# Patient Record
Sex: Female | Born: 1995 | Race: Black or African American | Hispanic: No | Marital: Single | Smoking: Current every day smoker
Health system: Southern US, Community
[De-identification: ages and names within clinical notes are randomized; demographics above are authoritative.]

## PROBLEM LIST (undated history)

## (undated) ENCOUNTER — Ambulatory Visit: Admission: EM | Payer: Medicaid Other

## (undated) ENCOUNTER — Ambulatory Visit (HOSPITAL_COMMUNITY): Admission: EM | Payer: MEDICAID

## (undated) DIAGNOSIS — A6000 Herpesviral infection of urogenital system, unspecified: Secondary | ICD-10-CM

## (undated) DIAGNOSIS — Z8619 Personal history of other infectious and parasitic diseases: Secondary | ICD-10-CM

## (undated) HISTORY — PX: NO PAST SURGERIES: SHX2092

---
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
## 2014-02-12 ENCOUNTER — Telehealth: Payer: Self-pay | Admitting: *Deleted

## 2014-02-12 ENCOUNTER — Ambulatory Visit: Payer: Medicaid Other

## 2014-02-12 NOTE — Telephone Encounter (Signed)
Left voice message for pt stating she will need to schedule an appt with PCP to restart Depo Provera.  Clovis PuMartin, Keidy Thurgood L, RN

## 2014-02-17 ENCOUNTER — Ambulatory Visit: Payer: Medicaid Other | Admitting: Family Medicine

## 2014-02-20 ENCOUNTER — Encounter (HOSPITAL_COMMUNITY): Payer: Self-pay | Admitting: Emergency Medicine

## 2014-02-20 ENCOUNTER — Emergency Department (INDEPENDENT_AMBULATORY_CARE_PROVIDER_SITE_OTHER)
Admission: EM | Admit: 2014-02-20 | Discharge: 2014-02-20 | Disposition: A | Discharge status: Home / Self Care | Payer: Self-pay | Source: Home / Self Care

## 2014-02-20 DIAGNOSIS — N390 Urinary tract infection, site not specified: Secondary | ICD-10-CM

## 2014-02-20 LAB — POCT PREGNANCY, URINE: Preg Test, Ur: NEGATIVE

## 2014-02-20 LAB — POCT URINALYSIS DIP (DEVICE)
BILIRUBIN URINE: NEGATIVE
GLUCOSE, UA: NEGATIVE mg/dL
Ketones, ur: NEGATIVE mg/dL
NITRITE: NEGATIVE
PH: 6 (ref 5.0–8.0)
Protein, ur: NEGATIVE mg/dL
Specific Gravity, Urine: 1.015 (ref 1.005–1.030)
Urobilinogen, UA: 0.2 mg/dL (ref 0.0–1.0)

## 2014-02-20 MED ORDER — CEPHALEXIN 500 MG PO CAPS: 500.0000 mg | ORAL_CAPSULE | Freq: Four times a day (QID) | ORAL | Status: DC

## 2014-02-20 NOTE — ED Notes (Signed)
Pt  Reports  Symptoms  Of  Scanty  Urinary  Output   With  Pain on  Urination   Today        -   Pt  Reports  Symptoms    Similar  To prior  uti   In past

## 2014-02-20 NOTE — Discharge Instructions (Signed)
Take all of medicine as directed, drink lots of fluids, see your doctor if further problems. °

## 2014-02-20 NOTE — ED Provider Notes (Signed)
CSN: [id number]     Arrival date & time 02/20/14  0840 History   None    Chief Complaint  Patient presents with  . Urinary Tract Infection   (Consider location/radiation/quality/duration/timing/severity/associated sxs/prior Treatment) Patient is a 18 y.o. female presenting with urinary tract infection. The history is provided by the patient.  Urinary Tract Infection This is a new problem. The current episode started 3 to 5 hours ago. The problem has not changed since onset.Pertinent negatives include no chest pain, no abdominal pain, no headaches and no shortness of breath.    Past Medical History  Diagnosis Date  . BV (bacterial vaginosis) 11/12/ 2010  . Yeast infection    History reviewed. No pertinent past surgical history. Family History  Problem Relation Age of Onset  . Asthma Mother    History  Substance Use Topics  . Smoking status: Never Smoker   . Smokeless tobacco: Not on file  . Alcohol Use: No   OB History   Grav Para Term Preterm Abortions TAB SAB Ect Mult Living                 Review of Systems  Constitutional: Negative.   Respiratory: Negative for shortness of breath.   Cardiovascular: Negative for chest pain.  Gastrointestinal: Negative.  Negative for abdominal pain.  Genitourinary: Positive for dysuria, urgency and frequency. Negative for flank pain, vaginal discharge and pelvic pain.  Neurological: Negative for headaches.    Allergies  Review of patient's allergies indicates no known allergies.  Home Medications   Prior to Admission medications   Medication Sig Start Date End Date Taking? Authorizing Provider  azithromycin (ZITHROMAX) 500 MG tablet Take 2 tablets (1,000 mg total) by mouth once. Take first 2 tablets together, then 1 every day until finished. 08/16/13   Rodolph Bong, MD  cephALEXin (KEFLEX) 500 MG capsule Take 1 capsule (500 mg total) by mouth 4 (four) times daily. Take all of medicine and drink lots of fluids 02/20/14   Linna Hoff,  MD  fluconazole (DIFLUCAN) 150 MG tablet Take 1 tablet (150 mg total) by mouth once. 11/28/12   Everrett Coombe, DO  medroxyPROGESTERone (DEPO-PROVERA) 150 MG/ML injection Inject 1 mL (150 mg total) into the muscle every 3 (three) months. 03/04/12   Henreitta Leber, PA-C  metroNIDAZOLE (FLAGYL) 500 MG tablet Take 1 tablet (500 mg total) by mouth 2 (two) times daily. 08/13/13   Rodolph Bong, MD  triamcinolone (KENALOG) 0.1 % cream Apply topically 2 (two) times daily. to affected areas     Historical Provider, MD   BP 101/77  Pulse 70  Temp(Src) 97.8 F (36.6 C) (Oral)  Resp 12  SpO2 100% Physical Exam  Nursing note and vitals reviewed. Constitutional: She is oriented to person, place, and time. She appears well-developed and well-nourished.  Neck: Normal range of motion. Neck supple.  Abdominal: Soft. Bowel sounds are normal. She exhibits no mass. There is no tenderness. There is no rebound and no guarding.  Neurological: She is alert and oriented to person, place, and time.  Skin: Skin is warm and dry.    ED Course  Procedures (including critical care time) Labs Review Labs Reviewed  POCT URINALYSIS DIP (DEVICE) - Abnormal; Notable for the following:    Hgb urine dipstick TRACE (*)    Leukocytes, UA MODERATE (*)    All other components within normal limits  POCT PREGNANCY, URINE    Imaging Review No results found.   MDM   1.  UTI (lower urinary tract infection)        Linna HoffJames D Rakisha Pincock, MD 02/20/14 [phone]

## 2014-02-26 ENCOUNTER — Ambulatory Visit: Payer: Self-pay | Admitting: Family Medicine

## 2014-06-16 ENCOUNTER — Emergency Department (INDEPENDENT_AMBULATORY_CARE_PROVIDER_SITE_OTHER)
Admission: EM | Admit: 2014-06-16 | Discharge: 2014-06-16 | Disposition: A | Discharge status: Home / Self Care | Payer: Self-pay | Source: Home / Self Care | Attending: Emergency Medicine | Admitting: Emergency Medicine

## 2014-06-16 ENCOUNTER — Encounter (HOSPITAL_COMMUNITY): Payer: Self-pay | Admitting: Emergency Medicine

## 2014-06-16 DIAGNOSIS — B348 Other viral infections of unspecified site: Secondary | ICD-10-CM

## 2014-06-16 MED ORDER — TRAMADOL HCL 50 MG PO TABS: ORAL_TABLET | ORAL | Status: DC

## 2014-06-16 MED ORDER — IPRATROPIUM BROMIDE 0.06 % NA SOLN: 2.0000 | Freq: Four times a day (QID) | NASAL | Status: DC

## 2014-06-16 NOTE — Discharge Instructions (Signed)

## 2014-06-16 NOTE — ED Provider Notes (Signed)
  Chief Complaint   URI   History of Present Illness   Stacey Rodriguez is a 18 year old female with a 2 day history of cough rectum small amounts of yellow sputum, nasal congestion with a small amount of yellowish drainage, sinus pain, ear congestion, subjective fever, chills, and sweats. She denies any wheezing, chest pain, headache, sore throat, or GI symptoms. She's had no known sick exposures. She tried Alka-Seltzer at home without any improvement.  Review of Systems   Other than as noted above, the patient denies any of the following symptoms: Systemic:  No fevers, chills, sweats, or myalgias. Eye:  No redness or discharge. ENT:  No ear pain, headache, nasal congestion, drainage, sinus pressure, or sore throat. Neck:  No neck pain, stiffness, or swollen glands. Lungs:  No cough, sputum production, hemoptysis, wheezing, chest tightness, shortness of breath or chest pain. GI:  No abdominal pain, nausea, vomiting or diarrhea.  PMFSH   Past medical history, family history, social history, meds, and allergies were reviewed.   Physical exam   Vital signs:  BP 97/63 mmHg  Pulse 104  Temp(Src) 99.2 F (37.3 C) (Oral)  Resp 18  SpO2 100% General:  Alert and oriented.  In no distress.  Skin warm and dry. Eye:  No conjunctival injection or drainage. Lids were normal. ENT:  TMs and canals were normal, without erythema or inflammation.  Nasal mucosa was clear and uncongested, without drainage.  Mucous membranes were moist.  Pharynx was clear with no exudate or drainage.  There were no oral ulcerations or lesions. Neck:  Supple, no adenopathy, tenderness or mass. Lungs:  No respiratory distress.  Lungs were clear to auscultation, without wheezes, rales or rhonchi.  Breath sounds were clear and equal bilaterally.  Heart:  Regular rhythm, without gallops, murmers or rubs. Skin:  Clear, warm, and dry, without rash or lesions.   Assessment     The encounter diagnosis was  Rhinovirus.  There is no evidence of pneumonia, strep throat, sinusitis, otitis media.    Plan    1.  Meds:  The following meds were prescribed:   New Prescriptions   IPRATROPIUM (ATROVENT) 0.06 % NASAL SPRAY    Place 2 sprays into both nostrils 4 (four) times daily.   TRAMADOL (ULTRAM) 50 MG TABLET    1 to 2 tabs every 8 hours as needed for cough.    2.  Patient Education/Counseling:  The patient was given appropriate handouts, self care instructions, and instructed in symptomatic relief.  Instructed to get extra fluids and extra rest.    3.  Follow up:  The patient was told to follow up here if no better in 3 to 4 days, or sooner if becoming worse in any way, and given some red flag symptoms such as increasing fever, difficulty breathing, chest pain, or persistent vomiting which would prompt immediate return.       Reuben Likesavid C Ananda Caya, MD 06/16/14 [phone]

## 2014-06-16 NOTE — ED Notes (Signed)
Onset yesterday of cough and yellow phlegm, today sneezing, runny nose.  Ears hurt when lying down, no sore throat.

## 2014-07-02 ENCOUNTER — Encounter (HOSPITAL_COMMUNITY): Payer: Self-pay | Admitting: Emergency Medicine

## 2014-07-02 DIAGNOSIS — Y998 Other external cause status: Secondary | ICD-10-CM | POA: Insufficient documentation

## 2014-07-02 DIAGNOSIS — Z792 Long term (current) use of antibiotics: Secondary | ICD-10-CM | POA: Insufficient documentation

## 2014-07-02 DIAGNOSIS — Y9301 Activity, walking, marching and hiking: Secondary | ICD-10-CM | POA: Insufficient documentation

## 2014-07-02 DIAGNOSIS — Z79899 Other long term (current) drug therapy: Secondary | ICD-10-CM | POA: Insufficient documentation

## 2014-07-02 DIAGNOSIS — S3992XA Unspecified injury of lower back, initial encounter: Secondary | ICD-10-CM | POA: Insufficient documentation

## 2014-07-02 DIAGNOSIS — S0012XA Contusion of left eyelid and periocular area, initial encounter: Secondary | ICD-10-CM | POA: Insufficient documentation

## 2014-07-02 DIAGNOSIS — Z8619 Personal history of other infectious and parasitic diseases: Secondary | ICD-10-CM | POA: Insufficient documentation

## 2014-07-02 DIAGNOSIS — Y9289 Other specified places as the place of occurrence of the external cause: Secondary | ICD-10-CM | POA: Insufficient documentation

## 2014-07-02 MED ORDER — OXYCODONE-ACETAMINOPHEN 5-325 MG PO TABS
1.0000 | ORAL_TABLET | Freq: Once | ORAL | Status: AC
  Administered 2014-07-02: 1 via ORAL
  Filled 2014-07-02: qty 1
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
## 2014-07-03 ENCOUNTER — Emergency Department (HOSPITAL_COMMUNITY)
Admission: EM | Admit: 2014-07-03 | Discharge: 2014-07-03 | Disposition: A | Discharge status: Home / Self Care | Payer: Self-pay | Attending: Emergency Medicine | Admitting: Emergency Medicine

## 2014-07-03 ENCOUNTER — Emergency Department (HOSPITAL_COMMUNITY): Payer: Self-pay

## 2014-07-03 DIAGNOSIS — H0520 Unspecified exophthalmos: Secondary | ICD-10-CM

## 2014-07-03 DIAGNOSIS — H05232 Hemorrhage of left orbit: Secondary | ICD-10-CM

## 2014-07-03 MED ORDER — HYDROCODONE-ACETAMINOPHEN 5-325 MG PO TABS: 1.0000 | ORAL_TABLET | ORAL | Status: DC | PRN
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
## 2014-07-03 NOTE — Discharge Instructions (Signed)

## 2014-07-03 NOTE — ED Notes (Signed)
CSI at bedside to take photos
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
## 2014-07-03 NOTE — ED Notes (Signed)
[employer] remains at bedside. 

## 2015-04-15 ENCOUNTER — Emergency Department (HOSPITAL_COMMUNITY)
Admission: EM | Admit: 2015-04-15 | Discharge: 2015-04-15 | Disposition: A | Discharge status: Home / Self Care | Payer: Self-pay | Attending: Emergency Medicine | Admitting: Emergency Medicine

## 2015-04-15 ENCOUNTER — Encounter (HOSPITAL_COMMUNITY): Payer: Self-pay | Admitting: General Practice

## 2015-04-15 DIAGNOSIS — Z79899 Other long term (current) drug therapy: Secondary | ICD-10-CM | POA: Insufficient documentation

## 2015-04-15 DIAGNOSIS — Z3202 Encounter for pregnancy test, result negative: Secondary | ICD-10-CM | POA: Insufficient documentation

## 2015-04-15 DIAGNOSIS — N898 Other specified noninflammatory disorders of vagina: Secondary | ICD-10-CM | POA: Insufficient documentation

## 2015-04-15 DIAGNOSIS — N390 Urinary tract infection, site not specified: Secondary | ICD-10-CM | POA: Insufficient documentation

## 2015-04-15 DIAGNOSIS — Z8619 Personal history of other infectious and parasitic diseases: Secondary | ICD-10-CM | POA: Insufficient documentation

## 2015-04-15 LAB — URINE MICROSCOPIC-ADD ON

## 2015-04-15 LAB — URINALYSIS, ROUTINE W REFLEX MICROSCOPIC
BILIRUBIN URINE: NEGATIVE
Glucose, UA: NEGATIVE mg/dL
Ketones, ur: 15 mg/dL — AB
NITRITE: NEGATIVE
PROTEIN: 30 mg/dL — AB
SPECIFIC GRAVITY, URINE: 1.024 (ref 1.005–1.030)
UROBILINOGEN UA: 1 mg/dL (ref 0.0–1.0)
pH: 6 (ref 5.0–8.0)

## 2015-04-15 LAB — WET PREP, GENITAL
CLUE CELLS WET PREP: NONE SEEN
Trich, Wet Prep: NONE SEEN
Yeast Wet Prep HPF POC: NONE SEEN

## 2015-04-15 LAB — I-STAT BETA HCG BLOOD, ED (MC, WL, AP ONLY)

## 2015-04-15 LAB — HIV ANTIBODY (ROUTINE TESTING W REFLEX): HIV Screen 4th Generation wRfx: NONREACTIVE

## 2015-04-15 MED ORDER — ONDANSETRON 4 MG PO TBDP
4.0000 mg | ORAL_TABLET | Freq: Once | ORAL | Status: AC
  Administered 2015-04-15: 4 mg via ORAL
  Filled 2015-04-15: qty 1

## 2015-04-15 MED ORDER — AZITHROMYCIN 250 MG PO TABS
1000.0000 mg | ORAL_TABLET | Freq: Once | ORAL | Status: AC
  Administered 2015-04-15: 1000 mg via ORAL
  Filled 2015-04-15: qty 4

## 2015-04-15 MED ORDER — CEPHALEXIN 250 MG PO CAPS
500.0000 mg | ORAL_CAPSULE | Freq: Once | ORAL | Status: AC
  Administered 2015-04-15: 500 mg via ORAL
  Filled 2015-04-15 (×2): qty 2

## 2015-04-15 MED ORDER — CEFTRIAXONE SODIUM 250 MG IJ SOLR
250.0000 mg | Freq: Once | INTRAMUSCULAR | Status: AC
  Administered 2015-04-15: 250 mg via INTRAMUSCULAR
  Filled 2015-04-15: qty 250

## 2015-04-15 MED ORDER — LIDOCAINE HCL (PF) 1 % IJ SOLN
5.0000 mL | Freq: Once | INTRAMUSCULAR | Status: AC
  Administered 2015-04-15: 5 mL
  Filled 2015-04-15: qty 5

## 2015-04-15 MED ORDER — CEPHALEXIN 500 MG PO CAPS: 500.0000 mg | ORAL_CAPSULE | Freq: Four times a day (QID) | ORAL | Status: DC

## 2015-04-15 NOTE — ED Notes (Signed)
Pt brought in complaining of vaginal pain that started on 04/09/2015. Pt reports thick yellow discharge. Pt denies fever, chills, N/V. Pt last had intercouse on 04/07/15.

## 2015-04-15 NOTE — Discharge Instructions (Signed)
You have been treated here for gonorrhea and chlamydia. Take keflex as directed until gone for your UTI. Refer to attached documents for more information.

## 2015-04-15 NOTE — ED Provider Notes (Signed)
CSN: [id number]     Arrival date & time 04/15/15  0746 History   First MD Initiated Contact with Patient 04/15/15 [phone]     Chief Complaint  Patient presents with  . Vaginal Pain     (Consider location/radiation/quality/duration/timing/severity/associated sxs/prior Treatment) Patient is a 19 y.o. female presenting with vaginal discharge. The history is provided by the patient. No language interpreter was used.  Vaginal Discharge Quality:  Yellow Severity:  Moderate Onset quality:  Gradual Duration:  1 week Timing:  Constant Progression:  Unchanged Chronicity:  New Context: after intercourse   Context: not after urination, not at rest, not during pregnancy and not recent antibiotic use   Relieved by:  Nothing Worsened by:  Nothing tried Ineffective treatments:  None tried Associated symptoms: no abdominal pain, no genital lesions, no nausea, no rash, no urinary hesitancy, no vaginal itching and no vomiting   Risk factors: no endometriosis, no foreign body, no gynecological surgery, no immunosuppression, no new sexual partner, no PID, no prior miscarriage, no STI exposure and no terminated pregnancy     Past Medical History  Diagnosis Date  . BV (bacterial vaginosis) 11/12/ 2010  . Yeast infection    History reviewed. No pertinent past surgical history. Family History  Problem Relation Age of Onset  . Asthma Mother    Social History  Substance Use Topics  . Smoking status: Never Smoker   . Smokeless tobacco: None  . Alcohol Use: 3.0 oz/week    5 Shots of liquor per week   OB History    No data available     Review of Systems  Gastrointestinal: Negative for nausea, vomiting and abdominal pain.  Genitourinary: Positive for vaginal discharge and vaginal pain. Negative for hesitancy.  All other systems reviewed and are negative.     Allergies  Review of patient's allergies indicates no known allergies.  Home Medications   Prior to Admission medications    Medication Sig Start Date End Date Taking? Authorizing Provider  azithromycin (ZITHROMAX) 500 MG tablet Take 2 tablets (1,000 mg total) by mouth once. Take first 2 tablets together, then 1 every day until finished. Patient not taking: Reported on 04/15/2015 08/16/13   Rodolph Bong, MD  cephALEXin (KEFLEX) 500 MG capsule Take 1 capsule (500 mg total) by mouth 4 (four) times daily. Take all of medicine and drink lots of fluids Patient not taking: Reported on 04/15/2015 02/20/14   Linna Hoff, MD  fluconazole (DIFLUCAN) 150 MG tablet Take 1 tablet (150 mg total) by mouth once. Patient not taking: Reported on 04/15/2015 11/28/12   Everrett Coombe, DO  HYDROcodone-acetaminophen (NORCO/VICODIN) 5-325 MG per tablet Take 1 tablet by mouth every 4 (four) hours as needed for moderate pain. Patient not taking: Reported on 04/15/2015 07/03/14   Azalia Bilis, MD  ipratropium (ATROVENT) 0.06 % nasal spray Place 2 sprays into both nostrils 4 (four) times daily. Patient not taking: Reported on 04/15/2015 06/16/14   Reuben Likes, MD  medroxyPROGESTERone (DEPO-PROVERA) 150 MG/ML injection Inject 1 mL (150 mg total) into the muscle every 3 (three) months. Patient not taking: Reported on 04/15/2015 03/04/12   Henreitta Leber, PA-C  metroNIDAZOLE (FLAGYL) 500 MG tablet Take 1 tablet (500 mg total) by mouth 2 (two) times daily. Patient not taking: Reported on 04/15/2015 08/13/13   Rodolph Bong, MD  traMADol (ULTRAM) 50 MG tablet 1 to 2 tabs every 8 hours as needed for cough. Patient not taking: Reported on 04/15/2015 06/16/14   Dineen Kid  Lorenz Coaster, MD  triamcinolone (KENALOG) 0.1 % cream Apply topically 2 (two) times daily. to affected areas     Historical Provider, MD   BP 109/67 mmHg  Pulse 84  Temp(Src) 98.5 F (36.9 C) (Oral)  Resp 16  Ht 5\' 7"  (1.702 m)  Wt 141 lb (63.957 kg)  BMI 22.08 kg/m2  SpO2 98%  LMP  (Within Months) Physical Exam  Constitutional: She is oriented to person, place, and time. She appears well-developed and  well-nourished. No distress.  HENT:  Head: Normocephalic and atraumatic.  Eyes: Conjunctivae and EOM are normal.  Neck: Normal range of motion.  Cardiovascular: Normal rate and regular rhythm.  Exam reveals no gallop and no friction rub.   No murmur heard. Pulmonary/Chest: Effort normal and breath sounds normal. She has no wheezes. She has no rales. She exhibits no tenderness.  Abdominal: Soft. She exhibits no distension. There is no tenderness. There is no rebound.  Genitourinary: Vagina normal.  Moderate amount of thick, yellowish discharge in vagina. Cervical os closed. No CMT.   Musculoskeletal: Normal range of motion.  Neurological: She is alert and oriented to person, place, and time. Coordination normal.  Speech is goal-oriented. Moves limbs without ataxia.   Skin: Skin is warm and dry.  Psychiatric: She has a normal mood and affect. Her behavior is normal.  Nursing note and vitals reviewed.   ED Course  Procedures (including critical care time) Labs Review Labs Reviewed  WET PREP, GENITAL - Abnormal; Notable for the following:    WBC, Wet Prep HPF POC TOO NUMEROUS TO COUNT (*)    All other components within normal limits  URINALYSIS, ROUTINE W REFLEX MICROSCOPIC (NOT AT [facility]) - Abnormal; Notable for the following:    Color, Urine AMBER (*)    APPearance TURBID (*)    Hgb urine dipstick SMALL (*)    Ketones, ur 15 (*)    Protein, ur 30 (*)    Leukocytes, UA LARGE (*)    All other components within normal limits  URINE MICROSCOPIC-ADD ON - Abnormal; Notable for the following:    Squamous Epithelial / LPF MANY (*)    Bacteria, UA MANY (*)    All other components within normal limits  HIV ANTIBODY (ROUTINE TESTING)  I-STAT BETA HCG BLOOD, ED ([facility], [facility], [facility] ONLY)  GC/CHLAMYDIA PROBE AMP ([place]) NOT AT [facility]    Imaging Review No results found. I have personally reviewed and evaluated these images and lab results as part of my medical decision-making.   EKG  Interpretation None      MDM   Final diagnoses:  Vaginal discharge  UTI (lower urinary tract infection)    9:13 AM Pelvic done. Wet prep and urinalysis pending.   10:36 AM Wet prep shows TNTC WBC without other acute changes. Patient will be treated for GC/Chlamydia. Urinalysis shows UTI and patient will be treated with keflex.    Emilia Beck, PA-C 04/15/15 1037  Cathren Laine, MD 04/16/15 [phone]

## 2015-04-16 LAB — GC/CHLAMYDIA PROBE AMP (~~LOC~~) NOT AT ARMC
CHLAMYDIA, DNA PROBE: NEGATIVE
NEISSERIA GONORRHEA: NEGATIVE

## 2015-05-18 ENCOUNTER — Ambulatory Visit: Payer: Self-pay | Admitting: Internal Medicine

## 2015-05-28 ENCOUNTER — Ambulatory Visit: Payer: Self-pay | Admitting: Internal Medicine

## 2015-06-26 ENCOUNTER — Encounter (HOSPITAL_COMMUNITY): Payer: Self-pay | Admitting: Emergency Medicine

## 2015-06-26 ENCOUNTER — Emergency Department (HOSPITAL_COMMUNITY)
Admission: EM | Admit: 2015-06-26 | Discharge: 2015-06-26 | Disposition: A | Discharge status: Home / Self Care | Payer: Medicaid Other | Attending: Emergency Medicine | Admitting: Emergency Medicine

## 2015-06-26 DIAGNOSIS — B373 Candidiasis of vulva and vagina: Secondary | ICD-10-CM | POA: Insufficient documentation

## 2015-06-26 DIAGNOSIS — B3731 Acute candidiasis of vulva and vagina: Secondary | ICD-10-CM

## 2015-06-26 DIAGNOSIS — Z792 Long term (current) use of antibiotics: Secondary | ICD-10-CM | POA: Insufficient documentation

## 2015-06-26 DIAGNOSIS — Z793 Long term (current) use of hormonal contraceptives: Secondary | ICD-10-CM | POA: Insufficient documentation

## 2015-06-26 DIAGNOSIS — Z3202 Encounter for pregnancy test, result negative: Secondary | ICD-10-CM | POA: Insufficient documentation

## 2015-06-26 LAB — WET PREP, GENITAL
Clue Cells Wet Prep HPF POC: NONE SEEN
Trich, Wet Prep: NONE SEEN

## 2015-06-26 LAB — URINALYSIS, ROUTINE W REFLEX MICROSCOPIC
Bilirubin Urine: NEGATIVE
GLUCOSE, UA: NEGATIVE mg/dL
Hgb urine dipstick: NEGATIVE
Ketones, ur: NEGATIVE mg/dL
Nitrite: NEGATIVE
PROTEIN: NEGATIVE mg/dL
Specific Gravity, Urine: 1.018 (ref 1.005–1.030)
UROBILINOGEN UA: 0.2 mg/dL (ref 0.0–1.0)
pH: 5.5 (ref 5.0–8.0)

## 2015-06-26 LAB — PREGNANCY, URINE: Preg Test, Ur: NEGATIVE

## 2015-06-26 LAB — URINE MICROSCOPIC-ADD ON

## 2015-06-26 MED ORDER — FLUCONAZOLE 100 MG PO TABS
150.0000 mg | ORAL_TABLET | Freq: Every day | ORAL | Status: DC
  Administered 2015-06-26: 150 mg via ORAL
  Filled 2015-06-26: qty 2

## 2015-06-26 NOTE — ED Notes (Signed)
Pt remains monitored by blood pressure and pulse ox.  

## 2015-06-26 NOTE — ED Notes (Signed)
Vaginal discharge for two days and vaginal itching. Wants to be checked for STDs.

## 2015-06-26 NOTE — Discharge Instructions (Signed)
Please follow up with a primary care provider from the Resource Guide provided below in 1 week. Please return to the Emergency Department if symptoms worsen or new onset of fever, abdominal pain, vaginal bleeding, vaginal discharge, vomiting.    Emergency Department Resource Guide 1) Find a Doctor and Pay Out of Pocket Although you won't have to find out who is covered by your insurance plan, it is a good idea to ask around and get recommendations. You will then need to call the office and see if the doctor you have chosen will accept you as a new patient and what types of options they offer for patients who are self-pay. Some doctors offer discounts or will set up payment plans for their patients who do not have insurance, but you will need to ask so you aren't surprised when you get to your appointment.  2) Contact Your Local Health Department Not all health departments have doctors that can see patients for sick visits, but many do, so it is worth a call to see if yours does. If you don't know where your local health department is, you can check in your phone book. The CDC also has a tool to help you locate your state's health department, and many state websites also have listings of all of their local health departments.  3) Find a Walk-in Clinic If your illness is not likely to be very severe or complicated, you may want to try a walk in clinic. These are popping up all over the country in pharmacies, drugstores, and shopping centers. They're usually staffed by nurse practitioners or physician assistants that have been trained to treat common illnesses and complaints. They're usually fairly quick and inexpensive. However, if you have serious medical issues or chronic medical problems, these are probably not your best option.  No Primary Care Doctor: - Call Health Connect at  [phone] - they can help you locate a primary care doctor that  accepts your insurance, provides certain services,  etc. - Physician Referral Service- [phone]  Chronic Pain Problems: Organization         Address  Phone   Notes  [facility]  [phone] Patients need to be referred by their primary care doctor.   Medication Assistance: Organization         Address  Phone   Notes  [facility] [address] [place], [place] [address] [phone] --Must be a resident of [facility] [place] -- Must have NO insurance coverage whatsoever (no Medicaid/ Medicare, etc.) -- The pt. MUST have a primary care doctor that directs their care regularly and follows them in the community   MedAssist  [phone]   [employer]  [phone]    Agencies that provide inexpensive medical care: Organization         Address  Phone   Notes  [facility]  [phone]   [facility]    [phone]   [facility] [address] [place], [place] [address] [phone]   [facility] [address], [place] [phone]   [facility]anned Parenthood    [phone]   [facility]    [phone]   [facility]  [address], [place] Phone:  [phone], Fax:  [phone] Hours of Operation:  9 am - 6 pm, M-F.  Also accepts Medicaid/Medicare and self-pay.  [facility] [place] [address], [address], [place] Phone: [phone], Fax: [phone]. Hours of Operation:  8:30 am - 5:30 pm, M-F.  Also accepts Medicaid and self-pay.  [facility] [address] [place] Phone: [phone]   [place], [place], [place] [phone], Ext. [phone] Mondays & Thursdays: 7-9 AM.  First 15 patients are seen on a first come, first serve basis.    [place] Providers:  Organization         Address  Phone   Notes  [facility] [address], [place] [phone] Also accepts self-pay patients.  [facility] [address], [place]  [phone]   [place], [address], [place] [phone]   [facility] [address], [place] [phone]   Lucianne Lei [address], [place]   [phone] Only accepts [place] Access [place] patients after they have their name applied to their card.   Self-Pay (no insurance) in [facility]:  Organization         Address  Phone   Notes  Sickle Cell Patients, [facility] [place] [phone]   [facility] [place] [phone]   [facility] [place]  [place], [place], [place] [phone]   [facility]/Dr. Osei-Bonsu  [address] [place] or [address], [place] [phone] Phone number for both [place] and [place] locations is the same.  Urgent Medical and [facility] [address], [place] [phone]   [facility] [address], [place] or [address] [phone]   [facility] [address] [place] [phone], phone; [phone], fax Sees patients 1st and 3rd Saturday of every month.  Must not qualify for public or private insurance (i.e. Medicaid, Medicare, [place] Health Choice, Veterans' Benefits)  Household income should be no more than 200% of the poverty level The clinic cannot treat you if you are pregnant or think you are pregnant  Sexually transmitted diseases are not treated at the clinic.    Dental Care: Organization         Address  Phone  Notes  [facility] of [facility] [place] [phone] Accepts children up to  age 78 who are enrolled in [place] or [place]; pregnant women with a Medicaid card; and children who have applied for Medicaid or [place] Health Choice, but were declined, whose parents can pay a reduced fee at time of service.  [facility] of [facility]  [address], [place] [phone] Accepts children up to age 39 who are enrolled in [place] or [place]; pregnant women with a Medicaid card; and children who have applied for Medicaid or [place] Health Choice, but were declined, whose parents can pay a reduced fee at time of service.  [facility] Access PROGRAM  [place] [phone] Patients are seen by appointment only. Walk-ins are not accepted. [place] will see patients 78 years of age and older. Monday - Tuesday (8am-5pm) Most Wednesdays (8:30-5pm) $30 per  visit, cash only  [facility] Adult [employer] PROGRAM  [address], [facility] [phone] Patients are seen by appointment only. Walk-ins are not accepted. [place] will see patients 25 years of age and older. One Wednesday Evening (Monthly: Volunteer Based).  $30 per visit, cash only  [place]  [phone] for adults; Children under age 88, call Graduate Pediatric Dentistry at [phone]. Children aged 53-14, please call [phone] to request a pediatric application.  Dental services are provided in all areas of dental care including fillings, crowns and bridges, complete and partial dentures, implants, gum treatment, root canals, and extractions. Preventive care is also provided. Treatment is provided to both adults and children. Patients are selected via a lottery and there is often a waiting list.   [facility] [address] [place]  [phone] [email or web address]   [facility] [address] [place], [place] [phone], Ext. [phone] Second and Fourth Thursday of  each month, opens at 6:30 AM; Clinic ends at 9 AM.  Patients are seen on a first-come first-served basis, and a limited number are seen during each clinic.   [facility]  [address] Hillard Danker [place], [place] [phone]   Eligibility Requirements You must have lived in [place], [place], or [place] counties for at least the last three months.   You cannot be eligible for state or federal sponsored [employer], including [employer], [place], or [employer].   You generally cannot be eligible for healthcare insurance through your employer.    How to apply: Eligibility screenings are held every Tuesday and Wednesday afternoon from 1:00 pm until 4:00 pm. You do not need an appointment for the interview!  [facility] [address], [place], [place]   [place]  [place] Department  [place]  [phone]    Behavioral Health Resources in the Community: Intensive Outpatient Programs Organization         Address  Phone  Notes  [place] [address] [place], [place] [phone]   [facility] [address] [place], [place]   ADS: Alcohol & Drug Svcs [address] [place], [place]   [place] [address],  [place], [place] or [phone]   Substance Abuse Resources Organization         Address  Phone  Notes  Alcohol and Drug Services  [phone]   [place]  [phone]   [facility] [place]   Chinita Pester  [phone]   Residential & Outpatient Substance Abuse Program  [phone]   Psychological Services Organization         Address  Phone  Notes  [facility]  [place]  [phone]   [place] [address],  [place] or [phone]    Mobile Crisis Teams Organization         Address  Phone  Notes  [facility], Mobile Crisis Care Unit  [phone]   [facility]  [address] [place], [place]   [place] [address], [place] [phone]    Self-Help/Support Groups Organization         Address  Phone  Notes  [facility] [place] - variety of support groups  [phone]- [phone] Call for more information  Narcotics Anonymous (NA), [facility] [address], [employer] [place]  2 meetings at this location   Statistician         Address  Phone  Notes  [facility] [address],    [place]  [phone]   [facility]  [address], [place] [address], [place], [place] [phone]   [facility] [address] [place] [address] [place], [place] [phone] Admissions: 8am-3pm M-F  [facility] [address]    [place], [place] [phone]   [facility] [address] [place], [place], [place] [phone]   [facility] [address],  [place], [place] [phone]   [facility] - Intensive Outpatient [address]urell [address], [place], [place] [phone]   [facility] (Addiction Recovery Care [facility]oc[facility] [address] [place]  [place], [place] [phone] or [phone]   [facility] Services (RTS) [address] [place], [place] [phone] Accepts Medicaid  [facility] [place] [address],  [place] [phone] Substance Abuse/Addiction Treatment   [facility] Organization         Address  Phone  Notes  [facility]  [phone]   Angie Fava, PhD [address] Ervin Knack [place], [place]   [phone] or [phone]   [facility] Behavioral   [address] [place], [place] [phone]     [facility] [address], [place], [place] [phone] Insurance/Medicaid/sponsorship through [facility] [address]                                    [place], [place] [phone] Therapy/tele-psych/case  [facility] [address], [place] [phone]    Dr. Lolly Mustache  [phone]   [facility]  [employer] [facility] Dept. 1) [address] [place] 2) [address], [place] 3)  [address], [place] [phone]  [phone]   [facility] Child Abuse Hotline [phone]) [phone] or [phone] (After Hours)

## 2015-06-26 NOTE — ED Provider Notes (Signed)
CSN: [id number]     Arrival date & time 06/26/15  0957 History   First MD Initiated Contact with Patient 06/26/15 1023     Chief Complaint  Patient presents with  . Vaginal Discharge     (Consider location/radiation/quality/duration/timing/severity/associated sxs/prior Treatment) HPI Comments: Patient is a 19 year old female who presents the ED with complaint of vaginal discharge, onset 2 days. Patient states she was treated a week ago for BV and notes that she did not finish her prescription of Flagyl. She states she was diagnosed with BV she also had vaginal discharge but states that her discharge today is different. Patient also endorses having vaginal itching. Denies fever, chills, abdominal pain, nausea, vomiting, vaginal bleeding, urinary symptoms, diarrhea. Denies any prior abdominal surgeries. G0P0. LMP 2 months ago, pt on Depo shot. Patient reports being sexually active and states that she does not use condoms. She notes she wants to be checked for STDs.   Past Medical History  Diagnosis Date  . BV (bacterial vaginosis) 11/12/ 2010  . Yeast infection    History reviewed. No pertinent past surgical history. Family History  Problem Relation Age of Onset  . Asthma Mother    Social History  Substance Use Topics  . Smoking status: Never Smoker   . Smokeless tobacco: None  . Alcohol Use: 3.0 oz/week    5 Shots of liquor per week   OB History    No data available     Review of Systems  Genitourinary: Positive for vaginal discharge.       Vaginal itching  All other systems reviewed and are negative.     Allergies  Review of patient's allergies indicates no known allergies.  Home Medications   Prior to Admission medications   Medication Sig Start Date End Date Taking? Authorizing Provider  medroxyPROGESTERone (DEPO-PROVERA) 150 MG/ML injection Inject 1 mL (150 mg total) into the muscle every 3 (three) months. 03/04/12  Yes Henreitta LeberElmira Powell, PA-C  azithromycin  (ZITHROMAX) 500 MG tablet Take 2 tablets (1,000 mg total) by mouth once. Take first 2 tablets together, then 1 every day until finished. Patient not taking: Reported on 04/15/2015 08/16/13   Rodolph BongEvan S Corey, MD  cephALEXin (KEFLEX) 500 MG capsule Take 1 capsule (500 mg total) by mouth 4 (four) times daily. 04/15/15   Kaitlyn Szekalski, PA-C  fluconazole (DIFLUCAN) 150 MG tablet Take 1 tablet (150 mg total) by mouth once. Patient not taking: Reported on 04/15/2015 11/28/12   Everrett Coombeody Matthews, DO  HYDROcodone-acetaminophen (NORCO/VICODIN) 5-325 MG per tablet Take 1 tablet by mouth every 4 (four) hours as needed for moderate pain. Patient not taking: Reported on 04/15/2015 07/03/14   Azalia BilisKevin Campos, MD  ipratropium (ATROVENT) 0.06 % nasal spray Place 2 sprays into both nostrils 4 (four) times daily. Patient not taking: Reported on 04/15/2015 06/16/14   Reuben Likesavid C Keller, MD  metroNIDAZOLE (FLAGYL) 500 MG tablet Take 1 tablet (500 mg total) by mouth 2 (two) times daily. Patient not taking: Reported on 04/15/2015 08/13/13   Rodolph BongEvan S Corey, MD  traMADol (ULTRAM) 50 MG tablet 1 to 2 tabs every 8 hours as needed for cough. Patient not taking: Reported on 04/15/2015 06/16/14   Reuben Likesavid C Keller, MD   BP 120/81 mmHg  Pulse 73  Temp(Src) 98.6 F (37 C) (Oral)  Resp 18  Ht 5\' 7"  (1.702 m)  Wt 138 lb (62.596 kg)  BMI 21.61 kg/m2  SpO2 99% Physical Exam  Constitutional: She is oriented to person, place, and time. She  appears well-developed and well-nourished. No distress.  HENT:  Head: Normocephalic and atraumatic.  Mouth/Throat: Oropharynx is clear and moist. No oropharyngeal exudate.  Eyes: Conjunctivae and EOM are normal. Right eye exhibits no discharge. Left eye exhibits no discharge. No scleral icterus.  Neck: Normal range of motion. Neck supple.  Cardiovascular: Normal rate, regular rhythm, normal heart sounds and intact distal pulses.   Pulmonary/Chest: Effort normal and breath sounds normal. No respiratory distress.   Abdominal: Soft. Bowel sounds are normal. She exhibits no distension and no mass. There is no tenderness. There is no rebound and no guarding.  Musculoskeletal: Normal range of motion. She exhibits no edema.  Lymphadenopathy:    She has no cervical adenopathy.  Neurological: She is alert and oriented to person, place, and time.  Skin: Skin is warm and dry. She is not diaphoretic.  Nursing note and vitals reviewed.   ED Course  Procedures (including critical care time) Labs Review Labs Reviewed  WET PREP, GENITAL - Abnormal; Notable for the following:    Yeast Wet Prep HPF POC RARE (*)    WBC, Wet Prep HPF POC MANY (*)    All other components within normal limits  URINALYSIS, ROUTINE W REFLEX MICROSCOPIC (NOT AT [facility]) - Abnormal; Notable for the following:    Leukocytes, UA MODERATE (*)    All other components within normal limits  URINE MICROSCOPIC-ADD ON - Abnormal; Notable for the following:    Squamous Epithelial / LPF FEW (*)    All other components within normal limits  PREGNANCY, URINE  HIV ANTIBODY (ROUTINE TESTING)  GC/CHLAMYDIA PROBE AMP ([place]) NOT AT [facility]    Imaging Review No results found. I have personally reviewed and evaluated these images and lab results as part of my medical decision-making.  Pelvic exam: normal external genitalia, vulva, vagina, cervix, uterus and adnexa, VULVA: normal appearing vulva with no masses, tenderness or lesions, VAGINA: normal appearing vagina with normal color and discharge, no lesions, vaginal discharge - white and curd-like, WET MOUNT done - results: yeast, DNA probe for chlamydia and GC obtained, CERVIX: normal appearing cervix without discharge or lesions, UTERUS: uterus is normal size, shape, consistency and nontender, ADNEXA: normal adnexa in size, nontender and no masses, exam chaperoned by female tech.   Filed Vitals:   06/26/15 1220  BP: 120/81  Pulse: 73  Temp:   Resp: 18     MDM   Final diagnoses:  Vaginal  yeast infection    Patient presents with vaginal discharge and itching. She notes she was recently diagnosed and treated for BV but states she did not finish her antibiotic dose. VSS. Exam unremarkable. Pelvic exam revealed white curd-like discharge in vaginal vault, no CMT, no adnexal tenderness. Wet prep positive for yeast. STD tests performed. Patient given Diflucan in the ED. Plan to discharge charge home. Patient given resource guide to follow up with PCP.    Satira Sark [place], [place] 06/26/15 1507  Tilden Fossa, MD 06/27/15 [phone]

## 2015-06-26 NOTE — ED Notes (Signed)
Pts vital signs updated. Pt getting dressed and awaiting discharge paperwork at bedside.  

## 2015-06-27 LAB — HIV ANTIBODY (ROUTINE TESTING W REFLEX): HIV SCREEN 4TH GENERATION: NONREACTIVE

## 2015-06-28 LAB — GC/CHLAMYDIA PROBE AMP (~~LOC~~) NOT AT ARMC
Chlamydia: NEGATIVE
NEISSERIA GONORRHEA: NEGATIVE

## 2015-07-18 ENCOUNTER — Encounter (HOSPITAL_COMMUNITY): Payer: Self-pay | Admitting: Emergency Medicine

## 2015-07-18 ENCOUNTER — Emergency Department (HOSPITAL_COMMUNITY)
Admission: EM | Admit: 2015-07-18 | Discharge: 2015-07-18 | Disposition: A | Discharge status: Home / Self Care | Payer: Medicaid Other | Attending: Physician Assistant | Admitting: Physician Assistant

## 2015-07-18 DIAGNOSIS — Z792 Long term (current) use of antibiotics: Secondary | ICD-10-CM | POA: Insufficient documentation

## 2015-07-18 DIAGNOSIS — Z3202 Encounter for pregnancy test, result negative: Secondary | ICD-10-CM | POA: Insufficient documentation

## 2015-07-18 DIAGNOSIS — Z8619 Personal history of other infectious and parasitic diseases: Secondary | ICD-10-CM | POA: Insufficient documentation

## 2015-07-18 DIAGNOSIS — N898 Other specified noninflammatory disorders of vagina: Secondary | ICD-10-CM | POA: Insufficient documentation

## 2015-07-18 LAB — URINALYSIS, ROUTINE W REFLEX MICROSCOPIC
BILIRUBIN URINE: NEGATIVE
GLUCOSE, UA: NEGATIVE mg/dL
HGB URINE DIPSTICK: NEGATIVE
Ketones, ur: NEGATIVE mg/dL
Nitrite: NEGATIVE
PH: 6.5 (ref 5.0–8.0)
Protein, ur: NEGATIVE mg/dL
SPECIFIC GRAVITY, URINE: 1.02 (ref 1.005–1.030)

## 2015-07-18 LAB — URINE MICROSCOPIC-ADD ON

## 2015-07-18 LAB — WET PREP, GENITAL
Clue Cells Wet Prep HPF POC: NONE SEEN
SPERM: NONE SEEN
Trich, Wet Prep: NONE SEEN
YEAST WET PREP: NONE SEEN

## 2015-07-18 LAB — PREGNANCY, URINE: Preg Test, Ur: NEGATIVE

## 2015-07-18 NOTE — ED Notes (Signed)
PA at the bedside.

## 2015-07-18 NOTE — ED Notes (Signed)
Pt. Stated, Im having a discharge and a smell it started 3 days ago, I've not been exposed to STD. I want to be checked for everything.

## 2015-07-18 NOTE — ED Notes (Signed)
Pelvic Cart at the bedside  

## 2015-07-18 NOTE — ED Provider Notes (Signed)
CSN: [id number]     Arrival date & time 07/18/15  0722 History   First MD Initiated Contact with Patient 07/18/15 0813     Chief Complaint  Patient presents with  . SEXUALLY TRANSMITTED DISEASE     (Consider location/radiation/quality/duration/timing/severity/associated sxs/prior Treatment) HPI   Patient is a 19 year old female who presents to the ED with complaint of vaginal discharge, onset 3 days. Patient has been seen multiple times in the ED over the past month regarding similar complaint. She has been treated twice for BV. During her last ED visit on 11/12, negative HIV, gonorrhea and chlamydia. Patient reports she finished taking her last prescription of Flagyl a few weeks ago. She notes her vaginal discharge initially resolved but notes it return approximately 3 days ago. She reports having yellow, malodorous vaginal discharge. Denies fever, chills, abdominal pain, nausea, vomiting, diarrhea, hematuria, diarrhea, rash/genital lesions, pelvic pain, vaginal itching. Endorses mild discomfort with urination intermittently. Denies any prior abdominal surgeries. G0P0. LMP 07/14/15 x3days, patient on [employer] Pound[employer]orah shot. Patient reports being sexually active with one partner but denies use of condoms. Patient's partner denies any symptoms or complaints.   Past Medical History  Diagnosis Date  . BV (bacterial vaginosis) 11/12/ 2010  . Yeast infection    History reviewed. No pertinent past surgical history. Family History  Problem Relation Age of Onset  . Asthma Mother    Social History  Substance Use Topics  . Smoking status: Never Smoker   . Smokeless tobacco: None  . Alcohol Use: 3.0 oz/week    5 Shots of liquor per week   OB History    No data available     Review of Systems  Genitourinary: Positive for dysuria and vaginal discharge.  All other systems reviewed and are negative.     Allergies  Review of patient's allergies indicates no known allergies.  Home Medications    Prior to Admission medications   Medication Sig Start Date End Date Taking? Authorizing Provider  azithromycin (ZITHROMAX) 500 MG tablet Take 2 tablets (1,000 mg total) by mouth once. Take first 2 tablets together, then 1 every day until finished. Patient not taking: Reported on 04/15/2015 08/16/13   Rodolph BongEvan S Corey, MD  cephALEXin (KEFLEX) 500 MG capsule Take 1 capsule (500 mg total) by mouth 4 (four) times daily. 04/15/15   Kaitlyn Szekalski, PA-C  fluconazole (DIFLUCAN) 150 MG tablet Take 1 tablet (150 mg total) by mouth once. Patient not taking: Reported on 04/15/2015 11/28/12   Everrett Coombeody Matthews, DO  HYDROcodone-acetaminophen (NORCO/VICODIN) 5-325 MG per tablet Take 1 tablet by mouth every 4 (four) hours as needed for moderate pain. Patient not taking: Reported on 04/15/2015 07/03/14   Azalia BilisKevin Campos, MD  ipratropium (ATROVENT) 0.06 % nasal spray Place 2 sprays into both nostrils 4 (four) times daily. Patient not taking: Reported on 04/15/2015 06/16/14   Reuben Likesavid C Keller, MD  medroxyPROGESTERone (DEPO-PROVERA) 150 MG/ML injection Inject 1 mL (150 mg total) into the muscle every 3 (three) months. 03/04/12   Henreitta LeberElmira Powell, PA-C  metroNIDAZOLE (FLAGYL) 500 MG tablet Take 1 tablet (500 mg total) by mouth 2 (two) times daily. Patient not taking: Reported on 04/15/2015 08/13/13   Rodolph BongEvan S Corey, MD  traMADol (ULTRAM) 50 MG tablet 1 to 2 tabs every 8 hours as needed for cough. Patient not taking: Reported on 04/15/2015 06/16/14   Reuben Likesavid C Keller, MD   BP 94/54 mmHg  Pulse 85  Temp(Src) 98.5 F (36.9 C) (Oral)  Resp 16  Ht 5\' 7"  (  1.702 m)  Wt 63.504 kg  BMI 21.92 kg/m2  SpO2 97% Physical Exam  Constitutional: She is oriented to person, place, and time. She appears well-developed and well-nourished. No distress.  HENT:  Head: Normocephalic and atraumatic.  Eyes: Conjunctivae and EOM are normal. Right eye exhibits no discharge. Left eye exhibits no discharge. No scleral icterus.  Neck: Normal range of motion. Neck  supple.  Cardiovascular: Normal rate, regular rhythm, normal heart sounds and intact distal pulses.   Pulmonary/Chest: Effort normal and breath sounds normal. No respiratory distress.  Abdominal: Soft. Bowel sounds are normal. She exhibits no distension and no mass. There is no tenderness. There is no rebound and no guarding.  Musculoskeletal: Normal range of motion. She exhibits no edema.  Lymphadenopathy:    She has no cervical adenopathy.  Neurological: She is alert and oriented to person, place, and time.  Skin: Skin is warm and dry.  Nursing note and vitals reviewed.   ED Course  Procedures (including critical care time) Labs Review Labs Reviewed  WET PREP, GENITAL - Abnormal; Notable for the following:    WBC, Wet Prep HPF POC MANY (*)    All other components within normal limits  URINALYSIS, ROUTINE W REFLEX MICROSCOPIC (NOT AT [facility]) - Abnormal; Notable for the following:    Leukocytes, UA SMALL (*)    All other components within normal limits  URINE MICROSCOPIC-ADD ON - Abnormal; Notable for the following:    Squamous Epithelial / LPF 0-5 (*)    Bacteria, UA RARE (*)    All other components within normal limits  PREGNANCY, URINE  GC/CHLAMYDIA PROBE AMP ([place]) NOT AT [facility]    Imaging Review No results found. I have personally reviewed and evaluated these images and lab results as part of my medical decision-making.  Pelvic exam: normal external genitalia, vulva, vagina, cervix, uterus and adnexa, VULVA: normal appearing vulva with no masses, tenderness or lesions, VAGINA: normal appearing vagina with normal color and discharge, no lesions, vaginal discharge - white, malodorous and milky, WET MOUNT done - results: white blood cells, DNA probe for chlamydia and GC obtained, CERVIX: normal appearing cervix without discharge or lesions, UTERUS: uterus is normal size, shape, consistency and nontender, ADNEXA: normal adnexa in size, nontender and no masses, exam chaperoned by  female nurse.   Filed Vitals:   07/18/15 0930 07/18/15 1000  BP: 112/69 94/54  Pulse: 84 85  Temp:    Resp:       MDM   Final diagnoses:  Vaginal discharge    Patient presents with vaginal discharge. She has been seen in the ED multiple times for similar complaint and has been treated twice over the past 2 months for BV. During her last ED visit on 11/12 patient was diagnosed with BV, negative gonorrhea/chlamydia, negative HIV. Patient states she has a scheduled appointment with a gynecologist for tomorrow. VSS. Abdominal exam unremarkable. Pelvic exam revealed white milky discharge, no CMT or adnexal tenderness. Pregnancy negative. UA unremarkable. Gonorrhea/chlamydia pending. Discussed results with patient. At that time she reports using multiple soaps to complain her pelvic region. Discussed proper genital hygiene with patient. No evidence of an acute infection requiring antibiotics at this time, I suspect pt's vaginal d/c is likely normal physiologic vaginal d/c. Plan to discharge patient home. Advised patient to follow up at her gynecology appointment scheduled for tomorrow.  Evaluation does not show pathology requring ongoing emergent intervention or admission. Pt is hemodynamically stable and mentating appropriately. All questions answered. Return  precautions discussed and outpatient follow up given.      Satira Sark Watts, [place] 07/18/15 1428  Courteney Randall An, MD 07/18/15 1555

## 2015-07-18 NOTE — Discharge Instructions (Signed)
Refrain from vaginal douching or placing soap products within your vagina. Follow up with your Gynecologist at your scheduled appointment tomorrow.  Please return to the Emergency Department if symptoms worsen or new onset of fever, abdominal pain, vomiting, vaginal bleeding, blood in urine or stool.

## 2015-07-19 LAB — GC/CHLAMYDIA PROBE AMP (~~LOC~~) NOT AT ARMC
Chlamydia: NEGATIVE
Neisseria Gonorrhea: NEGATIVE

## 2015-07-20 ENCOUNTER — Encounter (HOSPITAL_COMMUNITY): Payer: Self-pay | Admitting: Emergency Medicine

## 2015-07-20 ENCOUNTER — Emergency Department (HOSPITAL_COMMUNITY)
Admission: EM | Admit: 2015-07-20 | Discharge: 2015-07-20 | Disposition: A | Discharge status: Home / Self Care | Payer: Medicaid Other | Attending: Emergency Medicine | Admitting: Emergency Medicine

## 2015-07-20 DIAGNOSIS — A6009 Herpesviral infection of other urogenital tract: Secondary | ICD-10-CM | POA: Insufficient documentation

## 2015-07-20 DIAGNOSIS — Z792 Long term (current) use of antibiotics: Secondary | ICD-10-CM | POA: Insufficient documentation

## 2015-07-20 DIAGNOSIS — Z3202 Encounter for pregnancy test, result negative: Secondary | ICD-10-CM | POA: Insufficient documentation

## 2015-07-20 MED ORDER — ACYCLOVIR 400 MG PO TABS: 400.0000 mg | ORAL_TABLET | Freq: Four times a day (QID) | ORAL | Status: DC

## 2015-07-20 MED ORDER — TRAMADOL HCL 50 MG PO TABS: 50.0000 mg | ORAL_TABLET | Freq: Four times a day (QID) | ORAL | Status: DC | PRN

## 2015-07-20 NOTE — ED Provider Notes (Signed)
CSN: [id number]     Arrival date & time 07/20/15  0751 History   First MD Initiated Contact with Patient 07/20/15 [phone]     Chief Complaint  Patient presents with  . Exposure to STD     (Consider location/radiation/quality/duration/timing/severity/associated sxs/prior Treatment) HPI Comments: Patient presents to the emergency department with chief complaint of general sores. Patient states that she has had small painful bumps develop on her external genitalia over the past 2 days. She states they're very tender to palpation. She denies any discharge drainage. Denies any vaginal discharge or bleeding. She denies any associated fevers, chills, or abdominal pain. She has never had symptoms like this before. The pain is worsened with palpation. She has not tried taking anything for her symptoms. She was seen 2 days ago and had a pelvic exam. She has not received the results from her recent pelvic.  The history is provided by the patient. No language interpreter was used.    Past Medical History  Diagnosis Date  . BV (bacterial vaginosis) 11/12/ 2010  . Yeast infection    History reviewed. No pertinent past surgical history. Family History  Problem Relation Age of Onset  . Asthma Mother    Social History  Substance Use Topics  . Smoking status: Never Smoker   . Smokeless tobacco: None  . Alcohol Use: No   OB History    No data available     Review of Systems  Constitutional: Negative for fever and chills.  Respiratory: Negative for shortness of breath.   Cardiovascular: Negative for chest pain.  Gastrointestinal: Negative for nausea, vomiting, diarrhea and constipation.  Genitourinary: Positive for vaginal pain. Negative for dysuria.  All other systems reviewed and are negative.     Allergies  Review of patient's allergies indicates no known allergies.  Home Medications   Prior to Admission medications   Medication Sig Start Date End Date Taking? Authorizing Provider   acyclovir (ZOVIRAX) 400 MG tablet Take 1 tablet (400 mg total) by mouth 4 (four) times daily. 07/20/15   Roxy Horseman, PA-C  azithromycin (ZITHROMAX) 500 MG tablet Take 2 tablets (1,000 mg total) by mouth once. Take first 2 tablets together, then 1 every day until finished. Patient not taking: Reported on 04/15/2015 08/16/13   Rodolph Bong, MD  cephALEXin (KEFLEX) 500 MG capsule Take 1 capsule (500 mg total) by mouth 4 (four) times daily. 04/15/15   Kaitlyn Szekalski, PA-C  fluconazole (DIFLUCAN) 150 MG tablet Take 1 tablet (150 mg total) by mouth once. Patient not taking: Reported on 04/15/2015 11/28/12   Everrett Coombe, DO  HYDROcodone-acetaminophen (NORCO/VICODIN) 5-325 MG per tablet Take 1 tablet by mouth every 4 (four) hours as needed for moderate pain. Patient not taking: Reported on 04/15/2015 07/03/14   Azalia Bilis, MD  ipratropium (ATROVENT) 0.06 % nasal spray Place 2 sprays into both nostrils 4 (four) times daily. Patient not taking: Reported on 04/15/2015 06/16/14   Reuben Likes, MD  medroxyPROGESTERone (DEPO-PROVERA) 150 MG/ML injection Inject 1 mL (150 mg total) into the muscle every 3 (three) months. 03/04/12   Henreitta Leber, PA-C  metroNIDAZOLE (FLAGYL) 500 MG tablet Take 1 tablet (500 mg total) by mouth 2 (two) times daily. Patient not taking: Reported on 04/15/2015 08/13/13   Rodolph Bong, MD  traMADol (ULTRAM) 50 MG tablet Take 1 tablet (50 mg total) by mouth every 6 (six) hours as needed. 07/20/15   Roxy Horseman, PA-C   BP 119/80 mmHg  Pulse 80  Temp(Src)  98 F (36.7 C) (Oral)  Resp 20  SpO2 96% Physical Exam  Constitutional: She is oriented to person, place, and time. She appears well-developed and well-nourished.  HENT:  Head: Normocephalic and atraumatic.  Eyes: Conjunctivae and EOM are normal.  Neck: Normal range of motion.  Cardiovascular: Normal rate.   Pulmonary/Chest: Effort normal.  Abdominal: She exhibits no distension.  Genitourinary:  Chaperone present during  exam, 3-4 small vesicles on an erythematous base on the right labia consistent with herpes, no abscess  Musculoskeletal: Normal range of motion.  Neurological: She is alert and oriented to person, place, and time.  Skin: Skin is dry.  Psychiatric: She has a normal mood and affect. Her behavior is normal. Judgment and thought content normal.  Nursing note and vitals reviewed.   ED Course  Procedures (including critical care time)   MDM   Final diagnoses:  Herpes genitalis in women    Patient with clinical findings suggestive of herpes.  Onset 2 days ago.  Will treat with antiviral.  Recommend PCP follow-up. Prior gonorrhea, chlamydia, and HIV tests reviewed with the patient.   Roxy Horsemanobert Renan Danese, PA-C 07/20/15 [id number]  Arby BarretteMarcy Pfeiffer, MD 07/20/15 [phone]

## 2015-07-20 NOTE — ED Notes (Signed)
Pt c/o "I think I have herpes" states now has bumps on genitals-- had a pelvic 2 days ago, waiting for results.

## 2015-07-20 NOTE — Discharge Instructions (Signed)

## 2015-07-20 NOTE — ED Notes (Signed)
Pt ambulatory w/ steady gait to restroom. 

## 2015-07-20 NOTE — ED Notes (Signed)
Pt verbalized understanding of d/c instructions, prescriptions, and follow-up care. No further questions/concerns, VSS, ambulatory w/ steady gait (refused wheelchair) 

## 2015-07-21 LAB — POC URINE PREG, ED: PREG TEST UR: NEGATIVE

## 2015-08-15 DIAGNOSIS — Z8619 Personal history of other infectious and parasitic diseases: Secondary | ICD-10-CM

## 2015-08-15 HISTORY — DX: Personal history of other infectious and parasitic diseases: Z86.19

## 2015-08-17 ENCOUNTER — Encounter (HOSPITAL_COMMUNITY): Payer: Self-pay | Admitting: *Deleted

## 2015-08-17 ENCOUNTER — Emergency Department (HOSPITAL_COMMUNITY)
Admission: EM | Admit: 2015-08-17 | Discharge: 2015-08-17 | Disposition: A | Discharge status: Home / Self Care | Payer: Medicaid Other | Attending: Emergency Medicine | Admitting: Emergency Medicine

## 2015-08-17 DIAGNOSIS — Z8742 Personal history of other diseases of the female genital tract: Secondary | ICD-10-CM | POA: Insufficient documentation

## 2015-08-17 DIAGNOSIS — Z79899 Other long term (current) drug therapy: Secondary | ICD-10-CM | POA: Insufficient documentation

## 2015-08-17 DIAGNOSIS — Z3202 Encounter for pregnancy test, result negative: Secondary | ICD-10-CM | POA: Insufficient documentation

## 2015-08-17 DIAGNOSIS — R1010 Upper abdominal pain, unspecified: Secondary | ICD-10-CM | POA: Insufficient documentation

## 2015-08-17 DIAGNOSIS — D649 Anemia, unspecified: Secondary | ICD-10-CM | POA: Insufficient documentation

## 2015-08-17 DIAGNOSIS — Z8619 Personal history of other infectious and parasitic diseases: Secondary | ICD-10-CM | POA: Insufficient documentation

## 2015-08-17 HISTORY — DX: Herpesviral infection of urogenital system, unspecified: A60.00

## 2015-08-17 LAB — URINALYSIS, ROUTINE W REFLEX MICROSCOPIC
Bilirubin Urine: NEGATIVE
Glucose, UA: NEGATIVE mg/dL
Hgb urine dipstick: NEGATIVE
KETONES UR: NEGATIVE mg/dL
NITRITE: NEGATIVE
PH: 5.5 (ref 5.0–8.0)
Protein, ur: NEGATIVE mg/dL
SPECIFIC GRAVITY, URINE: 1.018 (ref 1.005–1.030)

## 2015-08-17 LAB — CBC WITH DIFFERENTIAL/PLATELET
Basophils Absolute: 0 10*3/uL (ref 0.0–0.1)
Basophils Relative: 1 %
EOS PCT: 2 %
Eosinophils Absolute: 0.1 10*3/uL (ref 0.0–0.7)
HCT: 33.3 % — ABNORMAL LOW (ref 36.0–46.0)
Hemoglobin: 11.3 g/dL — ABNORMAL LOW (ref 12.0–15.0)
LYMPHS ABS: 1.5 10*3/uL (ref 0.7–4.0)
LYMPHS PCT: 38 %
MCH: 31 pg (ref 26.0–34.0)
MCHC: 33.9 g/dL (ref 30.0–36.0)
MCV: 91.2 fL (ref 78.0–100.0)
MONO ABS: 0.2 10*3/uL (ref 0.1–1.0)
Monocytes Relative: 6 %
NEUTROS ABS: 2.2 10*3/uL (ref 1.7–7.7)
NEUTROS PCT: 53 %
PLATELETS: 143 10*3/uL — AB (ref 150–400)
RBC: 3.65 MIL/uL — AB (ref 3.87–5.11)
RDW: 14 % (ref 11.5–15.5)
WBC: 4 10*3/uL (ref 4.0–10.5)

## 2015-08-17 LAB — COMPREHENSIVE METABOLIC PANEL
ALBUMIN: 3.6 g/dL (ref 3.5–5.0)
ALK PHOS: 45 U/L (ref 38–126)
ALT: 12 U/L — ABNORMAL LOW (ref 14–54)
ANION GAP: 8 (ref 5–15)
AST: 17 U/L (ref 15–41)
BUN: 9 mg/dL (ref 6–20)
CALCIUM: 9.1 mg/dL (ref 8.9–10.3)
CHLORIDE: 106 mmol/L (ref 101–111)
CO2: 25 mmol/L (ref 22–32)
Creatinine, Ser: 0.75 mg/dL (ref 0.44–1.00)
GFR calc Af Amer: >60 mL/min (ref >60)
GFR calc non Af Amer: >60 mL/min (ref >60)
GLUCOSE: 106 mg/dL — AB (ref 65–99)
Potassium: 3.8 mmol/L (ref 3.5–5.1)
SODIUM: 139 mmol/L (ref 135–145)
Total Bilirubin: 0.6 mg/dL (ref 0.3–1.2)
Total Protein: 6.1 g/dL — ABNORMAL LOW (ref 6.5–8.1)

## 2015-08-17 LAB — LIPASE, BLOOD: Lipase: 26 U/L (ref 11–51)

## 2015-08-17 LAB — URINE MICROSCOPIC-ADD ON: RBC / HPF: NONE SEEN RBC/hpf (ref 0–5)

## 2015-08-17 LAB — POC URINE PREG, ED: PREG TEST UR: NEGATIVE

## 2015-08-17 MED ORDER — ACETAMINOPHEN 325 MG PO TABS
650.0000 mg | ORAL_TABLET | Freq: Once | ORAL | Status: AC
  Administered 2015-08-17: 650 mg via ORAL
  Filled 2015-08-17: qty 2

## 2015-08-17 MED ORDER — OMEPRAZOLE 20 MG PO CPDR: 20.0000 mg | DELAYED_RELEASE_CAPSULE | Freq: Two times a day (BID) | ORAL | Status: DC

## 2015-08-17 NOTE — ED Notes (Signed)
Urine obtained by clean catch. Urine was cloudy

## 2015-08-17 NOTE — ED Notes (Signed)
Lab contacted in regards to status of pts blood work per Dr. Alric RanGoldston's request, Selena BattenKim in lab advised they just received blood work at [phone].

## 2015-08-17 NOTE — ED Provider Notes (Addendum)
CSN: [id number]     Arrival date & time 08/17/15  4098 History   First MD Initiated Contact with Patient 08/17/15 0654     Chief Complaint  Patient presents with  . Abdominal Pain     (Consider location/radiation/quality/duration/timing/severity/associated sxs/prior Treatment) HPI  20 year old female presents with about 1 hour of upper abdominal pain. Woke her up from sleep. Pain is mostly just superior to umbilicus. No nausea, vomiting, diarrhea or constipation. Last BM was yesterday and normal. Feels like someone is kicking her/cramping sensation. Took ibuprofen and pain went away and now is back but not as severe. No lower abdominal pain. No vaginal discharge, bleeding. No dysuria. No back pain.   Past Medical History  Diagnosis Date  . BV (bacterial vaginosis) 11/12/ 2010  . Yeast infection   . Genital herpes    History reviewed. No pertinent past surgical history. Family History  Problem Relation Age of Onset  . Asthma Mother    Social History  Substance Use Topics  . Smoking status: Never Smoker   . Smokeless tobacco: Never Used  . Alcohol Use: 3.0 oz/week    5 Shots of liquor per week     Comment: ocassionally   OB History    No data available     Review of Systems  Constitutional: Negative for fever.  Gastrointestinal: Positive for abdominal pain. Negative for nausea, vomiting, diarrhea and constipation.  Genitourinary: Negative for dysuria, vaginal bleeding and vaginal discharge.  Musculoskeletal: Negative for back pain.  All other systems reviewed and are negative.     Allergies  Review of patient's allergies indicates no known allergies.  Home Medications   Prior to Admission medications   Medication Sig Start Date End Date Taking? Authorizing Provider  acyclovir (ZOVIRAX) 400 MG tablet Take 1 tablet (400 mg total) by mouth 4 (four) times daily. 07/20/15   Roxy Horseman, PA-C  HYDROcodone-acetaminophen (NORCO/VICODIN) 5-325 MG per tablet Take 1 tablet  by mouth every 4 (four) hours as needed for moderate pain. Patient not taking: Reported on 04/15/2015 07/03/14   Azalia Bilis, MD  ipratropium (ATROVENT) 0.06 % nasal spray Place 2 sprays into both nostrils 4 (four) times daily. Patient not taking: Reported on 04/15/2015 06/16/14   Reuben Likes, MD  medroxyPROGESTERone (DEPO-PROVERA) 150 MG/ML injection Inject 1 mL (150 mg total) into the muscle every 3 (three) months. Patient not taking: Reported on 07/20/2015 03/04/12   Henreitta Leber, PA-C  traMADol (ULTRAM) 50 MG tablet Take 1 tablet (50 mg total) by mouth every 6 (six) hours as needed. 07/20/15   Roxy Horseman, PA-C   BP 99/67 mmHg  Pulse 75  Ht 5\' 6"  (1.676 m)  Wt 135 lb (61.236 kg)  BMI 21.80 kg/m2  SpO2 98% Physical Exam  Constitutional: She is oriented to person, place, and time. She appears well-developed and well-nourished.  HENT:  Head: Normocephalic and atraumatic.  Right Ear: External ear normal.  Left Ear: External ear normal.  Nose: Nose normal.  Eyes: Right eye exhibits no discharge. Left eye exhibits no discharge.  Cardiovascular: Normal rate, regular rhythm and normal heart sounds.   Pulmonary/Chest: Effort normal and breath sounds normal.  Abdominal: Soft. There is tenderness in the left upper quadrant.  Neurological: She is alert and oriented to person, place, and time.  Skin: Skin is warm and dry.  Nursing note and vitals reviewed.   ED Course  Procedures (including critical care time) Labs Review Labs Reviewed  URINALYSIS, ROUTINE W REFLEX MICROSCOPIC (  [facility]) - Abnormal; Notable for the following:    APPearance TURBID (*)    Leukocytes, UA LARGE (*)    All other components within normal limits  COMPREHENSIVE METABOLIC PANEL - Abnormal; Notable for the following:    Glucose, Bld 106 (*)    Total Protein 6.1 (*)    ALT 12 (*)    All other components within normal limits  CBC WITH DIFFERENTIAL/PLATELET - Abnormal; Notable for the following:    RBC 3.65  (*)    Hemoglobin 11.3 (*)    HCT 33.3 (*)    Platelets 143 (*)    All other components within normal limits  URINE MICROSCOPIC-ADD ON - Abnormal; Notable for the following:    Squamous Epithelial / LPF TOO NUMEROUS TO COUNT (*)    Bacteria, UA MANY (*)    All other components within normal limits  LIPASE, BLOOD  POC URINE PREG, ED    Imaging Review No results found. I have personally reviewed and evaluated these images and lab results as part of my medical decision-making.   EKG Interpretation None      MDM   Final diagnoses:  Upper abdominal pain  Anemia, unspecified anemia type    [age] old female with mild left upper quadrant pain. Patient feels much better after oral Tylenol. She denies any blood in her stools or black stools. She is noted to be anemic with a hemoglobin of 11.3. I discussed possible rectal exam to rule out occult blood in her stool for possible upper GI bleed. She refuses this. I discussed that her anemia could be from a GI bleed, although it could also be due to menstruation. No baseline hemoglobin to compare to. She is not tachycardic and appears in no distress. She has had some blood pressures in the 90s but her most recent is normal with a systolic over 100. I highly doubt she has active hemorrhage. She feels much better and she wants to go home. I discussed using a PPI and will follow-up with GI and a PCP. I discussed strict return precautions.    Pricilla LovelessScott Rosslyn Pasion, MD 08/17/15 [id number]  Pricilla LovelessScott Drury Ardizzone, MD 08/17/15 [phone]

## 2015-08-17 NOTE — ED Notes (Signed)
Patient presents with c/o lower mid abd pain.  Denies urinary symptoms or vaginal discharge, no N/V at this time

## 2015-08-17 NOTE — ED Notes (Signed)
Upon entering the room, patient was not on stretcher, no belongings of patients in the room, all monitoring equipment left on stretcher.

## 2015-08-17 NOTE — ED Notes (Signed)
MD at bedside. 

## 2015-11-09 ENCOUNTER — Encounter (HOSPITAL_COMMUNITY): Payer: Self-pay | Admitting: Emergency Medicine

## 2015-11-09 ENCOUNTER — Emergency Department (HOSPITAL_COMMUNITY)
Admission: EM | Admit: 2015-11-09 | Discharge: 2015-11-10 | Disposition: A | Discharge status: Other Acute Inpatient Hospital | Payer: Medicaid Other

## 2015-11-09 DIAGNOSIS — R45851 Suicidal ideations: Secondary | ICD-10-CM | POA: Insufficient documentation

## 2015-11-09 DIAGNOSIS — Y9289 Other specified places as the place of occurrence of the external cause: Secondary | ICD-10-CM | POA: Insufficient documentation

## 2015-11-09 DIAGNOSIS — S0990XA Unspecified injury of head, initial encounter: Secondary | ICD-10-CM | POA: Insufficient documentation

## 2015-11-09 DIAGNOSIS — F1310 Sedative, hypnotic or anxiolytic abuse, uncomplicated: Secondary | ICD-10-CM | POA: Insufficient documentation

## 2015-11-09 DIAGNOSIS — F1210 Cannabis abuse, uncomplicated: Secondary | ICD-10-CM | POA: Insufficient documentation

## 2015-11-09 DIAGNOSIS — Z8742 Personal history of other diseases of the female genital tract: Secondary | ICD-10-CM | POA: Insufficient documentation

## 2015-11-09 DIAGNOSIS — Z3202 Encounter for pregnancy test, result negative: Secondary | ICD-10-CM | POA: Insufficient documentation

## 2015-11-09 DIAGNOSIS — Y9389 Activity, other specified: Secondary | ICD-10-CM | POA: Insufficient documentation

## 2015-11-09 DIAGNOSIS — Y998 Other external cause status: Secondary | ICD-10-CM | POA: Insufficient documentation

## 2015-11-09 DIAGNOSIS — Z8619 Personal history of other infectious and parasitic diseases: Secondary | ICD-10-CM | POA: Insufficient documentation

## 2015-11-09 LAB — RAPID URINE DRUG SCREEN, HOSP PERFORMED
AMPHETAMINES: NOT DETECTED
BENZODIAZEPINES: POSITIVE — AB
Barbiturates: NOT DETECTED
Cocaine: NOT DETECTED
OPIATES: NOT DETECTED
Tetrahydrocannabinol: POSITIVE — AB

## 2015-11-09 LAB — CBC
HCT: 35.4 % — ABNORMAL LOW (ref 36.0–46.0)
HEMOGLOBIN: 12.3 g/dL (ref 12.0–15.0)
MCH: 31.1 pg (ref 26.0–34.0)
MCHC: 34.7 g/dL (ref 30.0–36.0)
MCV: 89.4 fL (ref 78.0–100.0)
PLATELETS: 185 10*3/uL (ref 150–400)
RBC: 3.96 MIL/uL (ref 3.87–5.11)
RDW: 13.4 % (ref 11.5–15.5)
WBC: 4.6 10*3/uL (ref 4.0–10.5)

## 2015-11-09 LAB — COMPREHENSIVE METABOLIC PANEL
ALT: 19 U/L (ref 14–54)
AST: 25 U/L (ref 15–41)
Albumin: 4.6 g/dL (ref 3.5–5.0)
Alkaline Phosphatase: 52 U/L (ref 38–126)
Anion gap: 10 (ref 5–15)
BUN: 10 mg/dL (ref 6–20)
CHLORIDE: 108 mmol/L (ref 101–111)
CO2: 23 mmol/L (ref 22–32)
Calcium: 9.6 mg/dL (ref 8.9–10.3)
Creatinine, Ser: 0.73 mg/dL (ref 0.44–1.00)
Glucose, Bld: 94 mg/dL (ref 65–99)
POTASSIUM: 4 mmol/L (ref 3.5–5.1)
SODIUM: 141 mmol/L (ref 135–145)
Total Bilirubin: 0.7 mg/dL (ref 0.3–1.2)
Total Protein: 7.8 g/dL (ref 6.5–8.1)

## 2015-11-09 LAB — SALICYLATE LEVEL

## 2015-11-09 LAB — ACETAMINOPHEN LEVEL: Acetaminophen (Tylenol), Serum: <10 ug/mL — ABNORMAL LOW (ref 10–30)

## 2015-11-09 LAB — ETHANOL

## 2015-11-09 LAB — I-STAT BETA HCG BLOOD, ED (MC, WL, AP ONLY): I-stat hCG, quantitative: <5 m[IU]/mL (ref <5)

## 2015-11-09 MED ORDER — LORAZEPAM 2 MG/ML IJ SOLN
INTRAMUSCULAR | Status: AC
  Filled 2015-11-09: qty 1

## 2015-11-09 MED ORDER — PANTOPRAZOLE SODIUM 40 MG PO TBEC
40.0000 mg | DELAYED_RELEASE_TABLET | Freq: Every day | ORAL | Status: DC
  Administered 2015-11-09: 40 mg via ORAL
  Filled 2015-11-09 (×2): qty 1

## 2015-11-09 MED ORDER — TRAZODONE HCL 50 MG PO TABS
50.0000 mg | ORAL_TABLET | Freq: Every evening | ORAL | Status: DC | PRN
  Administered 2015-11-09: 50 mg via ORAL
  Filled 2015-11-09: qty 1

## 2015-11-09 MED ORDER — HYDROXYZINE HCL 25 MG PO TABS
25.0000 mg | ORAL_TABLET | Freq: Once | ORAL | Status: AC
  Administered 2015-11-09: 25 mg via ORAL
  Filled 2015-11-09: qty 1

## 2015-11-09 MED ORDER — LORAZEPAM 2 MG/ML IJ SOLN
1.0000 mg | Freq: Once | INTRAMUSCULAR | Status: AC
  Administered 2015-11-09: 1 mg via INTRAMUSCULAR
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
## 2015-11-09 NOTE — ED Notes (Signed)
Sitter at bedside. Patient is crying and saying she wants to go home.

## 2015-11-09 NOTE — ED Notes (Signed)
Pt crying in triage, states she just wants to go home. Says she's so tired of crying. States she has to get out of here. That she does want to kill herself. Denies HI and pain

## 2015-11-09 NOTE — ED Notes (Signed)
Patient is screaming at someone on the phone and walking up and down the hallway pacing. Patient is tearful and denies any SI at this time. The patient is currently talking with MD

## 2015-11-09 NOTE — ED Notes (Signed)
Pt AAO x 3, crying, tearful, adamant that she doesn't want to be here and needs to go home, because she has to work in the morning. RN explained that pt is IVC and will need to speak with the Psychiatrist in the am.
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
## 2015-11-09 NOTE — ED Notes (Signed)
Pt oriented to room and unit.. She is very tearful and agitated. Pt denies S/I and H/I and and states "I am not staying here"  I tried to reassure patient of her safety.  15 minute checks and video monitoring in place.

## 2015-11-09 NOTE — ED Notes (Signed)
Pt spoke with Social Worker GrenadaBrittany in reference to being IVC and speaking with Psychiatrist in am.  Pt crying, screaming sitting on the floor adamantly stating she wants to go home.

## 2015-11-09 NOTE — ED Notes (Signed)
Patient continues to be upset that she can not leave. [employer] at the bedside to assist with the description of IVC and the rules for the department.
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
## 2015-11-09 NOTE — ED Notes (Signed)
Bed: [facility] Expected date:  Expected time:  Means of arrival:  Comments: 

## 2015-11-10 ENCOUNTER — Inpatient Hospital Stay (HOSPITAL_COMMUNITY)
Admission: AD | Admit: 2015-11-10 | Discharge: 2015-11-11 | DRG: 882 | Disposition: A | Discharge status: Home / Self Care | Payer: Federal, State, Local not specified - Other | Attending: Psychiatry | Admitting: Psychiatry

## 2015-11-10 ENCOUNTER — Encounter (HOSPITAL_COMMUNITY): Payer: Self-pay

## 2015-11-10 DIAGNOSIS — G4700 Insomnia, unspecified: Secondary | ICD-10-CM | POA: Diagnosis present

## 2015-11-10 DIAGNOSIS — F410 Panic disorder [episodic paroxysmal anxiety] without agoraphobia: Secondary | ICD-10-CM | POA: Diagnosis present

## 2015-11-10 DIAGNOSIS — Z825 Family history of asthma and other chronic lower respiratory diseases: Secondary | ICD-10-CM

## 2015-11-10 DIAGNOSIS — R45851 Suicidal ideations: Secondary | ICD-10-CM | POA: Diagnosis present

## 2015-11-10 DIAGNOSIS — F1910 Other psychoactive substance abuse, uncomplicated: Secondary | ICD-10-CM | POA: Diagnosis present

## 2015-11-10 DIAGNOSIS — F32A Depression, unspecified: Secondary | ICD-10-CM | POA: Diagnosis present

## 2015-11-10 DIAGNOSIS — F329 Major depressive disorder, single episode, unspecified: Secondary | ICD-10-CM | POA: Diagnosis present

## 2015-11-10 DIAGNOSIS — F4323 Adjustment disorder with mixed anxiety and depressed mood: Principal | ICD-10-CM | POA: Diagnosis present

## 2015-11-10 MED ORDER — HYDROXYZINE HCL 25 MG PO TABS
25.0000 mg | ORAL_TABLET | Freq: Three times a day (TID) | ORAL | Status: DC | PRN
  Filled 2015-11-10: qty 1

## 2015-11-10 MED ORDER — ALUM & MAG HYDROXIDE-SIMETH 200-200-20 MG/5ML PO SUSP: 30.0000 mL | ORAL | Status: DC | PRN

## 2015-11-10 MED ORDER — TRAZODONE HCL 100 MG PO TABS
100.0000 mg | ORAL_TABLET | Freq: Every evening | ORAL | Status: DC | PRN
  Administered 2015-11-10: 100 mg via ORAL
  Filled 2015-11-10: qty 1
  Filled 2015-11-10: qty 7

## 2015-11-10 MED ORDER — ACETAMINOPHEN 325 MG PO TABS: 650.0000 mg | ORAL_TABLET | Freq: Four times a day (QID) | ORAL | Status: DC | PRN

## 2015-11-10 MED ORDER — VALACYCLOVIR HCL 500 MG PO TABS
500.0000 mg | ORAL_TABLET | Freq: Two times a day (BID) | ORAL | Status: DC
  Administered 2015-11-10 – 2015-11-11 (×2): 500 mg via ORAL
  Filled 2015-11-10 (×4): qty 1

## 2015-11-10 MED ORDER — HYDROXYZINE HCL 25 MG PO TABS: 25.0000 mg | ORAL_TABLET | Freq: Three times a day (TID) | ORAL | Status: DC | PRN

## 2015-11-10 MED ORDER — LORAZEPAM 1 MG PO TABS
1.0000 mg | ORAL_TABLET | Freq: Three times a day (TID) | ORAL | Status: DC | PRN
Start: 2015-11-10 — End: 2015-11-10

## 2015-11-10 MED ORDER — MAGNESIUM HYDROXIDE 400 MG/5ML PO SUSP: 30.0000 mL | Freq: Every day | ORAL | Status: DC | PRN

## 2015-11-10 MED ORDER — PANTOPRAZOLE SODIUM 40 MG PO TBEC
40.0000 mg | DELAYED_RELEASE_TABLET | Freq: Every day | ORAL | Status: DC
Start: 2015-11-11 — End: 2015-11-11
  Filled 2015-11-10 (×2): qty 1

## 2015-11-10 NOTE — ED Notes (Signed)
Pt crying, loud, requesting to go home, tearful. This nurse and Julieanne Cottonina AC at bedside. Will continue to monitor.
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
## 2015-11-10 NOTE — ED Notes (Signed)
Charted in error.

## 2015-11-10 NOTE — Progress Notes (Signed)
Pt listed with family planning medicaid not medicaid of [place]

## 2015-11-10 NOTE — ED Notes (Signed)
Pt IVC. [facility] at facility [facility]. Pt ambulatory out of facility.

## 2015-11-10 NOTE — BH Assessment (Signed)
[facility] Assessment Progress Note  Per Thedore MinsMojeed Akintayo, MD, this pt requires psychiatric hospitalization at this time. Berneice Heinrichina Tate, RN, [facility]; she is to be transferred at 18:00. Pt is under IVC; [facility]. Pt's nurse, Morrie Sheldonshley, has been notified, and agrees to call report to [phone].  Pt is to be transported via Patent examinerlaw enforcement.  Doylene Canninghomas Akane Tessier, MA Triage Specialist [phone]
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
## 2015-11-10 NOTE — ED Notes (Signed)
This nurse [facility] to [facility], nurse busy, reports will call back for report.

## 2015-11-11 ENCOUNTER — Encounter (HOSPITAL_COMMUNITY): Payer: Self-pay | Admitting: Psychiatry

## 2015-11-11 DIAGNOSIS — F4323 Adjustment disorder with mixed anxiety and depressed mood: Secondary | ICD-10-CM | POA: Diagnosis present

## 2015-11-11 DIAGNOSIS — F1910 Other psychoactive substance abuse, uncomplicated: Secondary | ICD-10-CM | POA: Diagnosis present

## 2015-11-11 MED ORDER — VALACYCLOVIR HCL 500 MG PO TABS: 500.0000 mg | ORAL_TABLET | Freq: Two times a day (BID) | ORAL | Status: DC

## 2015-11-11 MED ORDER — TRAZODONE HCL 100 MG PO TABS: 100.0000 mg | ORAL_TABLET | Freq: Every evening | ORAL | Status: DC | PRN

## 2015-11-11 MED ORDER — OMEPRAZOLE 20 MG PO CPDR: 20.0000 mg | DELAYED_RELEASE_CAPSULE | Freq: Two times a day (BID) | ORAL | Status: DC

## 2015-11-11 MED ORDER — HYDROXYZINE HCL 25 MG PO TABS: 25.0000 mg | ORAL_TABLET | Freq: Three times a day (TID) | ORAL | Status: DC | PRN

## 2015-11-11 NOTE — B[facility] Suicide Risk Assessment (Signed)
[facility]   Principal Problem: <principal problem not specified> Discharge Diagnoses:  Patient Active Problem List   Diagnosis Date Noted  . Depression [F32.9] 11/10/2015  . Vaginal discharge [N89.8] 11/12/2012  . ECZEMA [L25.9] 05/27/2008  . ACNE [L70.8] 05/27/2008    Total Time spent with patient: 45 minutes  Musculoskeletal: Strength & Muscle Tone: within normal limits Gait & Station: normal Patient leans: normal  Psychiatric Specialty Exam: ROS  Blood pressure 109/86, pulse 99, temperature 98.2 F (36.8 C), temperature source Oral, resp. rate 16, height 5\' 8"  (1.727 m), weight 60.328 kg (133 lb), SpO2 98 %.Body mass index is 20.23 kg/(m^2).  General Appearance: Fairly Groomed  Patent attorneyye Contact::  Fair  Speech:  Clear and Coherent409  Volume:  Normal  Mood:  Euthymic and feels rested  Affect:  Appropriate  Thought Process:  Coherent and Goal Directed  Orientation:  Full (Time, Place, and Person)  Thought Content:  plans as she is D/C  Suicidal Thoughts:  No  Homicidal Thoughts:  No  Memory:  Immediate;   Fair Recent;   Fair Remote;   Fair  Judgement:  Fair  Insight:  Present  Psychomotor Activity:  Normal  Concentration:  Fair  Recall:  FiservFair  Fund of Knowledge:Fair  Language: Fair  Akathisia:  No  Handed:  Right  AIMS (if indicated):     Assets:  Desire for Improvement Housing Social Support Vocational/Educational  Sleep:     Cognition: WNL  ADL's:  Intact  In full contact with reality. There are no active SI plans or intent. She is willing and motivated to pursue outpatient treatment Mental Status Per Nursing Assessment::   On Admission:  NA  Demographic Factors:  Adolescent or young adult  Loss Factors: Legal issues  Historical Factors: none identified  Risk Reduction Factors:   Religious beliefs about death, Employed, Living with another person, especially a relative and Positive social support  Continued Clinical  Symptoms:  Depression:   Comorbid alcohol abuse/dependence Alcohol/Substance Abuse/Dependencies  Cognitive Features That Contribute To Risk:  None    Suicide Risk:  Minimal: No identifiable suicidal ideation.  Patients presenting with no risk factors but with morbid ruminations; may be classified as minimal risk based on the severity of the depressive symptoms  Follow-up Information    Follow up with [facility].   Why:  Walk in between 8am-9am Monday through Friday for hospital follow-up/medication management/assessment for counseling services. [facility] within 2 days of discharge if possible.    Contact information:   [address] [place], [place] [address] Phone: [phone] Fax: [phone]      Plan Of Care/Follow-up recommendations:  Activity:  as tolerated Diet:  regular  Whitnie Deleon A, MD 11/11/2015, 11:23 AM
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
## 2015-11-11 NOTE — Progress Notes (Signed)
D: Patient engaged in conversation with this Clinical research associatewriter and was focused on going home. Patient encouraged to talk with staff and utilized coping techniques learned while on unit.  A: Support and encouragement offered. Q 15 minute checks in progress and maintained for safety. R: Patient remains safe on unit.

## 2015-11-11 NOTE — Progress Notes (Signed)
  [facility] Discharge [facility] :  Will you be returning to the same living situation after discharge:  Yes,  home At discharge, do you have transportation home?: Yes,  grandmother will pick her up after lunch today Do you have the ability to pay for your medications: Yes,  mental health  Release of information consent forms completed and submitted to medical records by CSW.   Patient to Follow up at: Follow-up Information    Follow up with [facility].   Why:  Walk in between 8am-9am Monday through Friday for hospital follow-up/medication management/assessment for counseling services. [facility] within 2 days of discharge if possible.    Contact information:   [address] [place], [place] [address] Phone: [phone] Fax: [phone]      Next level of care provider has access to [facility] Link:no  Safety Planning and Suicide Prevention discussed: Yes,  SPE completed with pt's mother. Pt provided with mobile crisis/SPI pamphlet and encouraged to share this information with her support network.   Have you used any form of tobacco in the last 30 days? (Cigarettes, Smokeless Tobacco, Cigars, and/or Pipes): No  Has patient been referred to the Quitline?: N/A patient is not a smoker  Patient has been referred for addiction treatment: Yes-see above.   Smart, Kyre Jeffries LCSW 11/11/2015, 10:27 AM
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
## 2015-11-11 NOTE — Progress Notes (Signed)
D- Patient was tearful and tremulous stating "I want to go home".  Patient stated I am here because I needed sleeping pills.  I was stressed out and I felt like I just wanted my body to stop going patient stated that prior to admission she had SI with no intent and now she has no SI.  Patient denies SI, HI, and AVH.    A- assess patient for safety, offer medications as prescribed, engage patient in 1:1 staff talks.   R-  Patient able to contract for safety.
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
## 2015-11-11 NOTE — [facility] Suicide Risk Assessment (Signed)
[facility] INPATIENT:  Family/Significant Other Suicide Prevention Education  Suicide Prevention Education:  Education Completed; Stacey Rodriguez (pt's mother) [phone] has been identified by the patient as the family member/significant other with whom the patient will be residing, and identified as the person(s) who will aid the patient in the event of a mental health crisis (suicidal ideations/suicide attempt).  With written consent from the patient, the family member/significant other has been provided the following suicide prevention education, prior to the and/or following the discharge of the patient.  The suicide prevention education provided includes the following:  Suicide risk factors  Suicide prevention and interventions  National Suicide Hotline telephone number  [facility] assessment telephone number  [facility] Emergency Assistance 911  [facility] and/or Residential Mobile Crisis Unit telephone number  Request made of family/significant other to:  Remove weapons (e.g., guns, rifles, knives), all items previously/currently identified as safety concern.    Remove drugs/medications (over-the-counter, prescriptions, illicit drugs), all items previously/currently identified as a safety concern.  The family member/significant other verbalizes understanding of the suicide prevention education information provided.  The family member/significant other agrees to remove the items of safety concern listed above.  Pt's mother expressed no concerns regarding pt discharging today and returning to her home. Pt's mother provided with SPE and has no concerns/questions.   Smart, Stacey Shorkey LCSW 11/11/2015, 10:26 AM

## 2015-11-11 NOTE — Progress Notes (Signed)
Patient discharged to home self care in the company of her grandmother.  Upon discharge patient had a bright affect and stated that she desired to move back to her some town after all this court was over.  Patient  Reported feeling worried about her upcoming court date and expressed her desire to remain clean and sober.  Patient denies SI, HI and AVH and reported
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
## 2015-11-18 ENCOUNTER — Encounter (HOSPITAL_COMMUNITY): Payer: Self-pay | Admitting: Emergency Medicine

## 2015-11-18 ENCOUNTER — Emergency Department (HOSPITAL_COMMUNITY)
Admission: EM | Admit: 2015-11-18 | Discharge: 2015-11-18 | Disposition: A | Discharge status: Home / Self Care | Payer: Medicaid Other | Attending: Emergency Medicine | Admitting: Emergency Medicine

## 2015-11-18 DIAGNOSIS — Z8619 Personal history of other infectious and parasitic diseases: Secondary | ICD-10-CM | POA: Insufficient documentation

## 2015-11-18 DIAGNOSIS — N939 Abnormal uterine and vaginal bleeding, unspecified: Secondary | ICD-10-CM

## 2015-11-18 DIAGNOSIS — R300 Dysuria: Secondary | ICD-10-CM | POA: Insufficient documentation

## 2015-11-18 DIAGNOSIS — Z3202 Encounter for pregnancy test, result negative: Secondary | ICD-10-CM | POA: Insufficient documentation

## 2015-11-18 DIAGNOSIS — Z202 Contact with and (suspected) exposure to infections with a predominantly sexual mode of transmission: Secondary | ICD-10-CM | POA: Insufficient documentation

## 2015-11-18 DIAGNOSIS — Z79899 Other long term (current) drug therapy: Secondary | ICD-10-CM | POA: Insufficient documentation

## 2015-11-18 LAB — WET PREP, GENITAL
Sperm: NONE SEEN
YEAST WET PREP: NONE SEEN

## 2015-11-18 LAB — BASIC METABOLIC PANEL
Anion gap: 5 (ref 5–15)
BUN: 8 mg/dL (ref 6–20)
CALCIUM: 9.3 mg/dL (ref 8.9–10.3)
CHLORIDE: 108 mmol/L (ref 101–111)
CO2: 27 mmol/L (ref 22–32)
CREATININE: 0.72 mg/dL (ref 0.44–1.00)
GFR calc Af Amer: >60 mL/min (ref >60)
GFR calc non Af Amer: >60 mL/min (ref >60)
Glucose, Bld: 100 mg/dL — ABNORMAL HIGH (ref 65–99)
Potassium: 4.4 mmol/L (ref 3.5–5.1)
SODIUM: 140 mmol/L (ref 135–145)

## 2015-11-18 LAB — CBC WITH DIFFERENTIAL/PLATELET
BASOS PCT: 1 %
Basophils Absolute: 0 10*3/uL (ref 0.0–0.1)
EOS ABS: 0.2 10*3/uL (ref 0.0–0.7)
Eosinophils Relative: 5 %
HEMATOCRIT: 34.9 % — AB (ref 36.0–46.0)
HEMOGLOBIN: 11.7 g/dL — AB (ref 12.0–15.0)
LYMPHS ABS: 1.4 10*3/uL (ref 0.7–4.0)
Lymphocytes Relative: 46 %
MCH: 30.2 pg (ref 26.0–34.0)
MCHC: 33.5 g/dL (ref 30.0–36.0)
MCV: 90.2 fL (ref 78.0–100.0)
MONO ABS: 0.3 10*3/uL (ref 0.1–1.0)
MONOS PCT: 9 %
NEUTROS PCT: 39 %
Neutro Abs: 1.2 10*3/uL — ABNORMAL LOW (ref 1.7–7.7)
Platelets: 166 10*3/uL (ref 150–400)
RBC: 3.87 MIL/uL (ref 3.87–5.11)
RDW: 13.4 % (ref 11.5–15.5)
WBC: 3 10*3/uL — ABNORMAL LOW (ref 4.0–10.5)

## 2015-11-18 LAB — I-STAT BETA HCG BLOOD, ED (MC, WL, AP ONLY): I-stat hCG, quantitative: <5 m[IU]/mL (ref <5)

## 2015-11-18 MED ORDER — AZITHROMYCIN 250 MG PO TABS
1000.0000 mg | ORAL_TABLET | Freq: Once | ORAL | Status: AC
  Administered 2015-11-18: 1000 mg via ORAL
  Filled 2015-11-18: qty 4

## 2015-11-18 MED ORDER — METRONIDAZOLE 500 MG PO TABS
2000.0000 mg | ORAL_TABLET | Freq: Once | ORAL | Status: AC
  Administered 2015-11-18: 2000 mg via ORAL
  Filled 2015-11-18: qty 4

## 2015-11-18 MED ORDER — CEFTRIAXONE SODIUM 250 MG IJ SOLR
250.0000 mg | Freq: Once | INTRAMUSCULAR | Status: DC
  Filled 2015-11-18: qty 250

## 2015-11-18 MED ORDER — LIDOCAINE HCL (PF) 1 % IJ SOLN
INTRAMUSCULAR | Status: AC
  Filled 2015-11-18: qty 5

## 2015-11-18 NOTE — ED Provider Notes (Signed)
CSN: [id number]     Arrival date & time 11/18/15  0846 History   First MD Initiated Contact with Patient 11/18/15 0945     Chief Complaint  Patient presents with  . Vaginal Bleeding  . SEXUALLY TRANSMITTED DISEASE     (Consider location/radiation/quality/duration/timing/severity/associated sxs/prior Treatment) Patient is a 20 y.o. female presenting with vaginal bleeding. The history is provided by the patient.  Vaginal Bleeding Associated symptoms: dysuria   Associated symptoms: no abdominal pain, no back pain and no fever   Patient completed with a complaint of heavy vaginal bleeding for one week. With some vaginal burning. Also feels that there is a foul odor. No obvious vaginal discharge. Patient is on Depakote. But normally does not have heavy bleeding like this. No fevers no lightheadedness not feeling like she's got pass out. Denies a rash. Patient is concerned about STD exposure.  Past Medical History  Diagnosis Date  . BV (bacterial vaginosis) 11/12/ 2010  . Yeast infection   . Genital herpes    History reviewed. No pertinent past surgical history. Family History  Problem Relation Age of Onset  . Asthma Mother    Social History  Substance Use Topics  . Smoking status: Never Smoker   . Smokeless tobacco: Never Used  . Alcohol Use: 3.0 oz/week    5 Shots of liquor per week     Comment: ocassionally   OB History    No data available     Review of Systems  Constitutional: Negative for fever.  HENT: Negative for congestion.   Eyes: Negative for redness and visual disturbance.  Respiratory: Negative for shortness of breath.   Cardiovascular: Negative for chest pain.  Gastrointestinal: Negative for abdominal pain.  Genitourinary: Positive for dysuria, vaginal bleeding and vaginal pain.  Musculoskeletal: Negative for back pain.  Skin: Negative for rash.  Neurological: Negative for headaches.  Hematological: Does not bruise/bleed easily.  Psychiatric/Behavioral:  Negative for confusion.      Allergies  Review of patient's allergies indicates no known allergies.  Home Medications   Prior to Admission medications   Medication Sig Start Date End Date Taking? Authorizing Provider  hydrOXYzine (ATARAX/VISTARIL) 25 MG tablet Take 1 tablet (25 mg total) by mouth 3 (three) times daily as needed for anxiety. 11/11/15   Sanjuana Kava, NP  omeprazole (PRILOSEC) 20 MG capsule Take 1 capsule (20 mg total) by mouth 2 (two) times daily before a meal. For acid reflux 11/11/15   Sanjuana Kava, NP  traZODone (DESYREL) 100 MG tablet Take 1 tablet (100 mg total) by mouth at bedtime as needed for sleep. 11/11/15   Sanjuana Kava, NP  valACYclovir (VALTREX) 500 MG tablet Take 1 tablet (500 mg total) by mouth 2 (two) times daily. For herpes infection 11/11/15   Sanjuana Kava, NP   BP 106/76 mmHg  Pulse 86  Temp(Src) 97.9 F (36.6 C) (Oral)  Resp 17  Ht  (1.727 m)  Wt 60.328 kg  BMI 20.23 kg/m2  SpO2 89% Physical Exam  Constitutional: She is oriented to person, place, and time. She appears well-developed and well-nourished. No distress.  HENT:  Head: Normocephalic and atraumatic.  Mouth/Throat: Oropharynx is clear and moist.  Eyes: Conjunctivae and EOM are normal. Pupils are equal, round, and reactive to light.  Neck: Normal range of motion. Neck supple.  Cardiovascular: Normal rate, regular rhythm and normal heart sounds.   No murmur heard. Pulmonary/Chest: Effort normal and breath sounds normal.  Abdominal: Soft. Bowel sounds  are normal. There is no tenderness.  Genitourinary: Uterus normal. No vaginal discharge found.  No cervical motion tenderness uterus nontender adnexa nontender. There is a normal-appearing vaginal bleeding no clots. External genitalia normal.  Musculoskeletal: Normal range of motion. She exhibits no edema.  Neurological: She is alert and oriented to person, place, and time. No cranial nerve deficit. She exhibits normal muscle tone.  Coordination normal.  Skin: Skin is warm. No rash noted.  Nursing note and vitals reviewed.   ED Course  Procedures (including critical care time) Labs Review Labs Reviewed  CBC WITH DIFFERENTIAL/PLATELET - Abnormal; Notable for the following:    WBC 3.0 (*)    Hemoglobin 11.7 (*)    HCT 34.9 (*)    Neutro Abs 1.2 (*)    All other components within normal limits  WET PREP, GENITAL  BASIC METABOLIC PANEL  HIV ANTIBODY (ROUTINE TESTING)  I-STAT BETA HCG BLOOD, ED ([facility], [facility], [facility] ONLY)  GC/CHLAMYDIA PROBE AMP ([place]) NOT AT [facility]   Results for orders placed or performed during the hospital encounter of 11/18/15  CBC with Differential/Platelet  Result Value Ref Range   WBC 3.0 (L) 4.0 - 10.5 K/uL   RBC 3.87 3.87 - 5.11 MIL/uL   Hemoglobin 11.7 (L) 12.0 - 15.0 g/dL   HCT 29.534.9 (L) 28.436.0 - 13.246.0 %   MCV 90.2 78.0 - 100.0 fL   MCH 30.2 26.0 - 34.0 pg   MCHC 33.5 30.0 - 36.0 g/dL   RDW 44.013.4 10.211.5 - 72.515.5 %   Platelets 166 150 - 400 K/uL   Neutrophils Relative % 39 %   Neutro Abs 1.2 (L) 1.7 - 7.7 K/uL   Lymphocytes Relative 46 %   Lymphs Abs 1.4 0.7 - 4.0 K/uL   Monocytes Relative 9 %   Monocytes Absolute 0.3 0.1 - 1.0 K/uL   Eosinophils Relative 5 %   Eosinophils Absolute 0.2 0.0 - 0.7 K/uL   Basophils Relative 1 %   Basophils Absolute 0.0 0.0 - 0.1 K/uL  I-Stat Beta hCG blood, ED ([facility], [facility], [facility] only)  Result Value Ref Range   I-stat hCG, quantitative <5.0 <5 mIU/mL   Comment 3              Imaging Review No results found. I have personally reviewed and evaluated these images and lab results as part of my medical decision-making.   EKG Interpretation None      MDM   Final diagnoses:  Vaginal bleeding, abnormal  STD exposure    Asians on Depo-Provera. Patient with heavy vaginal bleeding and discomfort this week. Patient also concerned about STD exposure. On pelvic exam no cervical motion tenderness no adnexal tenderness no uterine tenderness. No obvious discharge  other than a well-appearing vaginal bleeding. No clots. Patient treated for STD pretty see test was negative. Patient received Rocephin and Zithromax and Flagyl here. Patient's cultures and wet prep are pending she had to leave. Patient nontoxic no acute distress. Patient without any significant anemia.    Vanetta MuldersScott Renesmay Nesbitt, MD 11/18/15 1126

## 2015-11-18 NOTE — ED Notes (Signed)
Patient refusing Rocephin injection.  Patient states "I just can not handle that shot, its going to hurt too much".  Explained to patient by this RN and Dr. Deretha EmoryZackowski the need for treatment.  Advised to follow up with PCP.  Patient refused to wait due to having to be at her employer at 11:30.

## 2015-11-18 NOTE — Discharge Instructions (Signed)
Abnormal Uterine Bleeding Abnormal uterine bleeding means bleeding from the vagina that is not your normal menstrual period. This can be:  Bleeding or spotting between periods.  Bleeding after sex (sexual intercourse).  Bleeding that is heavier or more than normal.  Periods that last longer than usual.  Bleeding after menopause. There are many problems that may cause this. Treatment will depend on the cause of the bleeding. Any kind of bleeding that is not normal should be reviewed by your doctor.  HOME CARE Watch your condition for any changes. These actions may lessen any discomfort you are having:  Do not use tampons or douches as told by your doctor.  Change your pads often. You should get regular pelvic exams and Pap tests. Keep all appointments for tests as told by your doctor. GET HELP IF:  You are bleeding for more than 1 week.  You feel dizzy at times. GET HELP RIGHT AWAY IF:   You pass out.  You have to change pads every 15 to 30 minutes.  You have belly pain.  You have a fever.  You become sweaty or weak.  You are passing large blood clots from the vagina.  You feel sick to your stomach (nauseous) and throw up (vomit). MAKE SURE YOU:  Understand these instructions.  Will watch your condition.  Will get help right away if you are not doing well or get worse.   This information is not intended to replace advice given to you by your health care provider. Make sure you discuss any questions you have with your health care provider.   No you have been treated for STD exposure. Would recommend taking Motrin or Naprosyn for the vaginal discomfort. Return for any new or worse symptoms.   Document Released: 05/28/2009 Document Revised: 08/05/2013 Document Reviewed: 02/27/2013 Elsevier Interactive Patient Education [employer]2016 Elsevier Inc.

## 2015-11-18 NOTE — ED Notes (Signed)
Patient coming from home with c/o of vaginal bleeding x 1 week with burning.  Patient denies foul smells and other vaginal discharge.  Patient has the Depo shot, place last week.

## 2015-11-19 LAB — HIV ANTIBODY (ROUTINE TESTING W REFLEX): HIV Screen 4th Generation wRfx: NONREACTIVE

## 2015-11-19 LAB — GC/CHLAMYDIA PROBE AMP (~~LOC~~) NOT AT ARMC
Chlamydia: NEGATIVE
NEISSERIA GONORRHEA: NEGATIVE

## 2016-04-01 ENCOUNTER — Encounter (HOSPITAL_COMMUNITY): Payer: Self-pay

## 2016-04-01 ENCOUNTER — Inpatient Hospital Stay (HOSPITAL_COMMUNITY)
Admission: AD | Admit: 2016-04-01 | Discharge: 2016-04-01 | Disposition: A | Discharge status: Home / Self Care | Payer: Medicaid Other | Source: Ambulatory Visit | Attending: Obstetrics and Gynecology | Admitting: Obstetrics and Gynecology

## 2016-04-01 DIAGNOSIS — B9689 Other specified bacterial agents as the cause of diseases classified elsewhere: Secondary | ICD-10-CM

## 2016-04-01 DIAGNOSIS — N898 Other specified noninflammatory disorders of vagina: Secondary | ICD-10-CM | POA: Insufficient documentation

## 2016-04-01 DIAGNOSIS — N760 Acute vaginitis: Secondary | ICD-10-CM

## 2016-04-01 DIAGNOSIS — Z3202 Encounter for pregnancy test, result negative: Secondary | ICD-10-CM | POA: Insufficient documentation

## 2016-04-01 DIAGNOSIS — Z8619 Personal history of other infectious and parasitic diseases: Secondary | ICD-10-CM | POA: Insufficient documentation

## 2016-04-01 DIAGNOSIS — A499 Bacterial infection, unspecified: Secondary | ICD-10-CM

## 2016-04-01 LAB — URINALYSIS, ROUTINE W REFLEX MICROSCOPIC
Bilirubin Urine: NEGATIVE
Glucose, UA: NEGATIVE mg/dL
Ketones, ur: NEGATIVE mg/dL
LEUKOCYTES UA: NEGATIVE
Nitrite: NEGATIVE
PROTEIN: NEGATIVE mg/dL
SPECIFIC GRAVITY, URINE: 1.015 (ref 1.005–1.030)
pH: 6 (ref 5.0–8.0)

## 2016-04-01 LAB — WET PREP, GENITAL
Sperm: NONE SEEN
Trich, Wet Prep: NONE SEEN
Yeast Wet Prep HPF POC: NONE SEEN

## 2016-04-01 LAB — URINE MICROSCOPIC-ADD ON

## 2016-04-01 LAB — POCT PREGNANCY, URINE: PREG TEST UR: NEGATIVE

## 2016-04-01 MED ORDER — METRONIDAZOLE 500 MG PO TABS: 500.0000 mg | ORAL_TABLET | Freq: Two times a day (BID) | ORAL | 0 refills | Status: DC

## 2016-04-01 NOTE — MAU Provider Note (Signed)
History     CSN: [id number]  Arrival date and time: 04/01/16 [id number]   First Provider Initiated Contact with Patient 04/01/16 1015      Chief Complaint  Patient presents with  . Vaginal Discharge   HPI Stacey Rodriguez 20 y.o.  Comes to MAU for intermittent bleeding for 2 weeks.  [facility]  Next injection due at the end of this month.  Came to get checked for "everything".  Denies any pain, itching or burning.  Notes an odor that she has had also.  Has been using tampons and pads for the intermittent bleeding.  Previous chart reviewed and trichomonas seen on wet prep April 2016 - reports she was treated for trichomonas.  Also reviewed past ER note from 07-20-15.  At that time, she was diagnosed with herpes based on clinical appearance.  No confirmatory test for HSV was ever done.  OB History    Gravida Para Term Preterm AB Living   0 0 0 0 0 0   SAB TAB Ectopic Multiple Live Births   0 0 0 0 0      Past Medical History:  Diagnosis Date  . BV (bacterial vaginosis) 11/12/ 2010  . Genital herpes   . Yeast infection     History reviewed. No pertinent surgical history.  Family History  Problem Relation Age of Onset  . Asthma Mother     Social History  Substance Use Topics  . Smoking status: Never Smoker  . Smokeless tobacco: Never Used  . Alcohol use 3.0 oz/week    5 Shots of liquor per week     Comment: ocassionally    Allergies: No Known Allergies  Prescriptions Prior to Admission  Medication Sig Dispense Refill Last Dose  . hydrOXYzine (ATARAX/VISTARIL) 25 MG tablet Take 1 tablet (25 mg total) by mouth 3 (three) times daily as needed for anxiety. (Patient not taking: Reported on 04/01/2016) 45 tablet 0 Not Taking at Unknown time  . omeprazole (PRILOSEC) 20 MG capsule Take 1 capsule (20 mg total) by mouth 2 (two) times daily before a meal. For acid reflux (Patient not taking: Reported on 04/01/2016) 30 capsule 0 Not Taking at Unknown time  .  traZODone (DESYREL) 100 MG tablet Take 1 tablet (100 mg total) by mouth at bedtime as needed for sleep. (Patient not taking: Reported on 04/01/2016) 30 tablet 0 Not Taking  . valACYclovir (VALTREX) 500 MG tablet Take 1 tablet (500 mg total) by mouth 2 (two) times daily. For herpes infection (Patient not taking: Reported on 04/01/2016) 10 tablet 0 Not Taking at Unknown time    Review of Systems  Constitutional: Negative for fever.  Genitourinary:       No vaginal discharge. Vaginal bleeding. No dysuria. Vaginal odor.   Physical Exam   Blood pressure 102/66, pulse 75, temperature 99 F (37.2 C), temperature source Oral, resp. rate 16, height 5\' 7"  (1.702 m), weight 131 lb (59.4 kg), last menstrual period 03/24/2016.  Physical Exam  Nursing note and vitals reviewed. Constitutional: She is oriented to person, place, and time. She appears well-developed and well-nourished.  HENT:  Head: Normocephalic.  Eyes: EOM are normal.  Neck: Neck supple.  GI: Soft. There is no tenderness.  Genitourinary:  Genitourinary Comments: Speculum exam: Vulva - 2 pinpoint clear blister like areas noted - nontender and with palption areas do not break open. Vagina - Small amount of pink discharge, no odor Cervix - No contact bleeding GC/Chlam,  wet prep done Chaperone present for exam.  Musculoskeletal: Normal range of motion.  Neurological: She is alert and oriented to person, place, and time.  Skin: Skin is warm and dry.  Psychiatric: She has a normal mood and affect.    MAU Course  Procedures  MDM Based on clinical appearance - does not appear to be HSV outbreak today unless this worsens.  Results for orders placed or performed during the hospital encounter of 04/01/16 (from the past 24 hour(s))  Urinalysis, Routine w reflex microscopic ([facility])     Status: Abnormal   Collection Time: 04/01/16  9:30 AM  Result Value Ref Range   Color, Urine YELLOW YELLOW   APPearance HAZY (A) CLEAR    Specific Gravity, Urine 1.015 1.005 - 1.030   pH 6.0 5.0 - 8.0   Glucose, UA NEGATIVE NEGATIVE mg/dL   Hgb urine dipstick MODERATE (A) NEGATIVE   Bilirubin Urine NEGATIVE NEGATIVE   Ketones, ur NEGATIVE NEGATIVE mg/dL   Protein, ur NEGATIVE NEGATIVE mg/dL   Nitrite NEGATIVE NEGATIVE   Leukocytes, UA NEGATIVE NEGATIVE  Urine microscopic-add on     Status: Abnormal   Collection Time: 04/01/16  9:30 AM  Result Value Ref Range   Squamous Epithelial / LPF 0-5 (A) NONE SEEN   WBC, UA 0-5 0 - 5 WBC/hpf   RBC / HPF 0-5 0 - 5 RBC/hpf   Bacteria, UA MANY (A) NONE SEEN  Pregnancy, urine POC     Status: None   Collection Time: 04/01/16  9:40 AM  Result Value Ref Range   Preg Test, Ur NEGATIVE NEGATIVE  Wet prep, genital     Status: Abnormal   Collection Time: 04/01/16 10:20 AM  Result Value Ref Range   Yeast Wet Prep HPF POC NONE SEEN NONE SEEN   Trich, Wet Prep NONE SEEN NONE SEEN   Clue Cells Wet Prep HPF POC PRESENT (A) NONE SEEN   WBC, Wet Prep HPF POC FEW (A) NONE SEEN   Sperm NONE SEEN      Assessment and Plan  Bacterial vaginosis  Plan Will eprescribe metronidazole to treat BV Advised no alcohol while taking medication. Advised condoms for intercourse always and particularly while on medication. Doctor's note given to her today for this visit. [facility] and to get refill of Valtrex if desired.  Darbie Biancardi L Cintya Daughety 04/01/2016, 10:48 AM

## 2016-04-01 NOTE — MAU Note (Signed)
Vaginal odor for 1 week denies vaginal discharge, denies pain.

## 2016-04-01 NOTE — Discharge Instructions (Signed)
Get your medication at the pharmacy. Be sure [facility] this [facility]. No alchol while you are taking medication. Condoms always if you are having sex and especially while you are taking medication to allow your vaginal infection to get better.

## 2016-04-03 LAB — GC/CHLAMYDIA PROBE AMP (~~LOC~~) NOT AT ARMC
CHLAMYDIA, DNA PROBE: NEGATIVE
NEISSERIA GONORRHEA: NEGATIVE

## 2016-05-11 ENCOUNTER — Inpatient Hospital Stay (HOSPITAL_COMMUNITY)
Admission: AD | Admit: 2016-05-11 | Discharge: 2016-05-11 | Discharge status: Against Medical Advice | Payer: Medicaid Other | Source: Ambulatory Visit | Attending: Obstetrics & Gynecology | Admitting: Obstetrics & Gynecology

## 2016-05-11 DIAGNOSIS — R1030 Lower abdominal pain, unspecified: Secondary | ICD-10-CM | POA: Insufficient documentation

## 2016-05-11 LAB — URINE MICROSCOPIC-ADD ON

## 2016-05-11 LAB — URINALYSIS, ROUTINE W REFLEX MICROSCOPIC
BILIRUBIN URINE: NEGATIVE
GLUCOSE, UA: NEGATIVE mg/dL
Hgb urine dipstick: NEGATIVE
KETONES UR: NEGATIVE mg/dL
NITRITE: NEGATIVE
PH: 6 (ref 5.0–8.0)
PROTEIN: NEGATIVE mg/dL
Specific Gravity, Urine: 1.02 (ref 1.005–1.030)

## 2016-05-11 LAB — POCT PREGNANCY, URINE: Preg Test, Ur: NEGATIVE

## 2016-05-11 NOTE — MAU Note (Signed)
Pt's room found empty, pt left without informing staff.

## 2016-05-29 ENCOUNTER — Emergency Department (HOSPITAL_COMMUNITY)
Admission: EM | Admit: 2016-05-29 | Discharge: 2016-05-29 | Disposition: A | Discharge status: Home / Self Care | Payer: No Typology Code available for payment source | Attending: Emergency Medicine | Admitting: Emergency Medicine

## 2016-05-29 ENCOUNTER — Encounter (HOSPITAL_COMMUNITY): Payer: Self-pay | Admitting: Emergency Medicine

## 2016-05-29 DIAGNOSIS — Y939 Activity, unspecified: Secondary | ICD-10-CM | POA: Diagnosis not present

## 2016-05-29 DIAGNOSIS — Y92410 Unspecified street and highway as the place of occurrence of the external cause: Secondary | ICD-10-CM | POA: Diagnosis not present

## 2016-05-29 DIAGNOSIS — Y999 Unspecified external cause status: Secondary | ICD-10-CM | POA: Insufficient documentation

## 2016-05-29 DIAGNOSIS — M545 Low back pain, unspecified: Secondary | ICD-10-CM

## 2016-05-29 MED ORDER — CYCLOBENZAPRINE HCL 10 MG PO TABS: 10.0000 mg | ORAL_TABLET | Freq: Three times a day (TID) | ORAL | 0 refills | Status: DC | PRN

## 2016-05-29 MED ORDER — IBUPROFEN 800 MG PO TABS: 800.0000 mg | ORAL_TABLET | Freq: Three times a day (TID) | ORAL | 0 refills | Status: DC | PRN

## 2016-05-29 MED ORDER — LIDOCAINE 5 % EX PTCH: 1.0000 | MEDICATED_PATCH | CUTANEOUS | 0 refills | Status: DC

## 2016-05-29 NOTE — ED Notes (Signed)
Declined W/C at D/C and was escorted to lobby by RN. 
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
## 2016-05-29 NOTE — Discharge Instructions (Signed)
Read the information below.  Use the prescribed medication as directed.  Please discuss all new medications with your pharmacist.  You may return to the Emergency Department at any time for worsening condition or any new symptoms that concern you.    If you develop fevers, loss of control of bowel or bladder, weakness or numbness in your legs, or are unable to walk, return to the ER for a recheck.  °
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
## 2016-05-29 NOTE — Discharge Planning (Signed)
[facility].  No needs identified.    Follow-up appointment made?n/a  Does patient have transportation Issues? n/a  Medication needs? Pt has Medicaid [employer] and spatial designerinsurance  Equipment Needs? n/a                                      Oxygen Needs? n/a  PT equipment ordered? n/a  H.H needed? n/a                                                H.H ordered? n/a

## 2016-07-30 ENCOUNTER — Encounter (HOSPITAL_COMMUNITY): Payer: Self-pay | Admitting: *Deleted

## 2016-07-30 ENCOUNTER — Inpatient Hospital Stay (HOSPITAL_COMMUNITY)
Admission: AD | Admit: 2016-07-30 | Discharge: 2016-07-30 | Disposition: A | Discharge status: Home / Self Care | Payer: Medicaid Other | Source: Ambulatory Visit | Attending: Obstetrics & Gynecology | Admitting: Obstetrics & Gynecology

## 2016-07-30 DIAGNOSIS — B3731 Acute candidiasis of vulva and vagina: Secondary | ICD-10-CM

## 2016-07-30 DIAGNOSIS — B373 Candidiasis of vulva and vagina: Secondary | ICD-10-CM | POA: Insufficient documentation

## 2016-07-30 DIAGNOSIS — H6692 Otitis media, unspecified, left ear: Secondary | ICD-10-CM

## 2016-07-30 DIAGNOSIS — Z3202 Encounter for pregnancy test, result negative: Secondary | ICD-10-CM | POA: Insufficient documentation

## 2016-07-30 DIAGNOSIS — Z202 Contact with and (suspected) exposure to infections with a predominantly sexual mode of transmission: Secondary | ICD-10-CM | POA: Insufficient documentation

## 2016-07-30 DIAGNOSIS — B9689 Other specified bacterial agents as the cause of diseases classified elsewhere: Secondary | ICD-10-CM

## 2016-07-30 DIAGNOSIS — N760 Acute vaginitis: Secondary | ICD-10-CM

## 2016-07-30 LAB — WET PREP, GENITAL
SPERM: NONE SEEN
TRICH WET PREP: NONE SEEN

## 2016-07-30 LAB — POCT PREGNANCY, URINE: PREG TEST UR: NEGATIVE

## 2016-07-30 MED ORDER — METRONIDAZOLE 500 MG PO TABS: 500.0000 mg | ORAL_TABLET | Freq: Two times a day (BID) | ORAL | 0 refills | Status: DC

## 2016-07-30 MED ORDER — AMOXICILLIN 500 MG PO CAPS: 500.0000 mg | ORAL_CAPSULE | Freq: Three times a day (TID) | ORAL | 0 refills | Status: DC

## 2016-07-30 MED ORDER — FLUCONAZOLE 150 MG PO TABS: 150.0000 mg | ORAL_TABLET | Freq: Once | ORAL | 1 refills | Status: AC

## 2016-07-30 NOTE — MAU Provider Note (Signed)
History     CSN: [id number]  Arrival date and time: 07/30/16 0010   First Provider Initiated Contact with Patient 07/30/16 0101      Chief Complaint  Patient presents with  . Otalgia  . std check   HPI   Stacey Rodriguez is a 20 y.o. female G0P0000 here in MAU with abnormal vaginal discharge and ear pain. The vaginal discharge is yellow in color with an odor. " I just started bleeding 1 hour ago". She is here requesting STI testing. The pain in her ear is located in the left ear only. The pain has been present for 1 week.   There is a strong marijuana odor noted in the room.   OB History    Gravida Para Term Preterm AB Living   0 0 0 0 0 0   SAB TAB Ectopic Multiple Live Births   0 0 0 0 0      Past Medical History:  Diagnosis Date  . BV (bacterial vaginosis) 11/12/ 2010  . Genital herpes   . Yeast infection     Past Surgical History:  Procedure Laterality Date  . NO PAST SURGERIES      Family History  Problem Relation Age of Onset  . Asthma Mother     Social History  Substance Use Topics  . Smoking status: Never Smoker  . Smokeless tobacco: Never Used  . Alcohol use 3.0 oz/week    5 Shots of liquor per week     Comment: ocassionally    Allergies: No Known Allergies  Prescriptions Prior to Admission  Medication Sig Dispense Refill Last Dose  . cyclobenzaprine (FLEXERIL) 10 MG tablet Take 1 tablet (10 mg total) by mouth 3 (three) times daily as needed for muscle spasms (or pain). 9 tablet 0   . ibuprofen (ADVIL,MOTRIN) 800 MG tablet Take 1 tablet (800 mg total) by mouth every 8 (eight) hours as needed for mild pain or moderate pain. 15 tablet 0   . lidocaine (LIDODERM) 5 % Place 1 patch onto the skin daily. Remove & Discard patch within 12 hours or as directed by MD 30 patch 0   . metroNIDAZOLE (FLAGYL) 500 MG tablet Take 1 tablet (500 mg total) by mouth 2 (two) times daily. No alcohol while taking this medication 14 tablet 0   . valACYclovir (VALTREX)  500 MG tablet Take 1 tablet (500 mg total) by mouth 2 (two) times daily. For herpes infection (Patient not taking: Reported on 04/01/2016) 10 tablet 0 Not Taking at Unknown time   Results for orders placed or performed during the hospital encounter of 07/30/16 (from the past 48 hour(s))  Pregnancy, urine POC     Status: None   Collection Time: 07/30/16 12:35 AM  Result Value Ref Range   Preg Test, Ur NEGATIVE NEGATIVE    Comment:        THE SENSITIVITY OF THIS METHODOLOGY IS >24 mIU/mL   Wet prep, genital     Status: Abnormal   Collection Time: 07/30/16  1:04 AM  Result Value Ref Range   Yeast Wet Prep HPF POC PRESENT (A) NONE SEEN   Trich, Wet Prep NONE SEEN NONE SEEN   Clue Cells Wet Prep HPF POC PRESENT (A) NONE SEEN   WBC, Wet Prep HPF POC MANY (A) NONE SEEN    Comment: MANY BACTERIA SEEN   Sperm NONE SEEN     Review of Systems  Constitutional: Negative for chills and fever.  HENT: Positive for ear  pain (Left ear pain ). Negative for hearing loss.   Gastrointestinal: Negative for abdominal pain, nausea and vomiting.  Genitourinary: Negative for dysuria.   Physical Exam   Blood pressure 111/56, pulse 93, temperature 97.7 F (36.5 C), resp. rate 18, height 5\' 7"  (1.702 m), weight 134 lb 6.4 oz (61 kg).  Physical Exam  Constitutional: She is oriented to person, place, and time. She appears well-developed and well-nourished. No distress.  HENT:  Head: Normocephalic.  Left Ear: Hearing normal. There is tenderness. Tympanic membrane is erythematous and bulging.  Eyes: Pupils are equal, round, and reactive to light.  Genitourinary:  Genitourinary Comments: Wet prep and GC collected without speculum   Musculoskeletal: Normal range of motion.  Neurological: She is alert and oriented to person, place, and time.  Skin: She is not diaphoretic.    MAU Course  Procedures  None  MDM  Wet prep GCC  Assessment and Plan   A:  1. Yeast vaginitis   2. BV (bacterial  vaginosis)   3. Acute infection of left ear     P:  Discharge home in stable condition Rx: Amoxicillin, Flagyl, Diflucan Follow up with PCP Return to MAU for emergencies only.  Duane LopeJennifer I Rasch, NP 07/31/2016  9:29 AM

## 2016-07-30 NOTE — MAU Note (Signed)
L ear pain since Friday. Think I have BV. Have had it before and having same symptoms. Vaginal bleeding for an hour and yellow d/c for 2 days. On Depo for birth control.

## 2016-07-30 NOTE — Discharge Instructions (Signed)
Bacterial Vaginosis Bacterial vaginosis is an infection of the vagina. It happens when too many germs (bacteria) grow in the vagina. This infection puts you at risk for infections from sex (STIs). Treating this infection can lower your risk for some STIs. You should also treat this if you are pregnant. It can cause your baby to be born early. Follow these instructions at home: Medicines  Take over-the-counter and prescription medicines only as told by your doctor.  Take or use your antibiotic medicine as told by your doctor. Do not stop taking or using it even if you start to feel better. General instructions  If you your sexual partner is a woman, tell her that you have this infection. She needs to get treatment if she has symptoms. If you have a female partner, he does not need to be treated.  During treatment:  Avoid sex.  Do not douche.  Avoid alcohol as told.  Avoid breastfeeding as told.  Drink enough fluid to keep your pee (urine) clear or pale yellow.  Keep your vagina and butt (rectum) clean.  Wash the area with warm water every day.  Wipe from front to back after you use the toilet.  Keep all follow-up visits as told by your doctor. This is important. Preventing this condition  Do not douche.  Use only warm water to wash around your vagina.  Use protection when you have sex. This includes:  Latex condoms.  Dental dams.  Limit how many people you have sex with. It is best to only have sex with the same person (be monogamous).  Get tested for STIs. Have your partner get tested.  Wear underwear that is cotton or lined with cotton.  Avoid tight pants and pantyhose. This is most important in summer.  Do not use any products that have nicotine or tobacco in them. These include cigarettes and e-cigarettes. If you need help quitting, ask your doctor.  Do not use illegal drugs.  Limit how much alcohol you drink. Contact a doctor if:  Your symptoms do not get  better, even after you are treated.  You have more discharge or pain when you pee (urinate).  You have a fever.  You have pain in your belly (abdomen).  You have pain with sex.  Your bleed from your vagina between periods. Summary  This infection happens when too many germs (bacteria) grow in the vagina.  Treating this condition can lower your risk for some infections from sex (STIs).  You should also treat this if you are pregnant. It can cause early (premature) birth.  Do not stop taking or using your antibiotic medicine even if you start to feel better. This information is not intended to replace advice given to you by your health care provider. Make sure you discuss any questions you have with your health care provider. Document Released: 05/09/2008 Document Revised: 04/15/2016 Document Reviewed: 04/15/2016 Elsevier Interactive Patient Education  2017 [employer]vier Inc.  Otitis Media, Adult Otitis media is redness, soreness, and puffiness (swelling) in the space just behind your eardrum (middle ear). It may be caused by allergies or infection. It often happens along with a cold. Follow these instructions at home:  Take your medicine as told. Finish it even if you start to feel better.  Only take over-the-counter or prescription medicines for pain, discomfort, or fever as told by your doctor.  Follow up with your doctor as told. Contact a doctor if:  You have otitis media only in one ear, or  bleeding from your nose, or both.  You notice a lump on your neck.  You are not getting better in 3-5 days.  You feel worse instead of better. Get help right away if:  You have pain that is not helped with medicine.  You have puffiness, redness, or pain around your ear.  You get a stiff neck.  You cannot move part of your face (paralysis).  You notice that the bone behind your ear hurts when you touch it. This information is not intended to replace advice given to you by your  health care provider. Make sure you discuss any questions you have with your health care provider. Document Released: 01/17/2008 Document Revised: 01/06/2016 Document Reviewed: 02/25/2013 Elsevier Interactive Patient Education  2017 [employer]

## 2016-07-31 LAB — GC/CHLAMYDIA PROBE AMP (~~LOC~~) NOT AT ARMC
Chlamydia: NEGATIVE
Neisseria Gonorrhea: NEGATIVE

## 2016-08-25 ENCOUNTER — Inpatient Hospital Stay (HOSPITAL_COMMUNITY)
Admission: AD | Admit: 2016-08-25 | Discharge: 2016-08-25 | Disposition: A | Discharge status: Home / Self Care | Payer: Medicaid Other | Source: Ambulatory Visit | Attending: Obstetrics & Gynecology | Admitting: Obstetrics & Gynecology

## 2016-08-25 DIAGNOSIS — B3731 Acute candidiasis of vulva and vagina: Secondary | ICD-10-CM

## 2016-08-25 DIAGNOSIS — B373 Candidiasis of vulva and vagina: Secondary | ICD-10-CM | POA: Insufficient documentation

## 2016-08-25 LAB — URINALYSIS, ROUTINE W REFLEX MICROSCOPIC
Bilirubin Urine: NEGATIVE
GLUCOSE, UA: NEGATIVE mg/dL
Hgb urine dipstick: NEGATIVE
KETONES UR: NEGATIVE mg/dL
Nitrite: NEGATIVE
PH: 6 (ref 5.0–8.0)
Protein, ur: NEGATIVE mg/dL
SPECIFIC GRAVITY, URINE: 1.019 (ref 1.005–1.030)

## 2016-08-25 LAB — POCT PREGNANCY, URINE: Preg Test, Ur: NEGATIVE

## 2016-08-25 MED ORDER — FLUCONAZOLE 150 MG PO TABS: ORAL_TABLET | ORAL | 0 refills | Status: DC

## 2016-08-25 NOTE — Discharge Instructions (Signed)

## 2016-08-25 NOTE — MAU Note (Signed)
"  I know for a fact that I have a yeast infection", doesn't know why it didn't go away the last time. Has a heavy d/c with itching

## 2016-08-25 NOTE — MAU Provider Note (Signed)
History     CSN: [id number]  Arrival date and time: 08/25/16 1717   First Provider Initiated Contact with Patient 08/25/16 1746      Chief Complaint  Patient presents with  . Vaginal Discharge  . Vaginal Itching   HPI  Ms. Stacey Rodriguez is a 21 y.o. G0P0000 who presents to MAU today with complaint of vaginal itching and irritation. She is also having a large amount of thick, white, clumpy discharge. She states that she was seen in MAU last month and treated for BV, yeast and an ear infection. She states that symptoms improved and then returned. She denies vaginal bleeding, abdominal pain or fever.    OB History    Gravida Para Term Preterm AB Living   0 0 0 0 0 0   SAB TAB Ectopic Multiple Live Births   0 0 0 0 0      Past Medical History:  Diagnosis Date  . BV (bacterial vaginosis) 11/12/ 2010  . Genital herpes   . Yeast infection     Past Surgical History:  Procedure Laterality Date  . NO PAST SURGERIES      Family History  Problem Relation Age of Onset  . Asthma Mother     Social History  Substance Use Topics  . Smoking status: Never Smoker  . Smokeless tobacco: Never Used  . Alcohol use 3.0 oz/week    5 Shots of liquor per week     Comment: ocassionally    Allergies: No Known Allergies  No prescriptions prior to admission.    Review of Systems  Constitutional: Negative for fever.  Gastrointestinal: Negative for abdominal pain, constipation, diarrhea, nausea and vomiting.  Genitourinary: Positive for vaginal discharge. Negative for dysuria, frequency, urgency and vaginal bleeding.   Physical Exam   Blood pressure 111/66, pulse 85, temperature 98.4 F (36.9 C), temperature source Oral, resp. rate 16, weight 136 lb (61.7 kg).  Physical Exam  Nursing note and vitals reviewed. Constitutional: She is oriented to person, place, and time. She appears well-developed and well-nourished. No distress.  HENT:  Head: Normocephalic and atraumatic.   Cardiovascular: Normal rate.   Respiratory: Effort normal.  GI: Soft. She exhibits no distension.  Neurological: She is alert and oriented to person, place, and time.  Skin: Skin is warm and dry. No erythema.  Psychiatric: She has a normal mood and affect.    Results for orders placed or performed during the hospital encounter of 08/25/16 (from the past 24 hour(s))  Urinalysis, Routine w reflex microscopic     Status: Abnormal   Collection Time: 08/25/16  5:32 PM  Result Value Ref Range   Color, Urine YELLOW YELLOW   APPearance HAZY (A) CLEAR   Specific Gravity, Urine 1.019 1.005 - 1.030   pH 6.0 5.0 - 8.0   Glucose, UA NEGATIVE NEGATIVE mg/dL   Hgb urine dipstick NEGATIVE NEGATIVE   Bilirubin Urine NEGATIVE NEGATIVE   Ketones, ur NEGATIVE NEGATIVE mg/dL   Protein, ur NEGATIVE NEGATIVE mg/dL   Nitrite NEGATIVE NEGATIVE   Leukocytes, UA LARGE (A) NEGATIVE   RBC / HPF 6-30 0 - 5 RBC/hpf   WBC, UA 6-30 0 - 5 WBC/hpf   Bacteria, UA RARE (A) NONE SEEN   Squamous Epithelial / LPF 6-30 (A) NONE SEEN  Pregnancy, urine POC     Status: None   Collection Time: 08/25/16  5:41 PM  Result Value Ref Range   Preg Test, Ur NEGATIVE NEGATIVE    MAU Course  Procedures None   MDM UPT - negative UA today   Assessment and Plan  A: Yeast vulvovaginitis, clinical   P: Discharge home Rx for Diflucan given to patient  Patient advised to follow-up with [facility] as needed for recurrent symptoms  Patient may return to MAU as needed or if her condition were to change or worsen   Marny LowensteinJulie N Wenzel, PA-C  08/25/2016, 7:04 PM

## 2016-10-14 ENCOUNTER — Inpatient Hospital Stay (HOSPITAL_COMMUNITY)
Admission: AD | Admit: 2016-10-14 | Discharge: 2016-10-14 | Disposition: A | Discharge status: Home / Self Care | Payer: Medicaid Other | Source: Ambulatory Visit | Attending: Obstetrics and Gynecology | Admitting: Obstetrics and Gynecology

## 2016-10-14 ENCOUNTER — Encounter (HOSPITAL_COMMUNITY): Payer: Self-pay | Admitting: *Deleted

## 2016-10-14 DIAGNOSIS — N760 Acute vaginitis: Secondary | ICD-10-CM

## 2016-10-14 DIAGNOSIS — Z825 Family history of asthma and other chronic lower respiratory diseases: Secondary | ICD-10-CM | POA: Insufficient documentation

## 2016-10-14 DIAGNOSIS — B009 Herpesviral infection, unspecified: Secondary | ICD-10-CM | POA: Insufficient documentation

## 2016-10-14 DIAGNOSIS — B9689 Other specified bacterial agents as the cause of diseases classified elsewhere: Secondary | ICD-10-CM | POA: Insufficient documentation

## 2016-10-14 DIAGNOSIS — N644 Mastodynia: Secondary | ICD-10-CM | POA: Insufficient documentation

## 2016-10-14 LAB — URINALYSIS, ROUTINE W REFLEX MICROSCOPIC
Bilirubin Urine: NEGATIVE
GLUCOSE, UA: NEGATIVE mg/dL
Hgb urine dipstick: NEGATIVE
KETONES UR: NEGATIVE mg/dL
LEUKOCYTES UA: NEGATIVE
Nitrite: NEGATIVE
PH: 5 (ref 5.0–8.0)
Protein, ur: NEGATIVE mg/dL
Specific Gravity, Urine: 1.019 (ref 1.005–1.030)

## 2016-10-14 LAB — RPR: RPR: NONREACTIVE

## 2016-10-14 LAB — HIV ANTIBODY (ROUTINE TESTING W REFLEX): HIV SCREEN 4TH GENERATION: NONREACTIVE

## 2016-10-14 LAB — WET PREP, GENITAL
SPERM: NONE SEEN
Trich, Wet Prep: NONE SEEN
Yeast Wet Prep HPF POC: NONE SEEN

## 2016-10-14 LAB — POCT PREGNANCY, URINE: Preg Test, Ur: NEGATIVE

## 2016-10-14 MED ORDER — METRONIDAZOLE 500 MG PO TABS: 500.0000 mg | ORAL_TABLET | Freq: Two times a day (BID) | ORAL | 0 refills | Status: DC

## 2016-10-14 MED ORDER — FLUCONAZOLE 150 MG PO TABS: ORAL_TABLET | ORAL | 0 refills | Status: DC

## 2016-10-14 NOTE — MAU Note (Signed)
I think i'm having a herpes outbreak. Noticed it yesterday. I took Valtrex yesterday and today. Want to be checked for STDs. Having vag d/c that is yellow. Having vaginal pain and pain from outbreak.

## 2016-10-14 NOTE — MAU Provider Note (Signed)
History     CSN: [id number]  Arrival date and time: 10/14/16 0231   First Provider Initiated Contact with Patient 10/14/16 0301      Chief Complaint  Patient presents with  . nipples sore   HPI Ms. Stacey Rodriguez is a 21 y.o. G0P0000 who presents to MAU today with complaint of herpes outbreak and nipple pain. Patient also desires STD testing due to thick, yellow vaginal discharge. She denies vaginal bleeding, pelvic pain, fever, vaginal odor today. She states 1 partner and uses condoms sometimes. She states bilateral nipple soreness x 1 week. She denies rash or irritation, but has been rubbing the area when pain arises. She states outbreak of HSV noted Thursday and started Valtrex yesterday.   OB History    Gravida Para Term Preterm AB Living   0 0 0 0 0 0   SAB TAB Ectopic Multiple Live Births   0 0 0 0 0      Past Medical History:  Diagnosis Date  . BV (bacterial vaginosis) 11/12/ 2010  . Genital herpes   . Yeast infection     Past Surgical History:  Procedure Laterality Date  . NO PAST SURGERIES      Family History  Problem Relation Age of Onset  . Asthma Mother     Social History  Substance Use Topics  . Smoking status: Never Smoker  . Smokeless tobacco: Never Used  . Alcohol use 3.0 oz/week    5 Shots of liquor per week     Comment: ocassionally    Allergies: No Known Allergies  No prescriptions prior to admission.    Review of Systems  Constitutional: Negative for fever.  Gastrointestinal: Negative for abdominal pain, constipation, diarrhea, nausea and vomiting.  Genitourinary: Positive for vaginal discharge and vaginal pain. Negative for vaginal bleeding.   Physical Exam   Blood pressure 101/57, pulse 80, temperature 97.9 F (36.6 C), temperature source Oral, resp. rate 18, height 5\' 7"  (1.702 m), weight 140 lb 6.4 oz (63.7 kg), last menstrual period 08/18/2016.  Physical Exam  Nursing note and vitals reviewed. Constitutional: She is  oriented to person, place, and time. She appears well-developed and well-nourished. No distress.  HENT:  Head: Normocephalic and atraumatic.  Cardiovascular: Normal rate.   Respiratory: Effort normal. Right breast exhibits no inverted nipple, no mass, no nipple discharge, no skin change and no tenderness. Left breast exhibits no inverted nipple, no mass, no nipple discharge, no skin change and no tenderness. Breasts are symmetrical.  GI: Soft. She exhibits no distension and no mass. There is no tenderness. There is no rebound and no guarding.  Genitourinary:    Uterus is not enlarged and not tender. Cervix exhibits no motion tenderness, no discharge and no friability. Right adnexum displays no mass and no tenderness. Left adnexum displays no mass and no tenderness. No bleeding in the vagina. Vaginal discharge (moderate amount of thin, white-yellow discharge noted) found.  Neurological: She is alert and oriented to person, place, and time.  Skin: Skin is warm and dry. No erythema.  Psychiatric: She has a normal mood and affect.   Results for orders placed or performed during the hospital encounter of 10/14/16 (from the past 24 hour(s))  Urinalysis, Routine w reflex microscopic     Status: None   Collection Time: 10/14/16  2:50 AM  Result Value Ref Range   Color, Urine YELLOW YELLOW   APPearance CLEAR CLEAR   Specific Gravity, Urine 1.019 1.005 - 1.030   pH  5.0 5.0 - 8.0   Glucose, UA NEGATIVE NEGATIVE mg/dL   Hgb urine dipstick NEGATIVE NEGATIVE   Bilirubin Urine NEGATIVE NEGATIVE   Ketones, ur NEGATIVE NEGATIVE mg/dL   Protein, ur NEGATIVE NEGATIVE mg/dL   Nitrite NEGATIVE NEGATIVE   Leukocytes, UA NEGATIVE NEGATIVE  Wet prep, genital     Status: Abnormal   Collection Time: 10/14/16  3:12 AM  Result Value Ref Range   Yeast Wet Prep HPF POC NONE SEEN NONE SEEN   Trich, Wet Prep NONE SEEN NONE SEEN   Clue Cells Wet Prep HPF POC PRESENT (A) NONE SEEN   WBC, Wet Prep HPF POC FEW (A)  NONE SEEN   Sperm NONE SEEN   Pregnancy, urine POC     Status: None   Collection Time: 10/14/16  3:14 AM  Result Value Ref Range   Preg Test, Ur NEGATIVE NEGATIVE    MAU Course  Procedures None  MDM UPT - negative UA, Wet prep, GC/chlamydia, HIV, RPR today   Assessment and Plan  A: HSV, recurrent Nipple pain  Bacterial vaginosis  P: Discharge home Rx for Flagyl and Diflucan given to patient, as patient states history of yeast infection after antibiotic use  Continue Valtrex as previously prescribed Warning signs for worsening condition discussed Discussed use of moisturizer, ice and Ibuprofen for nipple pain Advised to discontinue nipple stimulation to avoid continued pain  Patient advised to follow-up with GYN/PCP of choice for routine care Patient may return to MAU as needed or if her condition were to change or worsen   Marny LowensteinJulie N Wenzel, PA-C  10/14/2016, 5:04 AM

## 2016-10-14 NOTE — Discharge Instructions (Signed)

## 2016-10-14 NOTE — Progress Notes (Signed)
Julie Wenzel PA in earlier to discuss test results and d/c plan. Written and verbal d/c instructions given and understanding voiced 

## 2016-10-16 ENCOUNTER — Other Ambulatory Visit: Payer: Self-pay | Admitting: Student

## 2016-10-16 ENCOUNTER — Telehealth (HOSPITAL_COMMUNITY): Payer: Self-pay

## 2016-10-16 DIAGNOSIS — A749 Chlamydial infection, unspecified: Secondary | ICD-10-CM

## 2016-10-16 LAB — GC/CHLAMYDIA PROBE AMP (~~LOC~~) NOT AT ARMC
CHLAMYDIA, DNA PROBE: POSITIVE — AB
Neisseria Gonorrhea: NEGATIVE

## 2016-10-16 MED ORDER — AZITHROMYCIN 500 MG PO TABS: 1000.0000 mg | ORAL_TABLET | Freq: Once | ORAL | 0 refills | Status: AC

## 2016-10-16 NOTE — Telephone Encounter (Signed)

## 2016-11-04 ENCOUNTER — Emergency Department (HOSPITAL_COMMUNITY)
Admission: EM | Admit: 2016-11-04 | Discharge: 2016-11-04 | Disposition: A | Discharge status: Home / Self Care | Payer: Self-pay | Attending: Emergency Medicine | Admitting: Emergency Medicine

## 2016-11-04 ENCOUNTER — Emergency Department (HOSPITAL_COMMUNITY): Payer: Self-pay

## 2016-11-04 ENCOUNTER — Encounter (HOSPITAL_COMMUNITY): Payer: Self-pay

## 2016-11-04 DIAGNOSIS — R072 Precordial pain: Secondary | ICD-10-CM | POA: Insufficient documentation

## 2016-11-04 LAB — CBC
HEMATOCRIT: 34.4 % — AB (ref 36.0–46.0)
Hemoglobin: 12.1 g/dL (ref 12.0–15.0)
MCH: 31.3 pg (ref 26.0–34.0)
MCHC: 35.2 g/dL (ref 30.0–36.0)
MCV: 89.1 fL (ref 78.0–100.0)
PLATELETS: 147 10*3/uL — AB (ref 150–400)
RBC: 3.86 MIL/uL — ABNORMAL LOW (ref 3.87–5.11)
RDW: 13.2 % (ref 11.5–15.5)
WBC: 6.7 10*3/uL (ref 4.0–10.5)

## 2016-11-04 LAB — COMPREHENSIVE METABOLIC PANEL
ALBUMIN: 4.8 g/dL (ref 3.5–5.0)
ALT: 24 U/L (ref 14–54)
AST: 37 U/L (ref 15–41)
Alkaline Phosphatase: 49 U/L (ref 38–126)
Anion gap: 14 (ref 5–15)
BILIRUBIN TOTAL: 0.8 mg/dL (ref 0.3–1.2)
BUN: 13 mg/dL (ref 6–20)
CHLORIDE: 106 mmol/L (ref 101–111)
CO2: 19 mmol/L — ABNORMAL LOW (ref 22–32)
Calcium: 10.2 mg/dL (ref 8.9–10.3)
Creatinine, Ser: 0.85 mg/dL (ref 0.44–1.00)
GFR calc Af Amer: >60 mL/min (ref >60)
Glucose, Bld: 152 mg/dL — ABNORMAL HIGH (ref 65–99)
POTASSIUM: 3.6 mmol/L (ref 3.5–5.1)
SODIUM: 139 mmol/L (ref 135–145)
TOTAL PROTEIN: 7.4 g/dL (ref 6.5–8.1)

## 2016-11-04 LAB — LIPASE, BLOOD: Lipase: <10 U/L — ABNORMAL LOW (ref 11–51)

## 2016-11-04 LAB — I-STAT BETA HCG BLOOD, ED (MC, WL, AP ONLY): I-stat hCG, quantitative: <5 m[IU]/mL (ref <5)

## 2016-11-04 LAB — D-DIMER, QUANTITATIVE (NOT AT ARMC): D DIMER QUANT: 1.14 ug{FEU}/mL — AB (ref 0.00–0.50)

## 2016-11-04 MED ORDER — RANITIDINE HCL 150 MG PO CAPS: 150.0000 mg | ORAL_CAPSULE | Freq: Every day | ORAL | 0 refills | Status: DC | PRN

## 2016-11-04 MED ORDER — IOPAMIDOL (ISOVUE-370) INJECTION 76%
INTRAVENOUS | Status: AC
  Administered 2016-11-04: 12:00:00
  Filled 2016-11-04: qty 100

## 2016-11-04 MED ORDER — SODIUM CHLORIDE 0.9 % IV BOLUS (SEPSIS)
1000.0000 mL | Freq: Once | INTRAVENOUS | Status: AC
  Administered 2016-11-04: 1000 mL via INTRAVENOUS

## 2016-11-04 MED ORDER — LORAZEPAM 0.5 MG PO TABS
0.5000 mg | ORAL_TABLET | Freq: Once | ORAL | Status: AC
  Administered 2016-11-04: 0.5 mg via ORAL
  Filled 2016-11-04: qty 1

## 2016-11-04 MED ORDER — ONDANSETRON HCL 4 MG/2ML IJ SOLN
4.0000 mg | Freq: Once | INTRAMUSCULAR | Status: AC
  Administered 2016-11-04: 4 mg via INTRAVENOUS
  Filled 2016-11-04: qty 2

## 2016-11-04 MED ORDER — LORAZEPAM 2 MG/ML IJ SOLN
1.0000 mg | Freq: Once | INTRAMUSCULAR | Status: AC
  Administered 2016-11-04: 1 mg via INTRAVENOUS
  Filled 2016-11-04: qty 1

## 2016-11-04 NOTE — ED Notes (Signed)
Patient transported to CT 

## 2016-11-04 NOTE — Discharge Instructions (Signed)
Please read instructions below. Talk with your primary care provider about any new medications. Return for chest pain, shortness of breath, vomiting blood, fever.

## 2016-11-04 NOTE — ED Triage Notes (Signed)
Pt arrived via POV c/o central chest pain, pt was drinking earlier this morning states she has vomited several times this morning.

## 2016-11-04 NOTE — ED Provider Notes (Signed)
[facility]-EMERGENCY DEPT Provider Note   CSN: [id number] Arrival date & time: 11/04/16  4098     History   Chief Complaint Chief Complaint  Patient presents with  . Chest Pain  . Alcohol Intoxication    HPI Stacey Rodriguez is a 21 y.o. female.  Pt presents w vomiting and chest pain/tightness since late last night. Reports she drank about 5 shots of liquor and began vomiting around 11pm. She reports chest pain/tightness that comes and goes every couple of minutes assoc w SOB. Denies hematemesis, nonbillious emesis, pain w breathing. Denies cardiac hx, asthma, other chronic illnesses. Does not currently take any daily medications.       Past Medical History:  Diagnosis Date  . BV (bacterial vaginosis) 11/12/ 2010  . Genital herpes   . Yeast infection     Patient Active Problem List   Diagnosis Date Noted  . Substance abuse 11/11/2015  . Adjustment disorder with mixed anxiety and depressed mood 11/11/2015  . Vaginal discharge 11/12/2012  . ECZEMA 05/27/2008  . ACNE 05/27/2008    Past Surgical History:  Procedure Laterality Date  . NO PAST SURGERIES      OB History    Gravida Para Term Preterm AB Living   0 0 0 0 0 0   SAB TAB Ectopic Multiple Live Births   0 0 0 0 0       Home Medications    Prior to Admission medications   Medication Sig Start Date End Date Taking? Authorizing Provider  cyclobenzaprine (FLEXERIL) 10 MG tablet Take 1 tablet (10 mg total) by mouth 3 (three) times daily as needed for muscle spasms (or pain). 05/29/16   Trixie Dredge, PA-C  fluconazole (DIFLUCAN) 150 MG tablet Take 1 tab (150 mg) today and 1 tab (150 mg) in 3 days if symptoms persist 10/14/16   Marny Lowenstein, PA-C  ibuprofen (ADVIL,MOTRIN) 800 MG tablet Take 1 tablet (800 mg total) by mouth every 8 (eight) hours as needed for mild pain or moderate pain. 05/29/16   Trixie Dredge, PA-C  metroNIDAZOLE (FLAGYL) 500 MG tablet Take 1 tablet (500 mg total) by mouth 2 (two) times daily. 10/14/16    Marny Lowenstein, PA-C  ranitidine (ZANTAC) 150 MG capsule Take 1 capsule (150 mg total) by mouth daily as needed for heartburn. 11/04/16   [place] Nicole Russo, PA-C  valACYclovir (VALTREX) 500 MG tablet Take 1 tablet (500 mg total) by mouth 2 (two) times daily. For herpes infection 11/11/15   Sanjuana Kava, NP    Family History Family History  Problem Relation Age of Onset  . Asthma Mother     Social History Social History  Substance Use Topics  . Smoking status: Never Smoker  . Smokeless tobacco: Never Used  . Alcohol use 3.0 oz/week    5 Shots of liquor per week     Comment: ocassionally     Allergies   Patient has no known allergies.   Review of Systems Review of Systems  Constitutional: Positive for chills. Negative for fever.  Respiratory: Positive for chest tightness (intermittent) and shortness of breath (SOB w episodes of CP/tightness).   Cardiovascular: Positive for chest pain (intermittent).  Gastrointestinal: Positive for abdominal pain, diarrhea, nausea and vomiting. Negative for blood in stool.       No hematemesis, non-billious emesis  Genitourinary: Negative for flank pain.  Musculoskeletal: Positive for back pain (mild upper back pain assoc w episodes CP). Negative for neck pain.  Psychiatric/Behavioral: The patient  is nervous/anxious.   All other systems reviewed and are negative.    Physical Exam Updated Vital Signs BP 135/85   Pulse 60   Resp 12   Ht 5\' 7"  (1.702 m)   Wt 61.2 kg   LMP 10/25/2016 (Exact Date)   SpO2 97%   BMI 21.14 kg/m   Physical Exam  Constitutional: She is oriented to person, place, and time. She appears well-developed and well-nourished.  Appears anxious  HENT:  Head: Normocephalic and atraumatic.  Eyes: Conjunctivae are normal.  Neck: Normal range of motion. Neck supple.  Cardiovascular: Normal rate, regular rhythm, normal heart sounds and intact distal pulses.   No murmur heard. Pulmonary/Chest: Effort normal and  breath sounds normal. No respiratory distress. She has no wheezes. She has no rales. She exhibits no tenderness.  Abdominal: Soft. Bowel sounds are normal. She exhibits no distension. There is no tenderness. There is no rebound and no guarding.  Musculoskeletal: Normal range of motion.  Neurological: She is alert and oriented to person, place, and time.  Skin: Skin is warm.  Psychiatric: Her behavior is normal.  Pt is anxious and worrisome  Nursing note and vitals reviewed.    ED Treatments / Results  Labs (all labs ordered are listed, but only abnormal results are displayed) Labs Reviewed  COMPREHENSIVE METABOLIC PANEL - Abnormal; Notable for the following:       Result Value   CO2 19 (*)    Glucose, Bld 152 (*)    All other components within normal limits  CBC - Abnormal; Notable for the following:    RBC 3.86 (*)    HCT 34.4 (*)    Platelets 147 (*)    All other components within normal limits  LIPASE, BLOOD - Abnormal; Notable for the following:    Lipase <10 (*)    All other components within normal limits  D-DIMER, QUANTITATIVE (NOT AT [facility]) - Abnormal; Notable for the following:    D-Dimer, Quant 1.14 (*)    All other components within normal limits  I-STAT BETA HCG BLOOD, ED ([facility], [facility], [facility] ONLY)    EKG  EKG Interpretation  Date/Time:  Saturday November 04 2016 06:57:36 EDT Ventricular Rate:  63 PR Interval:    QRS Duration: 94 QT Interval:  469 QTC Calculation: 481 R Axis:   65 Text Interpretation:  Sinus rhythm Probable left ventricular hypertrophy Borderline prolonged QT interval No prior ECG for comparison. No STEMI Confirmed by Rush Landmark MD, CHRISTOPHER [phone]) on 11/04/2016 7:21:44 AM       Radiology Ct Angio Chest Pe W/cm &/or Wo Cm  Result Date: 11/04/2016 CLINICAL DATA:  Shortness of breath and chest pain EXAM: CT ANGIOGRAPHY CHEST WITH CONTRAST TECHNIQUE: Multidetector CT imaging of the chest was performed using the standard protocol during bolus  administration of intravenous contrast. Multiplanar CT image reconstructions and MIPs were obtained to evaluate the vascular anatomy. CONTRAST:  80 mL Isovue 370 nonionic COMPARISON:  None. FINDINGS: Cardiovascular: There is no demonstrable pulmonary embolus. There is no thoracic aortic aneurysm or dissection. Visualized great vessels appear normal. No pericardial thickening or effusion evident. Mediastinum/Nodes: No thyroid lesion evident. There is no appreciable thoracic adenopathy. Lungs/Pleura: Lungs are clear. No pleural effusion or pleural thickening evident. Upper Abdomen: Visualized upper abdominal structures appear normal. Musculoskeletal: There are no blastic or lytic bone lesions. No evident chest wall lesions. Review of the MIP images confirms the above findings. IMPRESSION: No demonstrable pulmonary embolus. No edema or consolidation. No evident adenopathy. Electronically  Signed   By: Bretta BangWilliam  Woodruff III M.D.   On: 11/04/2016 12:05    Procedures Procedures (including critical care time)  Medications Ordered in ED Medications  ondansetron (ZOFRAN) injection 4 mg (4 mg Intravenous Given 11/04/16 0757)  sodium chloride 0.9 % bolus 1,000 mL (0 mLs Intravenous Stopped 11/04/16 0905)  LORazepam (ATIVAN) injection 1 mg (1 mg Intravenous Given 11/04/16 0818)  iopamidol (ISOVUE-370) 76 % injection (  Contrast Given 11/04/16 1145)  LORazepam (ATIVAN) tablet 0.5 mg (0.5 mg Oral Given 11/04/16 1158)     Initial Impression / Assessment and Plan / ED Course  I have reviewed the triage vital signs and the nursing notes.  Pertinent labs & imaging results that were available during my care of the patient were reviewed by me and considered in my medical decision making (see chart for details).  Clinical Course as of Nov 04 1216  Sat Nov 04, 2016  0815 Discussed pt w Dr. Rush Landmarkegeler  [JR]    Clinical Course User Index [JR] [place] Nicole Russo, PA-C    Pt presents w emesis and CP/tightness after EtOH  intoxication last night. CMP, CBC, lipase reassuring. D-dimer elevated, CTA showed no signs of PE or cardiac pathology. Suspect esophageal spasm vs anxiety vs esophagitis vs GERD. Pt is nontoxic, hemodynamically stable. Discharge home w zantac. Encouraged follow up with a PCP.  Patient discussed with Renne CriglerJoshua Geiple, PA-C and Dr. Rush Landmarkegeler.   Discussed results, findings, treatment and follow up. Patient advised of return precautions. Patient verbalized understanding and agreed with plan.   Final Clinical Impressions(s) / ED Diagnoses   Final diagnoses:  Precordial pain    New Prescriptions New Prescriptions   RANITIDINE (ZANTAC) 150 MG CAPSULE    Take 1 capsule (150 mg total) by mouth daily as needed for heartburn.     [place] Nicole Russo, PA-C 11/04/16 1220    Canary Brimhristopher J Tegeler, MD 11/04/16 2018

## 2016-11-04 NOTE — ED Notes (Signed)
Roommate/Friend's contact number: [phone]

## 2017-02-04 ENCOUNTER — Encounter (HOSPITAL_COMMUNITY): Payer: Self-pay | Admitting: Emergency Medicine

## 2017-02-04 ENCOUNTER — Emergency Department (HOSPITAL_COMMUNITY): Payer: Self-pay

## 2017-02-04 ENCOUNTER — Emergency Department (HOSPITAL_COMMUNITY)
Admission: EM | Admit: 2017-02-04 | Discharge: 2017-02-04 | Disposition: A | Discharge status: Home / Self Care | Payer: Self-pay | Attending: Emergency Medicine | Admitting: Emergency Medicine

## 2017-02-04 DIAGNOSIS — K2900 Acute gastritis without bleeding: Secondary | ICD-10-CM

## 2017-02-04 DIAGNOSIS — R079 Chest pain, unspecified: Secondary | ICD-10-CM

## 2017-02-04 DIAGNOSIS — R112 Nausea with vomiting, unspecified: Secondary | ICD-10-CM

## 2017-02-04 DIAGNOSIS — R0789 Other chest pain: Secondary | ICD-10-CM | POA: Insufficient documentation

## 2017-02-04 DIAGNOSIS — R197 Diarrhea, unspecified: Secondary | ICD-10-CM | POA: Insufficient documentation

## 2017-02-04 LAB — COMPREHENSIVE METABOLIC PANEL
ALBUMIN: 4.5 g/dL (ref 3.5–5.0)
ALT: 16 U/L (ref 14–54)
ANION GAP: 13 (ref 5–15)
AST: 25 U/L (ref 15–41)
Alkaline Phosphatase: 48 U/L (ref 38–126)
BILIRUBIN TOTAL: 0.5 mg/dL (ref 0.3–1.2)
BUN: 13 mg/dL (ref 6–20)
CO2: 20 mmol/L — ABNORMAL LOW (ref 22–32)
Calcium: 9.8 mg/dL (ref 8.9–10.3)
Chloride: 107 mmol/L (ref 101–111)
Creatinine, Ser: 0.83 mg/dL (ref 0.44–1.00)
GFR calc Af Amer: >60 mL/min (ref >60)
Glucose, Bld: 163 mg/dL — ABNORMAL HIGH (ref 65–99)
Potassium: 3.5 mmol/L (ref 3.5–5.1)
Sodium: 140 mmol/L (ref 135–145)
TOTAL PROTEIN: 7.2 g/dL (ref 6.5–8.1)

## 2017-02-04 LAB — URINALYSIS, ROUTINE W REFLEX MICROSCOPIC
BACTERIA UA: NONE SEEN
Bilirubin Urine: NEGATIVE
Glucose, UA: 150 mg/dL — AB
Ketones, ur: 20 mg/dL — AB
LEUKOCYTES UA: NEGATIVE
NITRITE: NEGATIVE
Protein, ur: NEGATIVE mg/dL
Specific Gravity, Urine: 1.019 (ref 1.005–1.030)
pH: 8 (ref 5.0–8.0)

## 2017-02-04 LAB — CBC
HEMATOCRIT: 33.8 % — AB (ref 36.0–46.0)
HEMOGLOBIN: 11.9 g/dL — AB (ref 12.0–15.0)
MCH: 31.1 pg (ref 26.0–34.0)
MCHC: 35.2 g/dL (ref 30.0–36.0)
MCV: 88.3 fL (ref 78.0–100.0)
Platelets: 138 10*3/uL — ABNORMAL LOW (ref 150–400)
RBC: 3.83 MIL/uL — ABNORMAL LOW (ref 3.87–5.11)
RDW: 12.4 % (ref 11.5–15.5)
WBC: 7.5 10*3/uL (ref 4.0–10.5)

## 2017-02-04 LAB — I-STAT BETA HCG BLOOD, ED (MC, WL, AP ONLY)

## 2017-02-04 LAB — I-STAT TROPONIN, ED: Troponin i, poc: 0.01 ng/mL (ref 0.00–0.08)

## 2017-02-04 LAB — LIPASE, BLOOD: LIPASE: 25 U/L (ref 11–51)

## 2017-02-04 MED ORDER — ONDANSETRON HCL 4 MG/2ML IJ SOLN
4.0000 mg | Freq: Once | INTRAMUSCULAR | Status: AC
Start: 2017-02-04 — End: 2017-02-04
  Administered 2017-02-04: 4 mg via INTRAVENOUS
  Filled 2017-02-04: qty 2

## 2017-02-04 MED ORDER — ONDANSETRON 4 MG PO TBDP
ORAL_TABLET | ORAL | Status: AC
  Filled 2017-02-04: qty 2

## 2017-02-04 MED ORDER — RANITIDINE HCL 150 MG PO CAPS
150.0000 mg | ORAL_CAPSULE | Freq: Every day | ORAL | 0 refills | Status: DC
Start: 2017-02-04 — End: 2017-11-26

## 2017-02-04 MED ORDER — SODIUM CHLORIDE 0.9 % IV BOLUS (SEPSIS)
1000.0000 mL | Freq: Once | INTRAVENOUS | Status: AC
  Administered 2017-02-04: 1000 mL via INTRAVENOUS

## 2017-02-04 MED ORDER — ONDANSETRON 4 MG PO TBDP: 4.0000 mg | ORAL_TABLET | Freq: Three times a day (TID) | ORAL | 0 refills | Status: DC | PRN

## 2017-02-04 MED ORDER — HALOPERIDOL LACTATE 5 MG/ML IJ SOLN
5.0000 mg | Freq: Once | INTRAMUSCULAR | Status: AC
  Administered 2017-02-04: 5 mg via INTRAVENOUS
  Filled 2017-02-04: qty 1

## 2017-02-04 MED ORDER — ONDANSETRON 4 MG PO TBDP
8.0000 mg | ORAL_TABLET | Freq: Once | ORAL | Status: AC
  Administered 2017-02-04: 8 mg via ORAL

## 2017-02-04 MED ORDER — DICYCLOMINE HCL 20 MG PO TABS: 20.0000 mg | ORAL_TABLET | Freq: Two times a day (BID) | ORAL | 0 refills | Status: DC | PRN

## 2017-02-04 NOTE — ED Triage Notes (Signed)
Pt c/o 10/10 generalized abd pain, with nausea and vomiting. No fever or chills.

## 2017-02-04 NOTE — ED Notes (Signed)
MD Schlossman at the bedside  

## 2017-02-04 NOTE — ED Notes (Addendum)
XRay Transporter walked out and told this RN that the patient was in the floor. When RN went into the room, pt was in the bed. Mother at the bedside stated, "She got out of bed stood over by the rail and then laid down in the floor." Pt and mother denied fall and stated she laid down in the floor. Pt covered back in bed and sent with XRay Transported. Pt has fall risk band and socks placed on before this time period. Pt has no injuries and denies falling. Pt stated, "I can't breathe." Pt dramatically throwing arms and legs around in the bed. Pt's VS completed stable. Lung sounds clear. Transporter advised to tell Xray tech to take XRay in the bed for 2 View picture.

## 2017-02-04 NOTE — ED Provider Notes (Signed)
[facility] Provider Note   CSN: [id number] Arrival date & time: 02/04/17  [id number]     History   Chief Complaint Chief Complaint  Patient presents with  . Abdominal Pain    HPI Stacey Rodriguez is a 21 y.o. female.  HPI   Since 4Am has felt like had trouble breathing and chest and stomach pain. Chest hurts in middle and at bottom and feels like can't breath right unless asleep.  CP feels sharp. Abdominal pain in top and in middle.  Nausea, vomiting, reports throwing up a lot but not specific number.  Diarrhea twice.  No fever, no cough, no leg pain or swelling, no urinary symptoms, no vaginal discharge. On period now.  One time had similar symptoms. Smoked weed, no other medications, denies other drug or etoh use.   Past Medical History:  Diagnosis Date  . BV (bacterial vaginosis) 11/12/ 2010  . Genital herpes   . Yeast infection     Patient Active Problem List   Diagnosis Date Noted  . Substance abuse 11/11/2015  . Adjustment disorder with mixed anxiety and depressed mood 11/11/2015  . Vaginal discharge 11/12/2012  . ECZEMA 05/27/2008  . ACNE 05/27/2008    Past Surgical History:  Procedure Laterality Date  . NO PAST SURGERIES      OB History    Gravida Para Term Preterm AB Living   0 0 0 0 0 0   SAB TAB Ectopic Multiple Live Births   0 0 0 0 0       Home Medications    Prior to Admission medications   Medication Sig Start Date End Date Taking? Authorizing Provider  cyclobenzaprine (FLEXERIL) 10 MG tablet Take 1 tablet (10 mg total) by mouth 3 (three) times daily as needed for muscle spasms (or pain). Patient not taking: Reported on 02/04/2017 05/29/16   Trixie DredgeWest, Emily, PA-C  dicyclomine (BENTYL) 20 MG tablet Take 1 tablet (20 mg total) by mouth 2 (two) times daily as needed for spasms (abdominal pain). 02/04/17   Alvira MondaySchlossman, Parker Sawatzky, MD  ibuprofen (ADVIL,MOTRIN) 800 MG tablet Take 1 tablet (800 mg total) by mouth every 8 (eight) hours as needed for mild  pain or moderate pain. Patient not taking: Reported on 02/04/2017 05/29/16   Trixie DredgeWest, Emily, PA-C  ondansetron (ZOFRAN ODT) 4 MG disintegrating tablet Take 1 tablet (4 mg total) by mouth every 8 (eight) hours as needed for nausea or vomiting. 02/04/17   Alvira MondaySchlossman, Marsalis Beaulieu, MD  ranitidine (ZANTAC) 150 MG capsule Take 1 capsule (150 mg total) by mouth daily. 02/04/17   Alvira MondaySchlossman, Tierra Thoma, MD  valACYclovir (VALTREX) 500 MG tablet Take 1 tablet (500 mg total) by mouth 2 (two) times daily. For herpes infection Patient not taking: Reported on 02/04/2017 11/11/15   Sanjuana KavaNwoko, Agnes I, NP    Family History Family History  Problem Relation Age of Onset  . Asthma Mother     Social History Social History  Substance Use Topics  . Smoking status: Never Smoker  . Smokeless tobacco: Never Used  . Alcohol use 3.0 oz/week    5 Shots of liquor per week     Comment: ocassionally     Allergies   Patient has no known allergies.   Review of Systems Review of Systems  Constitutional: Positive for chills and fatigue. Negative for fever.  HENT: Negative for sore throat.   Eyes: Negative for visual disturbance.  Respiratory: Positive for shortness of breath. Negative for cough.   Cardiovascular: Positive for chest pain.  Negative for leg swelling.  Gastrointestinal: Positive for diarrhea, nausea and vomiting. Negative for abdominal pain.  Genitourinary: Negative for difficulty urinating and dysuria.  Musculoskeletal: Negative for back pain and neck pain.  Skin: Negative for rash.  Neurological: Negative for syncope and headaches.     Physical Exam Updated Vital Signs BP 123/84   Pulse (!) 55   Temp 98.3 F (36.8 C) (Oral)   Resp 18   Ht 5\' 7"  (1.702 m)   Wt 63.5 kg (140 lb)   LMP 02/01/2017   SpO2 100%   BMI 21.93 kg/m   Physical Exam  Constitutional: She is oriented to person, place, and time. She appears well-developed and well-nourished. No distress.  HENT:  Head: Normocephalic and atraumatic.    Eyes: Conjunctivae and EOM are normal.  Neck: Normal range of motion.  Cardiovascular: Normal rate, regular rhythm, normal heart sounds and intact distal pulses.  Exam reveals no gallop and no friction rub.   No murmur heard. Pulmonary/Chest: Effort normal and breath sounds normal. No respiratory distress. She has no wheezes. She has no rales.  Abdominal: Soft. She exhibits no distension. There is tenderness (mild diffuse). There is no guarding.  Musculoskeletal: She exhibits no edema or tenderness.  Neurological: She is alert and oriented to person, place, and time.  Skin: Skin is warm and dry. No rash noted. She is not diaphoretic. No erythema.  Nursing note and vitals reviewed.    ED Treatments / Results  Labs (all labs ordered are listed, but only abnormal results are displayed) Labs Reviewed  COMPREHENSIVE METABOLIC PANEL - Abnormal; Notable for the following:       Result Value   CO2 20 (*)    Glucose, Bld 163 (*)    All other components within normal limits  CBC - Abnormal; Notable for the following:    RBC 3.83 (*)    Hemoglobin 11.9 (*)    HCT 33.8 (*)    Platelets 138 (*)    All other components within normal limits  URINALYSIS, ROUTINE W REFLEX MICROSCOPIC - Abnormal; Notable for the following:    Glucose, UA 150 (*)    Hgb urine dipstick LARGE (*)    Ketones, ur 20 (*)    Squamous Epithelial / LPF 0-5 (*)    All other components within normal limits  LIPASE, BLOOD  I-STAT BETA HCG BLOOD, ED ([facility], [facility], [facility] ONLY)  I-STAT TROPOININ, ED    EKG  EKG Interpretation  Date/Time:  Sunday February 04 2017 09:12:23 EDT Ventricular Rate:  59 PR Interval:    QRS Duration: 87 QT Interval:  473 QTC Calculation: 469 R Axis:   83 Text Interpretation:  Sinus rhythm Atrial premature complex ST elev, probable normal early repol pattern No significant change since last tracing Confirmed by Alvira Monday ([id number]) on 02/04/2017 9:29:08 AM       Radiology Dg Chest 2  View  Result Date: 02/04/2017 CLINICAL DATA:  Mid chest pain and shortness of breath. EXAM: CHEST  2 VIEW COMPARISON:  CT scan dated 11/04/2016 FINDINGS: The heart size and mediastinal contours are within normal limits. Both lungs are clear. The visualized skeletal structures are unremarkable. IMPRESSION: Normal exam. Electronically Signed   By: Francene Boyers M.D.   On: 02/04/2017 09:43    Procedures Procedures (including critical care time)  Medications Ordered in ED Medications  ondansetron (ZOFRAN-ODT) disintegrating tablet 8 mg (8 mg Oral Given 02/04/17 0627)  sodium chloride 0.9 % bolus 1,000 mL (0 mLs  Intravenous Stopped 02/04/17 1100)  haloperidol lactate (HALDOL) injection 5 mg (5 mg Intravenous Given 02/04/17 0907)  ondansetron (ZOFRAN) injection 4 mg (4 mg Intravenous Given 02/04/17 1127)     Initial Impression / Assessment and Plan / ED Course  I have reviewed the triage vital signs and the nursing notes.  Pertinent labs & imaging results that were available during my care of the patient were reviewed by me and considered in my medical decision making (see chart for details).     21 year old female presents with concern for nausea, vomiting, diarrhea, chest pain and dyspnea beginning at 4 AM.  EKG obtained shows benign early repolarization, no acute abnormalities in comparison to prior. Chest x-ray shows no acute findings.  Troponin negative. PERC negative and doubt PE. Abdominal exam benign and nonfocal, and have low suspicion for cholecystitis, appendicitis, diverticulitis or obstruction.  Labs obtained show no sign of pancreatitis or hepatitis. Patient denies any alcohol or drug use with the exception of marijuana.  Suspect gastroenteritis with reflux, less likely cannabinoid hyperemesis. Gave patient Haldol for nausea and abdominal pain and provided IV fluids.  Patient with improvement, able to tolerate po. Patient discharged in stable condition with understanding of reasons to  return.   Final Clinical Impressions(s) / ED Diagnoses   Final diagnoses:  Nausea vomiting and diarrhea  Chest pain, unspecified type  Acute gastritis without hemorrhage, unspecified gastritis type    New Prescriptions Discharge Medication List as of 02/04/2017 12:48 PM    START taking these medications   Details  dicyclomine (BENTYL) 20 MG tablet Take 1 tablet (20 mg total) by mouth 2 (two) times daily as needed for spasms (abdominal pain)., Starting Sun 02/04/2017, Print    ondansetron (ZOFRAN ODT) 4 MG disintegrating tablet Take 1 tablet (4 mg total) by mouth every 8 (eight) hours as needed for nausea or vomiting., Starting Sun 02/04/2017, Print         Alvira Monday, MD 02/05/17 2106

## 2017-02-04 NOTE — ED Notes (Signed)
MD Schlossman updated about patient's concerns

## 2017-02-04 NOTE — ED Notes (Signed)
Family verbalized understanding that the MD would be in to reassess.

## 2017-02-04 NOTE — ED Notes (Signed)
Pt returned from Xray. Pt placed back on the monitor and educated further about getting out of bed. Pt asked not to get out of bed without a staff member. Both railings are up. All patient's belongings are with her. Call light within reach and pt educated. Pt requesting her grandmother and told that her grandmother is in the waiting room and would be updated.

## 2017-02-04 NOTE — ED Notes (Signed)
MD Schlossman updated about patient's progress

## 2017-02-04 NOTE — ED Notes (Signed)
Pt taken to Xray.

## 2017-02-04 NOTE — ED Notes (Signed)
Pt thrashing around in the bed. Pt stated, "I can't breathe. This is not right. I don't know what is wrong with me." Pt reassured that all her VS were stable and we were waiting on the Xray to result. Grandmother at the bedside and verbalizes understanding. Pt encouraged to rest

## 2017-02-04 NOTE — ED Notes (Signed)
Pt very anxious and out of control trying to get out of the wc, ED staff constantly orienting her to stay on wc and pt still moving all over the wc and trying to fall out of it, pt place on High fall risk precaution at this time.

## 2017-05-14 ENCOUNTER — Encounter (HOSPITAL_COMMUNITY): Payer: Self-pay | Admitting: *Deleted

## 2017-05-14 ENCOUNTER — Inpatient Hospital Stay (HOSPITAL_COMMUNITY)
Admission: AD | Admit: 2017-05-14 | Discharge: 2017-05-14 | Disposition: A | Discharge status: Home / Self Care | Payer: Self-pay | Source: Ambulatory Visit | Attending: Obstetrics & Gynecology | Admitting: Obstetrics & Gynecology

## 2017-05-14 DIAGNOSIS — Z79899 Other long term (current) drug therapy: Secondary | ICD-10-CM | POA: Insufficient documentation

## 2017-05-14 DIAGNOSIS — B373 Candidiasis of vulva and vagina: Secondary | ICD-10-CM | POA: Insufficient documentation

## 2017-05-14 DIAGNOSIS — B3731 Acute candidiasis of vulva and vagina: Secondary | ICD-10-CM

## 2017-05-14 DIAGNOSIS — Z3202 Encounter for pregnancy test, result negative: Secondary | ICD-10-CM

## 2017-05-14 DIAGNOSIS — Z113 Encounter for screening for infections with a predominantly sexual mode of transmission: Secondary | ICD-10-CM

## 2017-05-14 DIAGNOSIS — R102 Pelvic and perineal pain: Secondary | ICD-10-CM

## 2017-05-14 HISTORY — DX: Personal history of other infectious and parasitic diseases: Z86.19

## 2017-05-14 LAB — URINALYSIS, ROUTINE W REFLEX MICROSCOPIC
Bilirubin Urine: NEGATIVE
Glucose, UA: NEGATIVE mg/dL
Ketones, ur: NEGATIVE mg/dL
Nitrite: NEGATIVE
PROTEIN: NEGATIVE mg/dL
SPECIFIC GRAVITY, URINE: 1.016 (ref 1.005–1.030)
pH: 5 (ref 5.0–8.0)

## 2017-05-14 LAB — WET PREP, GENITAL
CLUE CELLS WET PREP: NONE SEEN
Sperm: NONE SEEN
Trich, Wet Prep: NONE SEEN
Yeast Wet Prep HPF POC: NONE SEEN

## 2017-05-14 LAB — POCT PREGNANCY, URINE: PREG TEST UR: NEGATIVE

## 2017-05-14 MED ORDER — NORGESTIMATE-ETH ESTRADIOL 0.25-35 MG-MCG PO TABS: 1.0000 | ORAL_TABLET | Freq: Every day | ORAL | 0 refills | Status: DC

## 2017-05-14 MED ORDER — FLUCONAZOLE 150 MG PO TABS
150.0000 mg | ORAL_TABLET | Freq: Once | ORAL | Status: AC
  Administered 2017-05-14: 150 mg via ORAL
  Filled 2017-05-14: qty 1

## 2017-05-14 MED ORDER — FLUCONAZOLE 150 MG PO TABS: 150.0000 mg | ORAL_TABLET | Freq: Every day | ORAL | 1 refills | Status: DC

## 2017-05-14 MED ORDER — TERCONAZOLE 0.8 % VA CREA: 1.0000 | TOPICAL_CREAM | Freq: Every day | VAGINAL | 0 refills | Status: DC

## 2017-05-14 NOTE — MAU Provider Note (Signed)
History     CSN: [id number]  Arrival date and time: 05/14/17 1020  First Provider Initiated Contact with Patient 05/14/17 1204      Chief Complaint  Patient presents with  . Vaginal Discharge  . Vaginal Pain   HPI Stacey Rodriguez is a 21 y.o. female who presents with vaginal discharge & pain. Symptoms began yesterday. Reports clumpy white vaginal discharge; no foul odor. Endorses vaginal burning & itching. Rates vaginal pain 8/10. Has not treated pain. Has hx of HSV but has not noticed lesions. Denies fever/chills, dyspareunia, postcoital bleeding, abdominal pain or dysuria. Denies changes to soap, or detergent, & does not use douches. Patient is sexually active with 2 partners & does not routinely use condoms. Desires STI testing. Hx of chlamydia, HSV, & trich. Uses OCPs for contraception.   Past Medical History:  Diagnosis Date  . Genital herpes   . Hx of chlamydia infection 2017  . Hx of trichomoniasis 2017    Past Surgical History:  Procedure Laterality Date  . NO PAST SURGERIES      Family History  Problem Relation Age of Onset  . Asthma Mother     Social History  Substance Use Topics  . Smoking status: Never Smoker  . Smokeless tobacco: Never Used  . Alcohol use 3.0 oz/week    5 Shots of liquor per week     Comment: ocassionally    Allergies: No Known Allergies  Prescriptions Prior to Admission  Medication Sig Dispense Refill Last Dose  . cyclobenzaprine (FLEXERIL) 10 MG tablet Take 1 tablet (10 mg total) by mouth 3 (three) times daily as needed for muscle spasms (or pain). (Patient not taking: Reported on 02/04/2017) 9 tablet 0 Not Taking at Unknown time  . dicyclomine (BENTYL) 20 MG tablet Take 1 tablet (20 mg total) by mouth 2 (two) times daily as needed for spasms (abdominal pain). 20 tablet 0   . ibuprofen (ADVIL,MOTRIN) 800 MG tablet Take 1 tablet (800 mg total) by mouth every 8 (eight) hours as needed for mild pain or moderate pain. (Patient not taking:  Reported on 02/04/2017) 15 tablet 0 Not Taking at Unknown time  . ondansetron (ZOFRAN ODT) 4 MG disintegrating tablet Take 1 tablet (4 mg total) by mouth every 8 (eight) hours as needed for nausea or vomiting. 15 tablet 0   . ranitidine (ZANTAC) 150 MG capsule Take 1 capsule (150 mg total) by mouth daily. 30 capsule 0   . valACYclovir (VALTREX) 500 MG tablet Take 1 tablet (500 mg total) by mouth 2 (two) times daily. For herpes infection (Patient not taking: Reported on 02/04/2017) 10 tablet 0 Not Taking at Unknown time    Review of Systems  Constitutional: Negative.   Gastrointestinal: Negative.   Genitourinary: Positive for vaginal discharge and vaginal pain. Negative for dyspareunia, genital sores, hematuria, menstrual problem, pelvic pain and vaginal bleeding.   Physical Exam   Blood pressure 116/71, pulse 81, temperature 98.2 F (36.8 C), temperature source Oral, resp. rate 16, weight 131 lb 8 oz (59.6 kg), last menstrual period 03/31/2017.  Physical Exam  Nursing note and vitals reviewed. Constitutional: She is oriented to person, place, and time. She appears well-developed and well-nourished. No distress.  HENT:  Head: Normocephalic and atraumatic.  Eyes: Conjunctivae are normal. Right eye exhibits no discharge. Left eye exhibits no discharge. No scleral icterus.  Neck: Normal range of motion.  Respiratory: Effort normal. No respiratory distress.  GI: Soft. She exhibits no distension. There is no tenderness.  Genitourinary: Uterus normal. There is tenderness on the right labia. There is no lesion on the right labia. There is tenderness on the left labia. There is no lesion on the left labia. Cervix exhibits discharge. Cervix exhibits no motion tenderness and no friability. Right adnexum displays no mass and no tenderness. Left adnexum displays no mass and no tenderness. There is erythema and tenderness in the vagina. Vaginal discharge found.  Genitourinary Comments: Moderate amount of  watery pink tinged discharge & small amount of clumpy discharge adherent to labia & vaginal walls.   Neurological: She is alert and oriented to person, place, and time.  Skin: Skin is warm and dry. She is not diaphoretic.  Psychiatric: She has a normal mood and affect. Her behavior is normal. Judgment and thought content normal.    MAU Course  Procedures Results for orders placed or performed during the hospital encounter of 05/14/17 (from the past 24 hour(s))  Urinalysis, Routine w reflex microscopic (not at [facility])     Status: Abnormal   Collection Time: 05/14/17 11:24 AM  Result Value Ref Range   Color, Urine YELLOW YELLOW   APPearance CLOUDY (A) CLEAR   Specific Gravity, Urine 1.016 1.005 - 1.030   pH 5.0 5.0 - 8.0   Glucose, UA NEGATIVE NEGATIVE mg/dL   Hgb urine dipstick MODERATE (A) NEGATIVE   Bilirubin Urine NEGATIVE NEGATIVE   Ketones, ur NEGATIVE NEGATIVE mg/dL   Protein, ur NEGATIVE NEGATIVE mg/dL   Nitrite NEGATIVE NEGATIVE   Leukocytes, UA LARGE (A) NEGATIVE   RBC / HPF 6-30 0 - 5 RBC/hpf   WBC, UA TOO NUMEROUS TO COUNT 0 - 5 WBC/hpf   Bacteria, UA RARE (A) NONE SEEN   Squamous Epithelial / LPF 6-30 (A) NONE SEEN  Pregnancy, urine POC     Status: None   Collection Time: 05/14/17 11:56 AM  Result Value Ref Range   Preg Test, Ur NEGATIVE NEGATIVE  Wet prep, genital     Status: Abnormal   Collection Time: 05/14/17 12:43 PM  Result Value Ref Range   Yeast Wet Prep HPF POC NONE SEEN NONE SEEN   Trich, Wet Prep NONE SEEN NONE SEEN   Clue Cells Wet Prep HPF POC NONE SEEN NONE SEEN   WBC, Wet Prep HPF POC MANY (A) NONE SEEN   Sperm NONE SEEN     MDM UPT negative GC/CT & wet prep collected. Pt declined labs.  Will tx for yeast based on exam  Assessment and Plan  A; 1. Vaginal yeast infection   2. Screen for STD (sexually transmitted disease)   3. Pregnancy examination or test, negative result    P: Discharge home Rx diflucan & terazol Discussed reasons to  return to MAU Keep follow up appointment with OB/PCP  GC/CT pending   Judeth Horn 05/14/2017, 12:04 PM

## 2017-05-14 NOTE — MAU Note (Signed)
Has a yeast infection she thinks, it 's burning and hurting.  Started 3 days ago. No period in Sept, neg preg test last wk

## 2017-05-14 NOTE — Discharge Instructions (Signed)

## 2017-05-15 LAB — GC/CHLAMYDIA PROBE AMP (~~LOC~~) NOT AT ARMC
Chlamydia: NEGATIVE
Neisseria Gonorrhea: NEGATIVE

## 2017-06-29 ENCOUNTER — Other Ambulatory Visit: Payer: Self-pay

## 2017-06-29 ENCOUNTER — Encounter (HOSPITAL_COMMUNITY): Payer: Self-pay | Admitting: Emergency Medicine

## 2017-06-29 ENCOUNTER — Ambulatory Visit (HOSPITAL_COMMUNITY)
Admission: EM | Admit: 2017-06-29 | Discharge: 2017-06-29 | Disposition: A | Discharge status: Home / Self Care | Payer: Self-pay | Attending: Internal Medicine | Admitting: Internal Medicine

## 2017-06-29 DIAGNOSIS — A6000 Herpesviral infection of urogenital system, unspecified: Secondary | ICD-10-CM
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
## 2017-07-03 ENCOUNTER — Emergency Department (HOSPITAL_COMMUNITY)
Admission: EM | Admit: 2017-07-03 | Discharge: 2017-07-03 | Disposition: A | Discharge status: Home / Self Care | Payer: Self-pay | Attending: Emergency Medicine | Admitting: Emergency Medicine

## 2017-07-03 DIAGNOSIS — Z5321 Procedure and treatment not carried out due to patient leaving prior to being seen by health care provider: Secondary | ICD-10-CM | POA: Insufficient documentation

## 2017-07-03 DIAGNOSIS — N898 Other specified noninflammatory disorders of vagina: Secondary | ICD-10-CM | POA: Insufficient documentation

## 2017-07-03 NOTE — ED Notes (Signed)
Once patient found out that currently over hour wait time. She decided she is leaving [facility].

## 2017-07-03 NOTE — ED Notes (Signed)
Patient c/o that her friend is here getting seen for herpes and he doesn't know that she has herpes, so she came to get pelvic check/STD. Patient is having vaginal discharge.

## 2017-08-14 ENCOUNTER — Encounter (HOSPITAL_COMMUNITY): Payer: Self-pay | Admitting: Emergency Medicine

## 2017-08-14 ENCOUNTER — Emergency Department (HOSPITAL_COMMUNITY)
Admission: EM | Admit: 2017-08-14 | Discharge: 2017-08-14 | Disposition: A | Discharge status: Home / Self Care | Payer: Self-pay | Attending: Emergency Medicine | Admitting: Emergency Medicine

## 2017-08-14 ENCOUNTER — Other Ambulatory Visit: Payer: Self-pay

## 2017-08-14 DIAGNOSIS — Y999 Unspecified external cause status: Secondary | ICD-10-CM | POA: Insufficient documentation

## 2017-08-14 DIAGNOSIS — S71112A Laceration without foreign body, left thigh, initial encounter: Secondary | ICD-10-CM | POA: Insufficient documentation

## 2017-08-14 DIAGNOSIS — Y929 Unspecified place or not applicable: Secondary | ICD-10-CM | POA: Insufficient documentation

## 2017-08-14 DIAGNOSIS — S81812A Laceration without foreign body, left lower leg, initial encounter: Secondary | ICD-10-CM

## 2017-08-14 DIAGNOSIS — Z23 Encounter for immunization: Secondary | ICD-10-CM | POA: Insufficient documentation

## 2017-08-14 DIAGNOSIS — Y939 Activity, unspecified: Secondary | ICD-10-CM | POA: Insufficient documentation

## 2017-08-14 DIAGNOSIS — W260XXA Contact with knife, initial encounter: Secondary | ICD-10-CM | POA: Insufficient documentation

## 2017-08-14 MED ORDER — LORAZEPAM 2 MG/ML IJ SOLN
1.0000 mg | Freq: Once | INTRAMUSCULAR | Status: AC
  Administered 2017-08-14: 1 mg via INTRAVENOUS
  Filled 2017-08-14: qty 1

## 2017-08-14 MED ORDER — SODIUM CHLORIDE 0.9 % IV BOLUS (SEPSIS)
1000.0000 mL | Freq: Once | INTRAVENOUS | Status: AC
  Administered 2017-08-14: 1000 mL via INTRAVENOUS

## 2017-08-14 MED ORDER — HYDROMORPHONE HCL 1 MG/ML IJ SOLN
0.5000 mg | Freq: Once | INTRAMUSCULAR | Status: AC
  Administered 2017-08-14: 0.5 mg via INTRAVENOUS
  Filled 2017-08-14: qty 1

## 2017-08-14 MED ORDER — CEFAZOLIN SODIUM-DEXTROSE 2-4 GM/100ML-% IV SOLN
2.0000 g | Freq: Once | INTRAVENOUS | Status: AC
  Administered 2017-08-14: 2 g via INTRAVENOUS
  Filled 2017-08-14: qty 100

## 2017-08-14 MED ORDER — LIDOCAINE-EPINEPHRINE 1 %-1:100000 IJ SOLN
10.0000 mL | Freq: Once | INTRAMUSCULAR | Status: DC
  Filled 2017-08-14: qty 10

## 2017-08-14 MED ORDER — TRAMADOL HCL 50 MG PO TABS: 50.0000 mg | ORAL_TABLET | Freq: Four times a day (QID) | ORAL | 0 refills | Status: DC | PRN

## 2017-08-14 MED ORDER — TETANUS-DIPHTH-ACELL PERTUSSIS 5-2.5-18.5 LF-MCG/0.5 IM SUSP
0.5000 mL | Freq: Once | INTRAMUSCULAR | Status: AC
  Administered 2017-08-14: 0.5 mL via INTRAMUSCULAR
  Filled 2017-08-14: qty 0.5
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
## 2017-08-14 NOTE — Discharge Instructions (Signed)
Suture removal in 10-14 days.

## 2017-08-15 ENCOUNTER — Encounter (HOSPITAL_COMMUNITY): Payer: Self-pay | Admitting: Emergency Medicine

## 2017-08-24 ENCOUNTER — Encounter (HOSPITAL_COMMUNITY): Payer: Self-pay | Admitting: Emergency Medicine

## 2017-08-24 ENCOUNTER — Other Ambulatory Visit: Payer: Self-pay

## 2017-08-24 ENCOUNTER — Ambulatory Visit (HOSPITAL_COMMUNITY)
Admission: EM | Admit: 2017-08-24 | Discharge: 2017-08-24 | Disposition: A | Discharge status: Home / Self Care | Payer: Self-pay | Attending: Internal Medicine | Admitting: Internal Medicine

## 2017-08-24 DIAGNOSIS — S71112D Laceration without foreign body, left thigh, subsequent encounter: Secondary | ICD-10-CM

## 2017-08-24 MED ORDER — NAPROXEN 500 MG PO TABS: 500.0000 mg | ORAL_TABLET | Freq: Two times a day (BID) | ORAL | 0 refills | Status: DC

## 2017-08-24 MED ORDER — CEPHALEXIN 500 MG PO CAPS: 500.0000 mg | ORAL_CAPSULE | Freq: Four times a day (QID) | ORAL | 0 refills | Status: AC

## 2017-08-24 NOTE — ED Notes (Signed)
Wound on L left leg dressed with non stick dressing.
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
## 2017-08-24 NOTE — Discharge Instructions (Signed)
Keep clean and covered. May wash with soap and water daily. Continue to monitor for increased redness, drainage and pain. Ice pack may be helpful for pain. Light range of motion to knee.  Return on Sunday for recheck and possible suture removal

## 2017-08-24 NOTE — ED Triage Notes (Signed)
Pt had stitches placed in her left lateral thight 10 days ago.  Pt states she has had bleeding and yellow discharge from the wound and she states she is now having pain in her knee.  The sutures were to be removed today.  Pt was given tramadol for pain.

## 2017-09-12 ENCOUNTER — Encounter (HOSPITAL_COMMUNITY): Payer: Self-pay | Admitting: Emergency Medicine

## 2017-09-12 ENCOUNTER — Ambulatory Visit (HOSPITAL_COMMUNITY)
Admission: EM | Admit: 2017-09-12 | Discharge: 2017-09-12 | Disposition: A | Discharge status: Home / Self Care | Payer: Self-pay | Attending: Family Medicine | Admitting: Family Medicine

## 2017-09-12 DIAGNOSIS — S71102D Unspecified open wound, left thigh, subsequent encounter: Secondary | ICD-10-CM

## 2017-09-12 DIAGNOSIS — Z4802 Encounter for removal of sutures: Secondary | ICD-10-CM

## 2017-09-12 NOTE — ED Triage Notes (Signed)
Here to have [facility] removed [facility]14/[facility]  Sts she was supposed to have them removed 2 weeks ago but was not able to get in until today.   Voices no new concerns
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
## 2017-09-21 ENCOUNTER — Encounter (HOSPITAL_COMMUNITY): Payer: Self-pay | Admitting: Family Medicine

## 2017-09-21 ENCOUNTER — Ambulatory Visit (HOSPITAL_COMMUNITY)
Admission: EM | Admit: 2017-09-21 | Discharge: 2017-09-21 | Disposition: A | Discharge status: Home / Self Care | Payer: Medicaid Other | Attending: Internal Medicine | Admitting: Internal Medicine

## 2017-09-21 DIAGNOSIS — Z5189 Encounter for other specified aftercare: Secondary | ICD-10-CM

## 2017-09-21 DIAGNOSIS — S71112D Laceration without foreign body, left thigh, subsequent encounter: Secondary | ICD-10-CM

## 2017-09-21 DIAGNOSIS — L089 Local infection of the skin and subcutaneous tissue, unspecified: Secondary | ICD-10-CM

## 2017-09-21 MED ORDER — SULFAMETHOXAZOLE-TRIMETHOPRIM 800-160 MG PO TABS: 1.0000 | ORAL_TABLET | Freq: Two times a day (BID) | ORAL | 0 refills | Status: AC
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
## 2017-09-21 NOTE — ED Triage Notes (Addendum)
Pt here for re check of wound to left upper thigh. She thinks it may be infected. Taking abx. sts that she has stiches removed last week and then the wound formed a blister and then reopened. Denies any pain.

## 2017-10-01 ENCOUNTER — Encounter (HOSPITAL_COMMUNITY): Payer: Self-pay | Admitting: Emergency Medicine

## 2017-10-01 ENCOUNTER — Emergency Department (HOSPITAL_COMMUNITY)
Admission: EM | Admit: 2017-10-01 | Discharge: 2017-10-01 | Disposition: A | Discharge status: Home / Self Care | Payer: BLUE CROSS/BLUE SHIELD | Source: Home / Self Care

## 2017-10-01 ENCOUNTER — Emergency Department (HOSPITAL_COMMUNITY)
Admission: EM | Admit: 2017-10-01 | Discharge: 2017-10-01 | Disposition: A | Discharge status: Home / Self Care | Payer: BLUE CROSS/BLUE SHIELD | Attending: Emergency Medicine | Admitting: Emergency Medicine

## 2017-10-01 ENCOUNTER — Other Ambulatory Visit: Payer: Self-pay

## 2017-10-01 DIAGNOSIS — Z5321 Procedure and treatment not carried out due to patient leaving prior to being seen by health care provider: Secondary | ICD-10-CM

## 2017-10-01 DIAGNOSIS — F17210 Nicotine dependence, cigarettes, uncomplicated: Secondary | ICD-10-CM | POA: Insufficient documentation

## 2017-10-01 DIAGNOSIS — F1123 Opioid dependence with withdrawal: Secondary | ICD-10-CM

## 2017-10-01 DIAGNOSIS — F1193 Opioid use, unspecified with withdrawal: Secondary | ICD-10-CM

## 2017-10-01 LAB — RAPID URINE DRUG SCREEN, HOSP PERFORMED
Amphetamines: NOT DETECTED
Barbiturates: NOT DETECTED
Benzodiazepines: NOT DETECTED
Cocaine: NOT DETECTED
Opiates: NOT DETECTED
Tetrahydrocannabinol: POSITIVE — AB

## 2017-10-01 LAB — COMPREHENSIVE METABOLIC PANEL
ALBUMIN: 4.5 g/dL (ref 3.5–5.0)
ALK PHOS: 50 U/L (ref 38–126)
ALT: 17 U/L (ref 14–54)
AST: 31 U/L (ref 15–41)
Anion gap: 14 (ref 5–15)
BILIRUBIN TOTAL: 0.9 mg/dL (ref 0.3–1.2)
BUN: 12 mg/dL (ref 6–20)
CO2: 20 mmol/L — AB (ref 22–32)
Calcium: 9.6 mg/dL (ref 8.9–10.3)
Chloride: 105 mmol/L (ref 101–111)
Creatinine, Ser: 0.78 mg/dL (ref 0.44–1.00)
GFR calc Af Amer: >60 mL/min (ref >60)
GFR calc non Af Amer: >60 mL/min (ref >60)
GLUCOSE: 172 mg/dL — AB (ref 65–99)
Potassium: 3.7 mmol/L (ref 3.5–5.1)
SODIUM: 139 mmol/L (ref 135–145)
TOTAL PROTEIN: 7.3 g/dL (ref 6.5–8.1)

## 2017-10-01 LAB — LIPASE, BLOOD: Lipase: 26 U/L (ref 11–51)

## 2017-10-01 LAB — CBC
HEMATOCRIT: 28.9 % — AB (ref 36.0–46.0)
Hemoglobin: 9.5 g/dL — ABNORMAL LOW (ref 12.0–15.0)
MCH: 27.5 pg (ref 26.0–34.0)
MCHC: 32.9 g/dL (ref 30.0–36.0)
MCV: 83.8 fL (ref 78.0–100.0)
Platelets: 192 10*3/uL (ref 150–400)
RBC: 3.45 MIL/uL — ABNORMAL LOW (ref 3.87–5.11)
RDW: 14.4 % (ref 11.5–15.5)
WBC: 7.7 10*3/uL (ref 4.0–10.5)

## 2017-10-01 LAB — URINALYSIS, ROUTINE W REFLEX MICROSCOPIC
Bilirubin Urine: NEGATIVE
Glucose, UA: 50 mg/dL — AB
HGB URINE DIPSTICK: NEGATIVE
Ketones, ur: 80 mg/dL — AB
Leukocytes, UA: NEGATIVE
NITRITE: NEGATIVE
PROTEIN: 30 mg/dL — AB
Specific Gravity, Urine: 1.024 (ref 1.005–1.030)
pH: 8 (ref 5.0–8.0)

## 2017-10-01 LAB — ETHANOL

## 2017-10-01 LAB — I-STAT BETA HCG BLOOD, ED (MC, WL, AP ONLY)

## 2017-10-01 MED ORDER — AMMONIA AROMATIC IN INHA
RESPIRATORY_TRACT | Status: AC
  Filled 2017-10-01: qty 20

## 2017-10-01 MED ORDER — ONDANSETRON HCL 4 MG/2ML IJ SOLN: 4.0000 mg | Freq: Once | INTRAMUSCULAR | Status: DC

## 2017-10-01 MED ORDER — ONDANSETRON 4 MG PO TBDP: ORAL_TABLET | ORAL | 0 refills | Status: DC

## 2017-10-01 MED ORDER — ONDANSETRON 4 MG PO TBDP
4.0000 mg | ORAL_TABLET | Freq: Once | ORAL | Status: AC
  Administered 2017-10-01: 4 mg via ORAL
  Filled 2017-10-01: qty 1

## 2017-10-01 MED ORDER — ONDANSETRON HCL 4 MG/2ML IJ SOLN
4.0000 mg | Freq: Once | INTRAMUSCULAR | Status: AC
  Administered 2017-10-01: 4 mg via INTRAVENOUS
  Filled 2017-10-01: qty 2

## 2017-10-01 MED ORDER — SODIUM CHLORIDE 0.9 % IV BOLUS (SEPSIS)
1000.0000 mL | Freq: Once | INTRAVENOUS | Status: AC
  Administered 2017-10-01: 1000 mL via INTRAVENOUS

## 2017-10-01 NOTE — ED Triage Notes (Signed)
Patient adds that she has abd pains and vomiting "green stuff".

## 2017-10-01 NOTE — ED Triage Notes (Signed)
Pt states she is withdrawing from Percocet.  Normally takes 2 per day to "get high".  Last Percocet 2 days ago.  C/o vomiting x 10 since 5pm, diarrhea x 2, and abd cramping.

## 2017-10-01 NOTE — ED Triage Notes (Signed)
[facility] patient from home for anxiety, SOB, and percocet withdrawal. patient yelling at phone stating that she is "about to take my last breath". [facility] before being seen.

## 2017-10-01 NOTE — ED Notes (Signed)
No answer when called for reassessment  

## 2017-10-01 NOTE — Discharge Instructions (Signed)
Zofran for nausea, imodium for diarrhea, tylenol and motrin for aches and pains.

## 2017-10-01 NOTE — ED Notes (Signed)
No answer for recheck vitals 

## 2017-10-01 NOTE — ED Provider Notes (Signed)
[facility]-EMERGENCY DEPT Provider Note   CSN: [id number] Arrival date & time: 10/01/17  8295     History   Chief Complaint Chief Complaint  Patient presents with  . Shortness of Breath  . Anxiety  . withdrawl from percocet  . Abdominal Pain  . Emesis    HPI Stacey Rodriguez is a 22 y.o. female.  22 yo F with a chief complaint of Percocet withdrawal.  Patient says that she has been taking Percocets for some time.  She stopped taking them to try and get off of it about 2 days ago.  Since then has had some anxiety some generalized abdominal pain some nausea and vomiting.  Has been able to transiently tolerate p.o. without difficulty.  Went to the [facility] emergency department this morning and felt that the wait was too long and so left without being seen.  Started having a recurrence of her vomiting about 5 hours ago and so called EMS.   The history is provided by the patient.  Shortness of Breath  Associated symptoms include vomiting and abdominal pain. Pertinent negatives include no fever, no headaches, no rhinorrhea, no wheezing and no chest pain.  Anxiety  Associated symptoms include abdominal pain. Pertinent negatives include no chest pain, no headaches and no shortness of breath.  Abdominal Pain   Associated symptoms include nausea and vomiting. Pertinent negatives include fever, dysuria, headaches, arthralgias and myalgias.  Emesis   Associated symptoms include abdominal pain. Pertinent negatives include no arthralgias, no chills, no fever, no headaches and no myalgias.  Illness  This is a new problem. The current episode started less than 1 hour ago. The problem occurs constantly. The problem has not changed since onset.Associated symptoms include abdominal pain. Pertinent negatives include no chest pain, no headaches and no shortness of breath. Nothing aggravates the symptoms. Nothing relieves the symptoms. She has tried nothing for the symptoms.  The treatment provided no relief.    Past Medical History:  Diagnosis Date  . Genital herpes   . Hx of chlamydia infection 2017  . Hx of trichomoniasis 2017    Patient Active Problem List   Diagnosis Date Noted  . Substance abuse (HCC) 11/11/2015  . Adjustment disorder with mixed anxiety and depressed mood 11/11/2015  . Vaginal discharge 11/12/2012  . ECZEMA 05/27/2008  . ACNE 05/27/2008    Past Surgical History:  Procedure Laterality Date  . NO PAST SURGERIES      OB History    No data available       Home Medications    Prior to Admission medications   Medication Sig Start Date End Date Taking? Authorizing Provider  naproxen (NAPROSYN) 500 MG tablet Take 1 tablet (500 mg total) by mouth 2 (two) times daily. 08/24/17  Yes Burky, Dorene Grebe B, NP  oxyCODONE-acetaminophen (PERCOCET) 10-325 MG tablet Take 1 tablet by mouth daily.   Yes [provider]  traMADol (ULTRAM) 50 MG tablet Take 1 tablet (50 mg total) by mouth every 6 (six) hours as needed. 08/14/17  Yes Raeford Razor, MD  norgestimate-ethinyl estradiol (ORTHO-CYCLEN,SPRINTEC,PREVIFEM) 0.25-35 MG-MCG tablet Take 1 tablet by mouth daily. Patient not taking: Reported on 10/01/2017 05/14/17   Judeth Horn, NP  ondansetron [facility] ODT) 4 MG disintegrating tablet 4mg  ODT q4 hours prn nausea/vomit 10/01/17   Melene Plan, DO  ranitidine (ZANTAC) 150 MG capsule Take 1 capsule (150 mg total) by mouth daily. Patient not taking: Reported on 10/01/2017 02/04/17   Alvira Monday, MD  sulfamethoxazole-trimethoprim (  BACTRIM DS,SEPTRA DS) 800-160 MG tablet Take 1 tablet by mouth 2 (two) times daily for 10 days. Patient not taking: Reported on 10/01/2017 09/21/17 10/01/17  Alysia PennaBlue, Olivia C, PA-C  terconazole (TERAZOL 3) 0.8 % vaginal cream Place 1 applicator vaginally at bedtime. Patient not taking: Reported on 10/01/2017 05/14/17   Judeth HornLawrence, Erin, NP    Family History Family History  Problem Relation Age of Onset  . Asthma Mother      Social History Social History   Tobacco Use  . Smoking status: Current Every Day Smoker  . Smokeless tobacco: Never Used  Substance Use Topics  . Alcohol use: Yes    Comment: ocassionally  . Drug use: Yes    Types: Marijuana    Comment: Percocet addiction     Allergies   Patient has no known allergies.   Review of Systems Review of Systems  Constitutional: Negative for chills and fever.  HENT: Negative for congestion and rhinorrhea.   Eyes: Negative for redness and visual disturbance.  Respiratory: Negative for shortness of breath and wheezing.   Cardiovascular: Negative for chest pain and palpitations.  Gastrointestinal: Positive for abdominal pain, nausea and vomiting.  Genitourinary: Negative for dysuria and urgency.  Musculoskeletal: Negative for arthralgias and myalgias.  Skin: Negative for pallor and wound.  Neurological: Negative for dizziness and headaches.  Psychiatric/Behavioral: Positive for agitation. The patient is nervous/anxious.      Physical Exam Updated Vital Signs BP 121/83 (BP Location: Left Arm)   Pulse 72   Temp 99.2 F (37.3 C)   Resp 18   LMP 09/27/2017   SpO2 100%   Physical Exam  Constitutional: She is oriented to person, place, and time. She appears well-developed and well-nourished. No distress.  HENT:  Head: Normocephalic and atraumatic.  Eyes: EOM are normal. Pupils are equal, round, and reactive to light.  Neck: Normal range of motion. Neck supple.  Cardiovascular: Normal rate and regular rhythm. Exam reveals no gallop and no friction rub.  No murmur heard. Pulmonary/Chest: Effort normal. She has no wheezes. She has no rales.  Abdominal: Soft. She exhibits no distension. There is no tenderness. There is no rebound and no guarding.  Musculoskeletal: She exhibits no edema or tenderness.  Neurological: She is alert and oriented to person, place, and time.  Skin: Skin is warm and dry. She is not diaphoretic.  Psychiatric: She  has a normal mood and affect. Her behavior is normal.  Nursing note and vitals reviewed.    ED Treatments / Results  Labs (all labs ordered are listed, but only abnormal results are displayed) Labs Reviewed - No data to display  EKG  EKG Interpretation None       Radiology No results found.  Procedures Procedures (including critical care time)  Medications Ordered in ED Medications  ondansetron (ZOFRAN) injection 4 mg (0 mg Intravenous Hold 10/01/17 1130)  ammonia inhalant (not administered)  ondansetron (ZOFRAN-ODT) disintegrating tablet 4 mg (4 mg Oral Given 10/01/17 0845)  sodium chloride 0.9 % bolus 1,000 mL (0 mLs Intravenous Stopped 10/01/17 1245)  ondansetron (ZOFRAN) injection 4 mg (4 mg Intravenous Given 10/01/17 1130)     Initial Impression / Assessment and Plan / ED Course  I have reviewed the triage vital signs and the nursing notes.  Pertinent labs & imaging results that were available during my care of the patient were reviewed by me and considered in my medical decision making (see chart for details).     22 yo F with  a chief complaint of opiate withdrawal.  Patient is well-appearing and nontoxic.  She has normal vital signs.  There is no piloerection.  No abdominal tenderness on exam.  The family is somewhat persistent that she obtain some intravenous fluids.  Will give fluids, po trial.   Tolerating PO.  D/c home.   3:25 PM:  I have discussed the diagnosis/risks/treatment options with the patient and family and believe the pt to be eligible for discharge home to follow-up with PCP, outpatient substance abuse. We also discussed returning to the ED immediately if new or worsening sx occur. We discussed the sx which are most concerning (e.g., sudden worsening pain, fever, inability to tolerate by mouth) that necessitate immediate return. Medications administered to the patient during their visit and any new prescriptions provided to the patient are listed  below.  Medications given during this visit Medications  ondansetron (ZOFRAN) injection 4 mg (0 mg Intravenous Hold 10/01/17 1130)  ammonia inhalant (not administered)  ondansetron (ZOFRAN-ODT) disintegrating tablet 4 mg (4 mg Oral Given 10/01/17 0845)  sodium chloride 0.9 % bolus 1,000 mL (0 mLs Intravenous Stopped 10/01/17 1245)  ondansetron (ZOFRAN) injection 4 mg (4 mg Intravenous Given 10/01/17 1130)     The patient appears reasonably screen and/or stabilized for discharge and I doubt any other medical condition or other [facility] requiring further screening, evaluation, or treatment in the ED at this time prior to discharge.    Final Clinical Impressions(s) / ED Diagnoses   Final diagnoses:  Opiate withdrawal [facility])    ED Discharge Orders        Ordered    ondansetron (ZOFRAN ODT) 4 MG disintegrating tablet     10/01/17 1206       Melene Plan, DO 10/01/17 1525

## 2017-11-26 ENCOUNTER — Encounter (HOSPITAL_COMMUNITY): Payer: Self-pay | Admitting: Emergency Medicine

## 2017-11-26 ENCOUNTER — Ambulatory Visit (HOSPITAL_COMMUNITY)
Admission: EM | Admit: 2017-11-26 | Discharge: 2017-11-26 | Disposition: A | Discharge status: Home / Self Care | Payer: Medicaid Other | Attending: Internal Medicine | Admitting: Internal Medicine

## 2017-11-26 DIAGNOSIS — Z3202 Encounter for pregnancy test, result negative: Secondary | ICD-10-CM

## 2017-11-26 DIAGNOSIS — N898 Other specified noninflammatory disorders of vagina: Secondary | ICD-10-CM

## 2017-11-26 DIAGNOSIS — Z8619 Personal history of other infectious and parasitic diseases: Secondary | ICD-10-CM

## 2017-11-26 LAB — POCT URINALYSIS DIP (DEVICE)
Bilirubin Urine: NEGATIVE
GLUCOSE, UA: NEGATIVE mg/dL
Hgb urine dipstick: NEGATIVE
KETONES UR: NEGATIVE mg/dL
Nitrite: NEGATIVE
PROTEIN: NEGATIVE mg/dL
Specific Gravity, Urine: 1.02 (ref 1.005–1.030)
UROBILINOGEN UA: 0.2 mg/dL (ref 0.0–1.0)
pH: 6 (ref 5.0–8.0)

## 2017-11-26 LAB — POCT PREGNANCY, URINE: Preg Test, Ur: NEGATIVE

## 2017-11-26 MED ORDER — METRONIDAZOLE 500 MG PO TABS: 500.0000 mg | ORAL_TABLET | Freq: Two times a day (BID) | ORAL | 0 refills | Status: DC

## 2017-11-26 NOTE — ED Triage Notes (Signed)
Pt here for vaginal discharge; pt sts hx of BV and feels same

## 2017-11-26 NOTE — ED Provider Notes (Signed)
  MRN: [id number] DOB: 12/03/1995  Subjective:   Stacey Rodriguez is a 22 y.o. female presenting for persistent vaginal discharge.  Patient states that [facility] and diagnosed with both BV and chlamydia.  Patient received treatment for her chlamydia and she completed this.  She also got a prescription for Flagyl and admits that she did not finish his treatment at all.  She reports that she got tested for chlamydia again and was negative at follow-up.  However she continues to have vaginal discharge.  Denies fever, dysuria, hematuria, genital rash, vaginal pain, pelvic pain, nausea, vomiting.  She is not currently taking any medications and has No Known Allergies.  Past Medical History:  Diagnosis Date  . Genital herpes   . Hx of chlamydia infection 2017  . Hx of trichomoniasis 2017     Past Surgical History:  Procedure Laterality Date  . NO PAST SURGERIES      Objective:   Vitals: BP 107/72 (BP Location: Right Arm)   Pulse 69   Temp 98.5 F (36.9 C) (Oral)   Resp 18   SpO2 99%   Physical Exam  Constitutional: She is oriented to person, place, and time. She appears well-developed and well-nourished.  Cardiovascular: Normal rate.  Pulmonary/Chest: Effort normal.  Neurological: She is alert and oriented to person, place, and time.    Results for orders placed or performed during the hospital encounter of 11/26/17 (from the past 24 hour(s))  POCT urinalysis dip (device)     Status: Abnormal   Collection Time: 11/26/17 11:29 AM  Result Value Ref Range   Glucose, UA NEGATIVE NEGATIVE mg/dL   Bilirubin Urine NEGATIVE NEGATIVE   Ketones, ur NEGATIVE NEGATIVE mg/dL   Specific Gravity, Urine 1.020 1.005 - 1.030   Hgb urine dipstick NEGATIVE NEGATIVE   pH 6.0 5.0 - 8.0   Protein, ur NEGATIVE NEGATIVE mg/dL   Urobilinogen, UA 0.2 0.0 - 1.0 mg/dL   Nitrite NEGATIVE NEGATIVE   Leukocytes, UA TRACE (A) NEGATIVE  Pregnancy, urine POC     Status: None   Collection Time: 11/26/17 11:37 AM  Result Value Ref Range   Preg Test, Ur NEGATIVE NEGATIVE    Assessment and Plan :   Vaginal discharge  History of chlamydia  Counseled patient safe sex practices.  Will have patient restart Flagyl, patient declined STI testing.  Strongly recommended patient finish course of Flagyl this time.  Return to clinic if symptoms persist and will do repeat STI testing at that point.   Wallis BambergMani, Donta Fuster, PA-C 11/26/17 1214

## 2017-12-10 ENCOUNTER — Ambulatory Visit (HOSPITAL_COMMUNITY)
Admission: EM | Admit: 2017-12-10 | Discharge: 2017-12-10 | Disposition: A | Discharge status: Home / Self Care | Payer: Medicaid Other

## 2017-12-13 ENCOUNTER — Ambulatory Visit (HOSPITAL_COMMUNITY)
Admission: EM | Admit: 2017-12-13 | Discharge: 2017-12-13 | Disposition: A | Discharge status: Home / Self Care | Payer: Medicaid Other | Attending: Family Medicine | Admitting: Family Medicine

## 2017-12-13 ENCOUNTER — Encounter (HOSPITAL_COMMUNITY): Payer: Self-pay | Admitting: Emergency Medicine

## 2017-12-13 ENCOUNTER — Other Ambulatory Visit: Payer: Self-pay

## 2017-12-13 DIAGNOSIS — Z113 Encounter for screening for infections with a predominantly sexual mode of transmission: Secondary | ICD-10-CM

## 2017-12-13 DIAGNOSIS — Z3202 Encounter for pregnancy test, result negative: Secondary | ICD-10-CM | POA: Diagnosis not present

## 2017-12-13 DIAGNOSIS — Z202 Contact with and (suspected) exposure to infections with a predominantly sexual mode of transmission: Secondary | ICD-10-CM | POA: Diagnosis not present

## 2017-12-13 DIAGNOSIS — N390 Urinary tract infection, site not specified: Secondary | ICD-10-CM | POA: Insufficient documentation

## 2017-12-13 DIAGNOSIS — N898 Other specified noninflammatory disorders of vagina: Secondary | ICD-10-CM | POA: Insufficient documentation

## 2017-12-13 DIAGNOSIS — J4521 Mild intermittent asthma with (acute) exacerbation: Secondary | ICD-10-CM | POA: Insufficient documentation

## 2017-12-13 LAB — POCT PREGNANCY, URINE: Preg Test, Ur: NEGATIVE

## 2017-12-13 LAB — POCT URINALYSIS DIP (DEVICE)
BILIRUBIN URINE: NEGATIVE
Glucose, UA: NEGATIVE mg/dL
Ketones, ur: NEGATIVE mg/dL
Nitrite: NEGATIVE
PH: 7 (ref 5.0–8.0)
Protein, ur: NEGATIVE mg/dL
SPECIFIC GRAVITY, URINE: 1.02 (ref 1.005–1.030)
Urobilinogen, UA: 0.2 mg/dL (ref 0.0–1.0)

## 2017-12-13 MED ORDER — SULFAMETHOXAZOLE-TRIMETHOPRIM 800-160 MG PO TABS: 1.0000 | ORAL_TABLET | Freq: Two times a day (BID) | ORAL | 0 refills | Status: AC

## 2017-12-13 MED ORDER — AZITHROMYCIN 250 MG PO TABS
1000.0000 mg | ORAL_TABLET | Freq: Once | ORAL | Status: AC
Start: 2017-12-13 — End: 2017-12-13
  Administered 2017-12-13: 1000 mg via ORAL

## 2017-12-13 MED ORDER — AZITHROMYCIN 250 MG PO TABS
ORAL_TABLET | ORAL | Status: AC
  Filled 2017-12-13: qty 4

## 2017-12-13 MED ORDER — ALBUTEROL SULFATE HFA 108 (90 BASE) MCG/ACT IN AERS: 1.0000 | INHALATION_SPRAY | Freq: Four times a day (QID) | RESPIRATORY_TRACT | 11 refills | Status: DC | PRN

## 2017-12-13 NOTE — Discharge Instructions (Addendum)
You have asthma, a urinary tract infection, and exposure to chlamydia.  We are giving you an antibiotic to treat the possible chlamydia, an inhaler to help with the asthma, and an antibiotic to treat the urinary tract infection.  You need to quit smoking.   We are enclosing information to explain your problems and need to quit smoking.

## 2017-12-13 NOTE — ED Provider Notes (Signed)
[facility] CARE [facility]ENTER   [id number] 12/13/17 Arrival Time: 1122   SUBJECTIVE:  Stacey Rodriguez is a 22 y.o. female who presents to the urgent care with complaint of vaginal irritation and burning with urination x4 days ago.    Pt states her partner tested positive for chlamydia two days ago.    Pt also reports a cough x 2 weeks.  She has been wheezing and reports a positive F/H of asthma.  She smokes.     Past Medical History:  Diagnosis Date  . Genital herpes   . Hx of chlamydia infection 2017  . Hx of trichomoniasis 2017   Family History  Problem Relation Age of Onset  . Asthma Mother    Social History   Socioeconomic History  . Marital status: Single    Spouse name: Not on file  . Number of children: Not on file  . Years of education: Not on file  . Highest education level: Not on file  Occupational History  . Not on file  Social Needs  . Financial resource strain: Not on file  . Food insecurity:    Worry: Not on file    Inability: Not on file  . Transportation needs:    Medical: Not on file    Non-medical: Not on file  Tobacco Use  . Smoking status: Current Every Day Smoker  . Smokeless tobacco: Never Used  Substance and Sexual Activity  . Alcohol use: Yes    Comment: ocassionally  . Drug use: Yes    Types: Marijuana    Comment: Percocet addiction  . Sexual activity: Yes    Birth control/protection: None  Lifestyle  . Physical activity:    Days per week: Not on file    Minutes per session: Not on file  . Stress: Not on file  Relationships  . Social connections:    Talks on phone: Not on file    Gets together: Not on file    Attends religious service: Not on file    Active member of club or organization: Not on file    Attends meetings of clubs or organizations: Not on file    Relationship status: Not on file  . Intimate partner violence:    Fear of current or ex partner: Not on file    Emotionally abused: Not on file    Physically abused:  Not on file    Forced sexual activity: Not on file  Other Topics Concern  . Not on file  Social History Narrative   ** Merged History Encounter **       No outpatient medications have been marked as taking for the 12/13/17 encounter [facility] Encounter).   No Known Allergies    ROS: As per HPI, remainder of ROS negative.   OBJECTIVE:   Vitals:   12/13/17 1139  BP: 109/64  Pulse: 71  Temp: 98.4 F (36.9 C)  TempSrc: Oral  SpO2: 98%     General appearance: alert; no distress Eyes: PERRL; EOMI; conjunctiva normal HENT: normocephalic; atraumatic;  oral mucosa normal Neck: supple Lungs: wheezes on auscultation bilaterally Heart: regular rate and rhythm Abdomen: soft, non-tender; bowel sounds normal; no masses or organomegaly; no guarding or rebound tenderness Back: no CVA tenderness Extremities: no cyanosis or edema; symmetrical with no gross deformities Skin: warm and dry Neurologic: normal gait; grossly normal Psychological: alert and cooperative; normal mood and affect      Labs:  Results for orders placed or performed during the hospital encounter of 12/13/17  POCT urinalysis dip (device)  Result Value Ref Range   Glucose, UA NEGATIVE NEGATIVE mg/dL   Bilirubin Urine NEGATIVE NEGATIVE   Ketones, ur NEGATIVE NEGATIVE mg/dL   Specific Gravity, Urine 1.020 1.005 - 1.030   Hgb urine dipstick TRACE (A) NEGATIVE   pH 7.0 5.0 - 8.0   Protein, ur NEGATIVE NEGATIVE mg/dL   Urobilinogen, UA 0.2 0.0 - 1.0 mg/dL   Nitrite NEGATIVE NEGATIVE   Leukocytes, UA LARGE (A) NEGATIVE  Pregnancy, urine POC  Result Value Ref Range   Preg Test, Ur NEGATIVE NEGATIVE    Labs Reviewed  POCT URINALYSIS DIP (DEVICE) - Abnormal; Notable for the following components:      Result Value   Hgb urine dipstick TRACE (*)    Leukocytes, UA LARGE (*)    All other components within normal limits  URINE CULTURE  POCT PREGNANCY, URINE  URINE CYTOLOGY ANCILLARY ONLY    No results  found.     ASSESSMENT & PLAN:  1. Exposure to STD   2. Mild intermittent asthma with acute exacerbation   You have asthma, a urinary tract infection, and exposure to chlamydia.  We are giving you an antibiotic to treat the possible chlamydia, an inhaler to help with the asthma, and an antibiotic to treat the urinary tract infection.  You need to quit smoking.   We are enclosing information to explain your problems and need to quit smoking.  Advised to give up smoking.  Safe sex info provided. Meds ordered this encounter  Medications  . azithromycin (ZITHROMAX) tablet 1,000 mg  . sulfamethoxazole-trimethoprim (BACTRIM DS,SEPTRA DS) 800-160 MG tablet    Sig: Take 1 tablet by mouth 2 (two) times daily for 7 days.    Dispense:  14 tablet    Refill:  0  . albuterol (PROVENTIL HFA;VENTOLIN HFA) 108 (90 Base) MCG/ACT inhaler    Sig: Inhale 1-2 puffs into the lungs every 6 (six) hours as needed for wheezing or shortness of breath.    Dispense:  1 Inhaler    Refill:  11    Reviewed expectations re: course of current medical issues. Questions answered. Outlined signs and symptoms indicating need for more acute intervention. Patient verbalized understanding. After Visit Summary given.    Procedures:      Elvina Sidle, MD 12/13/17 1151

## 2017-12-13 NOTE — ED Triage Notes (Addendum)
Pt reports vaginal irritation and burning with urination x4 days ago.  Pt states her partner tested positive for chlamydia two days ago.  Pt also reports a cough x2 weeks

## 2017-12-14 LAB — URINE CULTURE: Culture: 10000 — AB

## 2017-12-14 LAB — URINE CYTOLOGY ANCILLARY ONLY
Chlamydia: NEGATIVE
Neisseria Gonorrhea: POSITIVE — AB
Trichomonas: NEGATIVE

## 2017-12-17 ENCOUNTER — Ambulatory Visit (HOSPITAL_COMMUNITY)
Admission: EM | Admit: 2017-12-17 | Discharge: 2017-12-17 | Disposition: A | Discharge status: Home / Self Care | Payer: Medicaid Other

## 2017-12-17 NOTE — Progress Notes (Signed)
Gonorrhea is positive.  Patient should return as soon as possible to the urgent care for treatment with IM rocephin . Pt was given azithromycin at urgent care visit. Patient will not need to see a provider unless there are new symptoms she would like evaluated. Pt called and made aware, educated patient to refrain from sexual intercourse for now and for 7 days after treatment to give the medicine time to work. Sexual partners need to be notified and tested/treated. Condoms may reduce risk of reinfection. Answered all patient questions. [facility] notified.

## 2017-12-18 ENCOUNTER — Other Ambulatory Visit: Payer: Self-pay

## 2017-12-18 ENCOUNTER — Ambulatory Visit (HOSPITAL_COMMUNITY)
Admission: EM | Admit: 2017-12-18 | Discharge: 2017-12-18 | Disposition: A | Discharge status: Home / Self Care | Payer: Medicaid Other | Attending: Family Medicine | Admitting: Family Medicine

## 2017-12-18 ENCOUNTER — Encounter (HOSPITAL_COMMUNITY): Payer: Self-pay | Admitting: Emergency Medicine

## 2017-12-18 DIAGNOSIS — A549 Gonococcal infection, unspecified: Secondary | ICD-10-CM | POA: Diagnosis not present

## 2017-12-18 LAB — URINE CYTOLOGY ANCILLARY ONLY
Bacterial vaginitis: NEGATIVE
Candida vaginitis: NEGATIVE

## 2017-12-18 MED ORDER — CEFTRIAXONE SODIUM 250 MG IJ SOLR
INTRAMUSCULAR | Status: AC
  Filled 2017-12-18: qty 250

## 2017-12-18 MED ORDER — LIDOCAINE HCL (PF) 1 % IJ SOLN
INTRAMUSCULAR | Status: AC
  Filled 2017-12-18: qty 2

## 2017-12-18 MED ORDER — CEFTRIAXONE SODIUM 250 MG IJ SOLR
250.0000 mg | Freq: Once | INTRAMUSCULAR | Status: AC
Start: 2017-12-18 — End: 2017-12-18
  Administered 2017-12-18: 250 mg via INTRAMUSCULAR

## 2017-12-18 NOTE — ED Provider Notes (Signed)
[facility]    CSN: [id number] Arrival date & time: 12/18/17  1316     History   Chief Complaint Chief Complaint  Patient presents with  . Exposure to STD    HPI Stacey Rodriguez is a 22 y.o. female history of herpes presenting today for treatment for gonorrhea as well as vaginal irritation.  Patient was seen here approximately 5 days ago for the symptoms, treated for chlamydia, but tested positive for gonorrhea.  She denies any fevers, nausea, vomiting, abdominal pain, back pain.  Denies any urinary symptoms of dysuria, increased frequency or urgency.  But does noting to have a lot of vaginal irritation and pain.  Patient still having discharge.  Denies any rashes or lesions.  HPI  Past Medical History:  Diagnosis Date  . Genital herpes   . Hx of chlamydia infection 2017  . Hx of trichomoniasis 2017    Patient Active Problem List   Diagnosis Date Noted  . Substance abuse (HCC) 11/11/2015  . Adjustment disorder with mixed anxiety and depressed mood 11/11/2015  . Vaginal discharge 11/12/2012  . ECZEMA 05/27/2008  . ACNE 05/27/2008    Past Surgical History:  Procedure Laterality Date  . NO PAST SURGERIES      OB History   None      Home Medications    Prior to Admission medications   Medication Sig Start Date End Date Taking? Authorizing Provider  albuterol (PROVENTIL HFA;VENTOLIN HFA) 108 (90 Base) MCG/ACT inhaler Inhale 1-2 puffs into the lungs every 6 (six) hours as needed for wheezing or shortness of breath. 12/13/17   Elvina Sidle, MD  sulfamethoxazole-trimethoprim (BACTRIM DS,SEPTRA DS) 800-160 MG tablet Take 1 tablet by mouth 2 (two) times daily for 7 days. 12/13/17 12/20/17  Elvina Sidle, MD    Family History Family History  Problem Relation Age of Onset  . Asthma Mother     Social History Social History   Tobacco Use  . Smoking status: Current Every Day Smoker  . Smokeless tobacco: Never Used  Substance Use Topics  . Alcohol  use: Yes    Comment: ocassionally  . Drug use: Yes    Types: Marijuana    Comment: Percocet addiction     Allergies   Patient has no known allergies.   Review of Systems Review of Systems  Constitutional: Negative for fever.  Respiratory: Negative for shortness of breath.   Cardiovascular: Negative for chest pain.  Gastrointestinal: Negative for abdominal pain, diarrhea, nausea and vomiting.  Genitourinary: Positive for pelvic pain and vaginal discharge. Negative for dysuria, flank pain, genital sores, hematuria, menstrual problem, vaginal bleeding and vaginal pain.  Musculoskeletal: Negative for back pain.  Skin: Negative for rash.  Neurological: Negative for dizziness, light-headedness and headaches.     Physical Exam Triage Vital Signs ED Triage Vitals [12/18/17 1419]  Enc Vitals Group     BP (!) 106/46     Pulse Rate 87     Resp      Temp 98.1 F (36.7 C)     Temp Source Oral     SpO2 99 %     Weight      Height      Head Circumference      Peak Flow      Pain Score 10     Pain Loc      Pain Edu?      Excl. in GC?    No data found.  Updated Vital Signs BP (!) 106/46 (BP  Location: Left Arm)   Pulse 87   Temp 98.1 F (36.7 C) (Oral)   LMP 12/04/2017 (Exact Date)   SpO2 99%   Visual Acuity Right Eye Distance:   Left Eye Distance:   Bilateral Distance:    Right Eye Near:   Left Eye Near:    Bilateral Near:     Physical Exam  Constitutional: She appears well-developed and well-nourished. No distress.  HENT:  Head: Normocephalic and atraumatic.  Eyes: Conjunctivae are normal.  Neck: Neck supple.  Cardiovascular: Normal rate and regular rhythm.  No murmur heard. Pulmonary/Chest: Effort normal and breath sounds normal. No respiratory distress.  Abdominal: Soft. There is no tenderness.  Abdomen is soft, nondistended, nontender to light and deep palpation throughout all 4 quadrants and suprapubic area.  Genitourinary:  Genitourinary Comments:  Patient declined pelvic exam  Musculoskeletal: She exhibits no edema.  Neurological: She is alert.  Skin: Skin is warm and dry.  Psychiatric: She has a normal mood and affect.  Nursing note and vitals reviewed.    UC Treatments / Results  Labs (all labs ordered are listed, but only abnormal results are displayed) Labs Reviewed - No data to display  EKG None  Radiology No results found.  Procedures Procedures (including critical care time)  Medications Ordered in UC Medications  cefTRIAXone (ROCEPHIN) injection 250 mg (has no administration in time range)    Initial Impression / Assessment and Plan / UC Course  I have reviewed the triage vital signs and the nursing notes.  Pertinent labs & imaging results that were available during my care of the patient were reviewed by me and considered in my medical decision making (see chart for details).     Patient recently testing positive for gonorrhea, will go ahead and treat with Rocephin.  Follow-up if pelvic pain not improving with treatment. Discussed strict return precautions. Patient verbalized understanding and is agreeable with plan.  Final Clinical Impressions(s) / UC Diagnoses   Final diagnoses:  Gonorrhea in female     Discharge Instructions     We have treated you today for gonorrhea, with rocephin. Please refrain from sexual intercourse for 7 days while medicines eliminating infection.   Please return if symptoms not improving with treatment, development of fever, nausea, vomiting, abdominal pain.     ED Prescriptions    None     Controlled Substance Prescriptions [place] Controlled Substance Registry consulted? Not Applicable   Lew Dawes, [place] 12/18/17 1500

## 2017-12-18 NOTE — ED Triage Notes (Signed)
Pt here for treatment of gonorrhea.  She is to get a 250 mg injection of Rocephin.  Pt was told she just needed treatment, but she states she wanted to see a doctor for the vaginal pain she is having and for them to examine her vaginally.

## 2017-12-18 NOTE — Discharge Instructions (Signed)
We have treated you today for gonorrhea, with rocephin. Please refrain from sexual intercourse for 7 days while medicines eliminating infection.   Please return if symptoms not improving with treatment, development of fever, nausea, vomiting, abdominal pain.

## 2018-01-16 ENCOUNTER — Telehealth: Payer: Self-pay | Admitting: Family Medicine

## 2018-01-16 NOTE — Telephone Encounter (Signed)
[facility] keeps putting in messages about this patient wanting her records from our practice but this patient has never been seen here at [facility] according to the chart.  [facility].

## 2018-03-25 ENCOUNTER — Encounter (HOSPITAL_COMMUNITY): Payer: Self-pay | Admitting: Emergency Medicine

## 2018-03-25 ENCOUNTER — Ambulatory Visit (HOSPITAL_COMMUNITY)
Admission: EM | Admit: 2018-03-25 | Discharge: 2018-03-25 | Disposition: A | Discharge status: Home / Self Care | Payer: Medicaid Other | Attending: Family Medicine | Admitting: Family Medicine

## 2018-03-25 DIAGNOSIS — Z825 Family history of asthma and other chronic lower respiratory diseases: Secondary | ICD-10-CM | POA: Insufficient documentation

## 2018-03-25 DIAGNOSIS — Z202 Contact with and (suspected) exposure to infections with a predominantly sexual mode of transmission: Secondary | ICD-10-CM

## 2018-03-25 DIAGNOSIS — N760 Acute vaginitis: Secondary | ICD-10-CM | POA: Insufficient documentation

## 2018-03-25 DIAGNOSIS — Z113 Encounter for screening for infections with a predominantly sexual mode of transmission: Secondary | ICD-10-CM

## 2018-03-25 DIAGNOSIS — F17200 Nicotine dependence, unspecified, uncomplicated: Secondary | ICD-10-CM | POA: Insufficient documentation

## 2018-03-25 DIAGNOSIS — Z79899 Other long term (current) drug therapy: Secondary | ICD-10-CM | POA: Insufficient documentation

## 2018-03-25 MED ORDER — CEFTRIAXONE SODIUM 250 MG IJ SOLR
250.0000 mg | Freq: Once | INTRAMUSCULAR | Status: AC
  Administered 2018-03-25: 250 mg via INTRAMUSCULAR

## 2018-03-25 MED ORDER — LIDOCAINE HCL (PF) 1 % IJ SOLN
INTRAMUSCULAR | Status: AC
  Filled 2018-03-25: qty 2

## 2018-03-25 MED ORDER — AZITHROMYCIN 250 MG PO TABS
ORAL_TABLET | ORAL | Status: AC
  Filled 2018-03-25: qty 4

## 2018-03-25 MED ORDER — AZITHROMYCIN 250 MG PO TABS
1000.0000 mg | ORAL_TABLET | Freq: Once | ORAL | Status: AC
  Administered 2018-03-25: 1000 mg via ORAL

## 2018-03-25 MED ORDER — CEFTRIAXONE SODIUM 250 MG IJ SOLR
INTRAMUSCULAR | Status: AC
  Filled 2018-03-25: qty 250

## 2018-03-25 MED ORDER — METRONIDAZOLE 500 MG PO TABS: 500.0000 mg | ORAL_TABLET | Freq: Two times a day (BID) | ORAL | 0 refills | Status: AC

## 2018-03-25 NOTE — ED Triage Notes (Signed)
Pt here requesting STD screening  

## 2018-03-25 NOTE — ED Provider Notes (Signed)
[facility]    CSN: [id number] Arrival date & time: 03/25/18  1207     History   Chief Complaint Chief Complaint  Patient presents with  . Exposure to STD    HPI Stacey Rodriguez is a 22 y.o. female.   Stacey Rodriguez presents with complaints of white vaginal discharge with itching which started approximately 1 week ago. Malodorous. Has been engaging in unprotected sex. No specific known std exposure. Has one partner. Has had bv in the past as well as gonorrhea- last 5/7. No abdominal pain. No fevers. No urinary symptoms. No bleeding.    ROS per HPI.      Past Medical History:  Diagnosis Date  . Genital herpes   . Hx of chlamydia infection 2017  . Hx of trichomoniasis 2017    Patient Active Problem List   Diagnosis Date Noted  . Substance abuse (HCC) 11/11/2015  . Adjustment disorder with mixed anxiety and depressed mood 11/11/2015  . Vaginal discharge 11/12/2012  . ECZEMA 05/27/2008  . ACNE 05/27/2008    Past Surgical History:  Procedure Laterality Date  . NO PAST SURGERIES      OB History   None      Home Medications    Prior to Admission medications   Medication Sig Start Date End Date Taking? Authorizing Provider  albuterol (PROVENTIL HFA;VENTOLIN HFA) 108 (90 Base) MCG/ACT inhaler Inhale 1-2 puffs into the lungs every 6 (six) hours as needed for wheezing or shortness of breath. 12/13/17   Elvina SidleLauenstein, Kurt, MD  metroNIDAZOLE (FLAGYL) 500 MG tablet Take 1 tablet (500 mg total) by mouth 2 (two) times daily for 7 days. 03/25/18 04/01/18  Georgetta HaberBurky, Miette Molenda B, NP    Family History Family History  Problem Relation Age of Onset  . Asthma Mother     Social History Social History   Tobacco Use  . Smoking status: Current Every Day Smoker  . Smokeless tobacco: Never Used  Substance Use Topics  . Alcohol use: Yes    Comment: ocassionally  . 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   Review of Systems Review of Systems   Physical Exam Triage Vital Signs ED Triage Vitals [03/25/18 1243]  Enc Vitals Group     BP (!) 98/59     Pulse Rate 78     Resp 18     Temp (!) 97.2 F (36.2 C)     Temp Source Oral     SpO2 100 %     Weight      Height      Head Circumference      Peak Flow      Pain Score 0     Pain Loc      Pain Edu?      Excl. in GC?    No data found.  Updated Vital Signs BP (!) 98/59 (BP Location: Left Arm)   Pulse 78   Temp (!) 97.2 F (36.2 C) (Oral)   Resp 18   SpO2 100%   Visual Acuity Right Eye Distance:   Left Eye Distance:   Bilateral Distance:    Right Eye Near:   Left Eye Near:    Bilateral Near:     Physical Exam  Constitutional: She is oriented to person, place, and time. She appears well-developed and well-nourished. No distress.  Cardiovascular: Normal rate, regular rhythm and normal heart  sounds.  Pulmonary/Chest: Effort normal and breath sounds normal.  Abdominal: Soft. Bowel sounds are normal. There is no tenderness. There is no rigidity, no rebound, no guarding and no CVA tenderness.  Genitourinary:  Genitourinary Comments: Denies sores, lesions, rash, bleeding, pelvic pain; gu exam deferred   Neurological: She is alert and oriented to person, place, and time.  Skin: Skin is warm and dry.     UC Treatments / Results  Labs (all labs ordered are listed, but only abnormal results are displayed) Labs Reviewed  CERVICOVAGINAL ANCILLARY ONLY    EKG None  Radiology No results found.  Procedures Procedures (including critical care time)  Medications Ordered in UC Medications  azithromycin (ZITHROMAX) tablet 1,000 mg (has no administration in time range)  cefTRIAXone (ROCEPHIN) injection 250 mg (has no administration in time range)    Initial Impression / Assessment and Plan / UC Course  I have reviewed the triage vital signs and the nursing notes.  Pertinent labs & imaging results that  were available during my care of the patient were reviewed by me and considered in my medical decision making (see chart for details).    Patient requests empiric treatment due to vaginal symptoms and possible exposure to std. Flagyl provided as well. Vaginal cytology pending. Will notify of any positive findings and if any changes to treatment are needed.  Safe sex practices encouraged. Patient verbalized understanding and agreeable to plan.    Final Clinical Impressions(s) / UC Diagnoses   Final diagnoses:  Acute vaginitis  Possible exposure to STD     Discharge Instructions     We have treated you today for gonorrhea and chlamydia.  I have sent medication to take for bacterial vaginosis. Do not drink alcohol while taking this.  Will notify you of any positive findings and if any changes to treatment are needed.   Please withhold from intercourse for the next week. Please use condoms to prevent STD's.   If symptoms worsen or do not improve in the next week to return to be seen or to follow up with your PCP.     ED Prescriptions    Medication Sig Dispense Auth. Provider   metroNIDAZOLE (FLAGYL) 500 MG tablet Take 1 tablet (500 mg total) by mouth 2 (two) times daily for 7 days. 14 tablet Georgetta HaberBurky, Westlee Devita B, NP     Controlled Substance Prescriptions [place] Controlled Substance Registry consulted? Not Applicable   Georgetta HaberBurky, Keegen Heffern B, NP 03/25/18 1334

## 2018-03-25 NOTE — Discharge Instructions (Signed)
We have treated you today for gonorrhea and chlamydia.  I have sent medication to take for bacterial vaginosis. Do not drink alcohol while taking this.  Will notify you of any positive findings and if any changes to treatment are needed.   Please withhold from intercourse for the next week. Please use condoms to prevent STD's.   If symptoms worsen or do not improve in the next week to return to be seen or to follow up with your PCP.

## 2018-03-26 LAB — CERVICOVAGINAL ANCILLARY ONLY
BACTERIAL VAGINITIS: NEGATIVE
CANDIDA VAGINITIS: POSITIVE — AB
Chlamydia: NEGATIVE
Neisseria Gonorrhea: POSITIVE — AB
Trichomonas: NEGATIVE

## 2018-03-28 ENCOUNTER — Telehealth (HOSPITAL_COMMUNITY): Payer: Self-pay

## 2018-03-28 MED ORDER — FLUCONAZOLE 150 MG PO TABS: 150.0000 mg | ORAL_TABLET | Freq: Every day | ORAL | 0 refills | Status: AC

## 2018-03-28 NOTE — Telephone Encounter (Signed)
Test for gonorrhea was positive. This was treated at the urgent care visit with IM rocephin 250mg  and po zithromax 1g. Pt called regarding test results, instructed patient to refrain from sexual intercourse for 7 days after treatment to give the medicine time to work. Sexual partners need to be notified and tested/treated. Condoms may reduce risk of reinfection. Recheck or followup with PCP for further evaluation if symptoms are not improving. Answered all patient questions. [facility] notified.   Pt contacted regarding test for candida (yeast) was positive.  Prescription for fluconazole 150mg  po now, repeat dose in 3d if needed, #2 no refills, sent to the pharmacy of record.  Recheck or followup with PCP for further evaluation if symptoms are not improving.  Answered all questions.

## 2018-06-24 ENCOUNTER — Encounter (HOSPITAL_COMMUNITY): Payer: Self-pay

## 2018-06-24 ENCOUNTER — Other Ambulatory Visit: Payer: Self-pay

## 2018-06-24 ENCOUNTER — Emergency Department (HOSPITAL_COMMUNITY)
Admission: EM | Admit: 2018-06-24 | Discharge: 2018-06-25 | Disposition: A | Discharge status: Home / Self Care | Payer: Medicaid Other | Attending: Emergency Medicine | Admitting: Emergency Medicine

## 2018-06-24 DIAGNOSIS — F1210 Cannabis abuse, uncomplicated: Secondary | ICD-10-CM | POA: Insufficient documentation

## 2018-06-24 DIAGNOSIS — F17200 Nicotine dependence, unspecified, uncomplicated: Secondary | ICD-10-CM | POA: Insufficient documentation

## 2018-06-24 DIAGNOSIS — R1115 Cyclical vomiting syndrome unrelated to migraine: Secondary | ICD-10-CM | POA: Insufficient documentation

## 2018-06-24 DIAGNOSIS — Z79899 Other long term (current) drug therapy: Secondary | ICD-10-CM | POA: Insufficient documentation

## 2018-06-24 LAB — RAPID URINE DRUG SCREEN, HOSP PERFORMED
AMPHETAMINES: NOT DETECTED
BENZODIAZEPINES: NOT DETECTED
Barbiturates: NOT DETECTED
Cocaine: NOT DETECTED
Opiates: NOT DETECTED
TETRAHYDROCANNABINOL: POSITIVE — AB

## 2018-06-24 LAB — CBC WITH DIFFERENTIAL/PLATELET
Abs Immature Granulocytes: 0.03 10*3/uL (ref 0.00–0.07)
BASOS PCT: 1 %
Basophils Absolute: 0 10*3/uL (ref 0.0–0.1)
EOS ABS: 0.2 10*3/uL (ref 0.0–0.5)
EOS PCT: 2 %
HCT: 33.6 % — ABNORMAL LOW (ref 36.0–46.0)
Hemoglobin: 11.2 g/dL — ABNORMAL LOW (ref 12.0–15.0)
Immature Granulocytes: 0 %
Lymphocytes Relative: 8 %
Lymphs Abs: 0.7 10*3/uL (ref 0.7–4.0)
MCH: 28.7 pg (ref 26.0–34.0)
MCHC: 33.3 g/dL (ref 30.0–36.0)
MCV: 86.2 fL (ref 80.0–100.0)
MONO ABS: 0.6 10*3/uL (ref 0.1–1.0)
MONOS PCT: 7 %
NEUTROS PCT: 82 %
Neutro Abs: 7 10*3/uL (ref 1.7–7.7)
PLATELETS: 160 10*3/uL (ref 150–400)
RBC: 3.9 MIL/uL (ref 3.87–5.11)
RDW: 15.9 % — AB (ref 11.5–15.5)
WBC: 8.5 10*3/uL (ref 4.0–10.5)
nRBC: 0 % (ref 0.0–0.2)

## 2018-06-24 LAB — URINALYSIS, ROUTINE W REFLEX MICROSCOPIC
Bilirubin Urine: NEGATIVE
GLUCOSE, UA: NEGATIVE mg/dL
HGB URINE DIPSTICK: NEGATIVE
Ketones, ur: 80 mg/dL — AB
Leukocytes, UA: NEGATIVE
Nitrite: NEGATIVE
PROTEIN: 30 mg/dL — AB
SPECIFIC GRAVITY, URINE: 1.016 (ref 1.005–1.030)
pH: 9 — ABNORMAL HIGH (ref 5.0–8.0)

## 2018-06-24 LAB — COMPREHENSIVE METABOLIC PANEL
ALT: 59 U/L — ABNORMAL HIGH (ref 0–44)
AST: 58 U/L — ABNORMAL HIGH (ref 15–41)
Albumin: 4.5 g/dL (ref 3.5–5.0)
Alkaline Phosphatase: 43 U/L (ref 38–126)
Anion gap: 12 (ref 5–15)
BILIRUBIN TOTAL: 0.9 mg/dL (ref 0.3–1.2)
BUN: 13 mg/dL (ref 6–20)
CHLORIDE: 104 mmol/L (ref 98–111)
CO2: 22 mmol/L (ref 22–32)
Calcium: 9.4 mg/dL (ref 8.9–10.3)
Creatinine, Ser: 0.79 mg/dL (ref 0.44–1.00)
Glucose, Bld: 149 mg/dL — ABNORMAL HIGH (ref 70–99)
POTASSIUM: 3.4 mmol/L — AB (ref 3.5–5.1)
Sodium: 138 mmol/L (ref 135–145)
TOTAL PROTEIN: 7.3 g/dL (ref 6.5–8.1)

## 2018-06-24 LAB — I-STAT BETA HCG BLOOD, ED (MC, WL, AP ONLY)

## 2018-06-24 LAB — LIPASE, BLOOD: Lipase: 23 U/L (ref 11–51)

## 2018-06-24 MED ORDER — SODIUM CHLORIDE 0.9 % IV BOLUS
1000.0000 mL | Freq: Once | INTRAVENOUS | Status: AC
  Administered 2018-06-24: 1000 mL via INTRAVENOUS

## 2018-06-24 MED ORDER — PROMETHAZINE HCL 25 MG/ML IJ SOLN
12.5000 mg | Freq: Once | INTRAMUSCULAR | Status: AC
  Administered 2018-06-25: 12.5 mg via INTRAVENOUS
  Filled 2018-06-24: qty 1

## 2018-06-24 MED ORDER — CAPSAICIN 0.025 % EX CREA
TOPICAL_CREAM | Freq: Once | CUTANEOUS | Status: AC
  Administered 2018-06-25: via TOPICAL
  Filled 2018-06-24: qty 60

## 2018-06-24 MED ORDER — HALOPERIDOL LACTATE 5 MG/ML IJ SOLN
2.0000 mg | Freq: Once | INTRAMUSCULAR | Status: AC
  Administered 2018-06-24: 2 mg via INTRAVENOUS
  Filled 2018-06-24: qty 1

## 2018-06-24 MED ORDER — FAMOTIDINE IN NACL 20-0.9 MG/50ML-% IV SOLN
20.0000 mg | Freq: Once | INTRAVENOUS | Status: AC
  Administered 2018-06-25: 20 mg via INTRAVENOUS
  Filled 2018-06-24: qty 50

## 2018-06-24 NOTE — ED Notes (Signed)
Bed: [facility] Expected date:  Expected time:  Means of arrival:  Comments: 22 yr old nausea, vomiting

## 2018-06-24 NOTE — ED Provider Notes (Signed)
[facility]EM[facility]GENCY [facility]PT Provider Note   CSN: [id number] Arrival date & time: 06/24/18  2114     History   Chief Complaint Chief Complaint  Patient presents with  . Drug Problem  . Nausea  . Emesis    HPI Stacey Rodriguez is a 22 y.o. female with a past medical history of substance abuse, who presents to ED for 5 episodes of nonbloody, nonbilious emesis, generalized abdominal pain and diarrhea that began prior to arrival.  Patient is a poor historian and is constantly telling me "just please help me."  Per triage note, patient arrived from a friend's house after taking 2 Percocet.  She has a history of Percocet abuse but did not take anything for the past month. She does tell me that she thinks she's vomiting because of the chicken she ate.  HPI  Past Medical History:  Diagnosis Date  . Genital herpes   . Hx of chlamydia infection 2017  . Hx of trichomoniasis 2017    Patient Active Problem List   Diagnosis Date Noted  . Substance abuse (HCC) 11/11/2015  . Adjustment disorder with mixed anxiety and depressed mood 11/11/2015  . Vaginal discharge 11/12/2012  . ECZEMA 05/27/2008  . ACNE 05/27/2008    Past Surgical History:  Procedure Laterality Date  . NO PAST SURGERIES       OB History   None      Home Medications    Prior to Admission medications   Medication Sig Start Date End Date Taking? Authorizing Provider  albuterol (PROVENTIL HFA;VENTOLIN HFA) 108 (90 Base) MCG/ACT inhaler Inhale 1-2 puffs into the lungs every 6 (six) hours as needed for wheezing or shortness of breath. 12/13/17   Elvina Sidle, MD    Family History Family History  Problem Relation Age of Onset  . Asthma Mother     Social History Social History   Tobacco Use  . Smoking status: Current Every Day Smoker  . Smokeless tobacco: Never Used  Substance Use Topics  . Alcohol use: Yes    Comment: ocassionally  . Drug use: Yes    Types: Marijuana   Comment: Percocet addiction     Allergies   Patient has no known allergies.   Review of Systems Review of Systems  Constitutional: Negative for appetite change, chills and fever.  HENT: Negative for ear pain, rhinorrhea, sneezing and sore throat.   Eyes: Negative for photophobia and visual disturbance.  Respiratory: Negative for cough, chest tightness, shortness of breath and wheezing.   Cardiovascular: Negative for chest pain and palpitations.  Gastrointestinal: Positive for abdominal pain, diarrhea, nausea and vomiting. Negative for blood in stool and constipation.  Genitourinary: Negative for dysuria, hematuria and urgency.  Musculoskeletal: Negative for myalgias.  Skin: Negative for rash.  Neurological: Negative for dizziness, weakness and light-headedness.     Physical Exam Updated Vital Signs BP 108/72 (BP Location: Left Arm)   Pulse 75   Temp 97.9 F (36.6 C) (Oral)   Resp 18   Ht 5' (1.524 m)   Wt 61.2 kg   SpO2 99%   BMI 26.37 kg/m   Physical Exam  Constitutional: She appears well-developed and well-nourished. No distress.  HENT:  Head: Normocephalic and atraumatic.  Nose: Nose normal.  Eyes: Conjunctivae and EOM are normal. Left eye exhibits no discharge. No scleral icterus.  Neck: Normal range of motion. Neck supple.  Cardiovascular: Normal rate, regular rhythm, normal heart sounds and intact distal pulses. Exam reveals no gallop and no  friction rub.  No murmur heard. Pulmonary/Chest: Effort normal and breath sounds normal. No respiratory distress.  Abdominal: Soft. Bowel sounds are normal. She exhibits no distension. There is no tenderness. There is no guarding.  Musculoskeletal: Normal range of motion. She exhibits no edema.  Neurological: She is alert. She exhibits normal muscle tone. Coordination normal.  Skin: Skin is warm and dry. No rash noted.  Psychiatric: She has a normal mood and affect.  Nursing note and vitals reviewed.    ED Treatments /  Results  Labs (all labs ordered are listed, but only abnormal results are displayed) Labs Reviewed  CBC WITH DIFFERENTIAL/PLATELET - Abnormal; Notable for the following components:      Result Value   Hemoglobin 11.2 (*)    HCT 33.6 (*)    RDW 15.9 (*)    All other components within normal limits  COMPREHENSIVE METABOLIC PANEL - Abnormal; Notable for the following components:   Potassium 3.4 (*)    Glucose, Bld 149 (*)    AST 58 (*)    ALT 59 (*)    All other components within normal limits  URINALYSIS, ROUTINE W REFLEX MICROSCOPIC - Abnormal; Notable for the following components:   APPearance HAZY (*)    pH 9.0 (*)    Ketones, ur 80 (*)    Protein, ur 30 (*)    Bacteria, UA RARE (*)    All other components within normal limits  RAPID URINE DRUG SCREEN, HOSP PERFORMED - Abnormal; Notable for the following components:   Tetrahydrocannabinol POSITIVE (*)    All other components within normal limits  LIPASE, BLOOD  I-STAT BETA HCG BLOOD, ED ([facility], [facility], [facility] ONLY)    EKG None  Radiology No results found.  Procedures Procedures (including critical care time)  Medications Ordered in ED Medications  promethazine (PHENERGAN) injection 12.5 mg (has no administration in time range)  capsaicin (ZOSTRIX) 0.025 % cream (has no administration in time range)  famotidine (PEPCID) IVPB 20 mg premix (has no administration in time range)  sodium chloride 0.9 % bolus 1,000 mL (1,000 mLs Intravenous New Bag/Given 06/24/18 2218)  haloperidol lactate (HALDOL) injection 2 mg (2 mg Intravenous Given 06/24/18 2218)     Initial Impression / Assessment and Plan / ED Course  I have reviewed the triage vital signs and the nursing notes.  Pertinent labs & imaging results that were available during my care of the patient were reviewed by me and considered in my medical decision making (see chart for details).  Clinical Course as of Jun 24 2325  Mon Jun 24, 2018  2326 Tetrahydrocannabinol(!):  POSITIVE [HK]  2326 Ketones, ur(!): 80 [HK]    Clinical Course User Index [HK] Dietrich Pates, [place]    22 year old female presents to ED for nausea, vomiting, diarrhea and generalized abdominal pain that began prior to arrival.  Patient is a poor historian.  Triage note states that she took 2 Percocet prior to arrival.  She has a known history of substance abuse but apparently did not take Percocet for the past month.  UDS positive for THC.  CBC, CMP, urinalysis, lipase unremarkable.  hCG is negative.  Patient given fluids, Haldol but continuing to complain of abdominal pain, asking someone to "rub my back."  Will give additional meds and reassess. Patient most likely having cyclical vomiting 2/2 marijuana use. Care handed off to oncoming provider PA Lawyer pending recheck after meds. On my last reassessment, patient resting comfortably but will recommend administering medications to prevent  recurrence or worsening of symptoms.    Portions of this note were generated with Scientist, clinical (histocompatibility and immunogenetics). Dictation errors may occur despite best attempts at proofreading.  Final Clinical Impressions(s) / ED Diagnoses   Final diagnoses:  Cyclical vomiting    ED Discharge Orders    None       Dietrich Pates, PA-C 06/24/18 2344    Lorre Nick, MD 06/28/18 1704

## 2018-06-24 NOTE — ED Triage Notes (Signed)
Per EMS,  Pt is arriving from a friends house. Has a htx of Percocet use. Pt has admitted to taking two today after apprx. a month hiatus. N/V since 1900, feeling weak, faint, and cold chills. Pt was given 500 ml of NS, and 4mg  of Zofran.

## 2018-06-25 NOTE — ED Notes (Signed)
Pt decided to leave AMA. I asked if she had a reason or if there was anything we could do to convince her to stay. Pt responded "im just going to go". Pt had removed her own IV, I was unable to see if the catheter tip was intact upon removal. I wrapped the pts iv site in 2x2 gauze, and wrapped in coban.

## 2018-08-08 ENCOUNTER — Emergency Department (HOSPITAL_COMMUNITY)
Admission: EM | Admit: 2018-08-08 | Discharge: 2018-08-08 | Disposition: A | Discharge status: Home / Self Care | Payer: Medicaid Other | Attending: Emergency Medicine | Admitting: Emergency Medicine

## 2018-08-08 ENCOUNTER — Encounter (HOSPITAL_COMMUNITY): Payer: Self-pay | Admitting: Emergency Medicine

## 2018-08-08 DIAGNOSIS — Z5321 Procedure and treatment not carried out due to patient leaving prior to being seen by health care provider: Secondary | ICD-10-CM | POA: Insufficient documentation

## 2018-08-08 DIAGNOSIS — R110 Nausea: Secondary | ICD-10-CM | POA: Insufficient documentation

## 2018-08-08 DIAGNOSIS — R109 Unspecified abdominal pain: Secondary | ICD-10-CM | POA: Insufficient documentation

## 2018-08-08 NOTE — ED Notes (Signed)
While writer was obtaining VS patient was asking for something for pain, ginger ale and crackers. Patient informed that she could not be given anything at this time. Patient immediately stated, "I want to go home." patient encouraged to stay. Patient again stated that she wanted to go home.

## 2018-08-08 NOTE — ED Triage Notes (Addendum)
Per EMS, nausea and abdominal discomfort due to running out of percocet-states she uses med for recreation-states same thing happened last week when she ran out-4 mg of IV Zofran given in route-states abdominal pain has resolved

## 2018-08-08 NOTE — ED Notes (Signed)
IV from EMS removed by Daphine DeutscherMartin, NT prior to patient going back out to the lobby.

## 2018-09-25 ENCOUNTER — Ambulatory Visit (HOSPITAL_COMMUNITY)
Admission: EM | Admit: 2018-09-25 | Discharge: 2018-09-25 | Discharge status: Against Medical Advice | Payer: Self-pay | Attending: Internal Medicine | Admitting: Internal Medicine

## 2018-09-25 NOTE — ED Notes (Signed)
Pt wanted an specifically wanted an ultrasound she decided to seek further evaluation at another facility.

## 2018-11-23 ENCOUNTER — Other Ambulatory Visit: Payer: Self-pay

## 2018-11-23 ENCOUNTER — Ambulatory Visit (HOSPITAL_COMMUNITY)
Admission: EM | Admit: 2018-11-23 | Discharge: 2018-11-23 | Disposition: A | Discharge status: Home / Self Care | Payer: Medicaid Other | Attending: Physician Assistant | Admitting: Physician Assistant

## 2018-11-23 DIAGNOSIS — Z3202 Encounter for pregnancy test, result negative: Secondary | ICD-10-CM

## 2018-11-23 DIAGNOSIS — N898 Other specified noninflammatory disorders of vagina: Secondary | ICD-10-CM | POA: Diagnosis not present

## 2018-11-23 DIAGNOSIS — Z113 Encounter for screening for infections with a predominantly sexual mode of transmission: Secondary | ICD-10-CM

## 2018-11-23 LAB — POCT PREGNANCY, URINE: Preg Test, Ur: NEGATIVE

## 2018-11-23 MED ORDER — FLUCONAZOLE 150 MG PO TABS: 150.0000 mg | ORAL_TABLET | Freq: Once | ORAL | 0 refills | Status: AC

## 2018-11-23 NOTE — Discharge Instructions (Signed)
Please start diflucan today.

## 2018-11-23 NOTE — ED Provider Notes (Signed)
11/23/2018 1:22 PM   DOB: Jun 16, 1996 / MRN: [id number]  SUBJECTIVE:  Stacey Rodriguez is a 23 y.o. female presenting vagimal irritation and creamy white discharge that started a few days ago.  Assoicates dysparunia.  Denies fever.  Has tried nothing.   She has No Known Allergies.   She  has a past medical history of Genital herpes, chlamydia infection (2017), and trichomoniasis (2017).    She  reports that she has been smoking. She has never used smokeless tobacco. She reports current alcohol use. She reports current drug use. Drug: Marijuana. She  reports being sexually active. She reports using the following method of birth control/protection: None. The patient  has a past surgical history that includes No past surgeries.  Her family history includes Asthma in her mother.   OBJECTIVE:  BP 115/70 (BP Location: Right Arm)   Pulse 82   Temp 97.6 F (36.4 C) (Oral)   Resp 16   LMP 11/02/2018   SpO2 98%   Wt Readings from Last 3 Encounters:  06/24/18 135 lb (61.2 kg)  10/01/17 135 lb (61.2 kg)  05/14/17 131 lb 8 oz (59.6 kg)   Temp Readings from Last 3 Encounters:  11/23/18 97.6 F (36.4 C) (Oral)  08/08/18 99 F (37.2 C)  06/24/18 97.9 F (36.6 C) (Oral)   BP Readings from Last 3 Encounters:  11/23/18 115/70  08/08/18 (!) 145/94  06/25/18 109/76   Pulse Readings from Last 3 Encounters:  11/23/18 82  08/08/18 74  06/25/18 85    Physical Exam Vitals signs reviewed.  Constitutional:      General: She is not in acute distress.    Appearance: She is not diaphoretic.  Eyes:     Pupils: Pupils are equal, round, and reactive to light.  Cardiovascular:     Rate and Rhythm: Normal rate.  Pulmonary:     Effort: Pulmonary effort is normal.  Abdominal:     General: There is no distension.  Skin:    General: Skin is dry.  Neurological:     Mental Status: She is alert and oriented to person, place, and time.     Cranial Nerves: No cranial nerve deficit.     Gait:  Gait normal.     No results found for this or any previous visit (from the past 72 hour(s)).  No results found.  ASSESSMENT AND PLAN:   Vaginal discharge: Will screen for std, yeast, and BV.  Will screen for preg.     Discharge Instructions     Please start diflucan today.         The patient is advised to call or return to clinic if she does not see an improvement in symptoms, or to seek the care of the closest emergency department if she worsens with the above plan.   Deliah Boston, MHS, PA-C 11/23/2018 1:22 PM   Ofilia Neas, PA-C 11/23/18 1322

## 2018-11-23 NOTE — ED Triage Notes (Signed)
Per pt she has been having itching and discharge for about 3 days. Pt says her vagina is very tender and hurts. Very discomfortable.

## 2018-11-25 LAB — CERVICOVAGINAL ANCILLARY ONLY
Bacterial vaginitis: POSITIVE — AB
Candida vaginitis: NEGATIVE
Chlamydia: POSITIVE — AB
Neisseria Gonorrhea: NEGATIVE
Trichomonas: NEGATIVE

## 2018-11-27 ENCOUNTER — Telehealth (HOSPITAL_COMMUNITY): Payer: Self-pay | Admitting: Emergency Medicine

## 2018-11-27 MED ORDER — METRONIDAZOLE 500 MG PO TABS: 500.0000 mg | ORAL_TABLET | Freq: Two times a day (BID) | ORAL | 0 refills | Status: AC

## 2018-11-27 MED ORDER — AZITHROMYCIN 250 MG PO TABS: 1000.0000 mg | ORAL_TABLET | Freq: Once | ORAL | 0 refills | Status: AC

## 2018-11-27 NOTE — Telephone Encounter (Signed)
Chlamydia is positive.  Rx po zithromax 1g #1 dose no refills was sent to the pharmacy of record.  Pt needs education to please refrain from sexual intercourse for 7 days to give the medicine time to work, sexual partners need to be notified and tested/treated.  Condoms may reduce risk of reinfection.  Recheck or followup with PCP for further evaluation if symptoms are not improving.   [facility] notified.  Bacterial vaginosis is positive. This was not treated at the urgent care visit.  Flagyl 500 mg BID x 7 days #14 no refills sent to patients pharmacy of choice.    Patient contacted and made aware of all results, all questions answered.   

## 2018-12-30 ENCOUNTER — Encounter: Payer: Self-pay | Admitting: Obstetrics & Gynecology

## 2018-12-30 ENCOUNTER — Other Ambulatory Visit: Payer: Self-pay

## 2018-12-30 ENCOUNTER — Ambulatory Visit (INDEPENDENT_AMBULATORY_CARE_PROVIDER_SITE_OTHER): Payer: Self-pay

## 2018-12-30 DIAGNOSIS — Z3201 Encounter for pregnancy test, result positive: Secondary | ICD-10-CM

## 2018-12-30 LAB — POCT PREGNANCY, URINE: Preg Test, Ur: POSITIVE — AB

## 2018-12-30 MED ORDER — PROMETHAZINE HCL 25 MG PO TABS: 25.0000 mg | ORAL_TABLET | Freq: Four times a day (QID) | ORAL | 0 refills | Status: DC | PRN

## 2018-12-30 NOTE — Progress Notes (Signed)
Agree with A & P. 

## 2018-12-30 NOTE — Progress Notes (Signed)
Pt here today for pregnancy test.  Resulted positive.  Pt reports LMP 11/24/18  EDD 08/30/18 5w 1d.   Medications reconciled.  Pt reports she is having nausea.  E-prescribed Phenergan 25 mg po to [facility] on [address] per protocol.  Proof of pregnancy provided by the front office.    Lambert Keto., RN, BSN 12/30/18

## 2018-12-31 ENCOUNTER — Telehealth: Payer: Self-pay | Admitting: *Deleted

## 2018-12-31 NOTE — Telephone Encounter (Signed)
-----   Message from Lanney Gins, CMA sent at 12/31/2018 11:46 AM EDT ----- Regarding: recent call This pt contacted our office yesterday afternoon, no msg left. Looks like she was seen by your office yesterday. Could you please follow up on pt call.   Thanks

## 2018-12-31 NOTE — Telephone Encounter (Signed)
Attempted to return pt's call.  Pt did not pick up.  Left message advising pt that I was returning her call and advising her to contact the clinic if she continues to have questions or concerns.

## 2019-01-24 ENCOUNTER — Telehealth: Payer: Self-pay | Admitting: Family Medicine

## 2019-01-24 NOTE — Telephone Encounter (Signed)
Attempted to call patient about her appointment on 6/15 @ 1:15. No answer, left a detailed voicemail instructing patient that the visit is a mychart visit and the app needs to be downloaded prior to the appointment if not already done so. Patient was not screened for symtpoms due to it being a virtual visit

## 2019-01-27 ENCOUNTER — Other Ambulatory Visit: Payer: Self-pay

## 2019-01-27 ENCOUNTER — Telehealth (INDEPENDENT_AMBULATORY_CARE_PROVIDER_SITE_OTHER): Payer: Medicaid Other | Admitting: *Deleted

## 2019-01-27 ENCOUNTER — Encounter: Payer: Self-pay | Admitting: *Deleted

## 2019-01-27 VITALS — Ht 68.0 in

## 2019-01-27 DIAGNOSIS — F1910 Other psychoactive substance abuse, uncomplicated: Secondary | ICD-10-CM

## 2019-01-27 DIAGNOSIS — O0990 Supervision of high risk pregnancy, unspecified, unspecified trimester: Secondary | ICD-10-CM

## 2019-01-27 DIAGNOSIS — Z3480 Encounter for supervision of other normal pregnancy, unspecified trimester: Secondary | ICD-10-CM | POA: Insufficient documentation

## 2019-01-27 MED ORDER — AMBULATORY NON FORMULARY MEDICATION: 1.0000 | 0 refills | Status: DC

## 2019-01-27 NOTE — Progress Notes (Signed)
I have reviewed the chart and agree with nursing staff's documentation of this patient's encounter.  Kerry Hough, PA-C 01/27/2019 3:15 PM

## 2019-01-27 NOTE — Progress Notes (Signed)
1:00 I called Tonna for her virtual visit - phone rang over 10 times- no answer or voicemail- unable to leave message.  Linda,RN 1:09 Devorah not in virtual room . I called Makinna again for her virtual visit and again the phone rang over 10 times- no answer and no voice mail.  I called her contact number and she answered .She stated her phone is broke, using Mom's phone.  Linda,RN I connected with  Luxe Reed-Cummings on 01/27/19 at  1:15 PM EDT by telephone and verified that I am speaking with the correct person using two identifiers.   I discussed the limitations, risks, security and privacy concerns of performing an evaluation and management service by telephone and the availability of in person appointments. I also discussed with the patient that there may be a patient responsible charge related to this service. The patient expressed understanding and agreed to proceed. Is having headaches about 2 weeks and are relieved with tylenol. Advised to monitor for other symptoms of coronavirus and call md if needed.  I explained I will send her [employer] app by email. She states she does not have a bp cuff. I also explained I will send her a bp cuff and she should take her bp weekly and log it into [employer] app.  I reviewed her new ob appt with her and that she should wear a mask . She voices understanding.  Linda,RN 01/27/2019  1:13 PM

## 2019-01-31 ENCOUNTER — Encounter (HOSPITAL_COMMUNITY): Payer: Self-pay

## 2019-01-31 ENCOUNTER — Other Ambulatory Visit: Payer: Self-pay

## 2019-01-31 ENCOUNTER — Ambulatory Visit (HOSPITAL_COMMUNITY)
Admission: EM | Admit: 2019-01-31 | Discharge: 2019-01-31 | Disposition: A | Discharge status: Home / Self Care | Payer: Medicaid Other | Attending: Internal Medicine | Admitting: Internal Medicine

## 2019-01-31 DIAGNOSIS — A749 Chlamydial infection, unspecified: Secondary | ICD-10-CM | POA: Diagnosis not present

## 2019-01-31 DIAGNOSIS — N898 Other specified noninflammatory disorders of vagina: Secondary | ICD-10-CM | POA: Diagnosis not present

## 2019-01-31 MED ORDER — AZITHROMYCIN 250 MG PO TABS
1000.0000 mg | ORAL_TABLET | Freq: Once | ORAL | Status: AC
  Administered 2019-01-31: 1000 mg via ORAL

## 2019-01-31 MED ORDER — CEFTRIAXONE SODIUM 250 MG IJ SOLR: 250.0000 mg | Freq: Once | INTRAMUSCULAR | Status: DC

## 2019-01-31 MED ORDER — CEFTRIAXONE SODIUM 250 MG IJ SOLR
INTRAMUSCULAR | Status: AC
  Filled 2019-01-31: qty 250

## 2019-01-31 MED ORDER — AZITHROMYCIN 250 MG PO TABS
ORAL_TABLET | ORAL | Status: AC
  Filled 2019-01-31: qty 4

## 2019-01-31 NOTE — Discharge Instructions (Signed)
Will notify you of any positive findings and if any changes to treatment are needed.   Please withhold from intercourse for the next week. Please use condoms to prevent STD's.   Please continue to follow with your OB

## 2019-01-31 NOTE — ED Triage Notes (Signed)
Pt presents with vaginal discharge, vaginal irritation and odor for over 2 months; pt also states she tested positive for chylamydia 2 months ago and never came in to receive treatment so she would like treatment for that today. Pt is about [redacted] weeks pregnant.

## 2019-01-31 NOTE — ED Provider Notes (Signed)
[place]    CSN: [id number] Arrival date & time: 01/31/19  1030      History   Chief Complaint Chief Complaint  Patient presents with  . Exposure to STD    HPI Stacey Rodriguez is a 23 y.o. female.   Stacey Rodriguez presents with complaints of vaginal discharge with odor. Tested positive for chlamydia 4/11, but did not pick up or take treatment for this. She has been sexually active without condoms since then. She is approximately [redacted] weeks pregnant, LMP 4/12. She has had her first video visit with ob. No vaginal bleeding. No pelvic pain. She has vaginal itching as well. Denies sores or lesions. Hx of STI's and substance abuse.     ROS per HPI, negative if not otherwise mentioned.      Past Medical History:  Diagnosis Date  . Genital herpes   . Hx of chlamydia infection 2017  . Hx of trichomoniasis 2017    Patient Active Problem List   Diagnosis Date Noted  . Supervision of high risk pregnancy, antepartum 01/27/2019  . Substance abuse ([place]) 11/11/2015  . Adjustment disorder with mixed anxiety and depressed mood 11/11/2015  . Vaginal discharge 11/12/2012  . ECZEMA 05/27/2008  . ACNE 05/27/2008    Past Surgical History:  Procedure Laterality Date  . NO PAST SURGERIES      OB History    Gravida  1   Para      Term      Preterm      AB      Living        SAB      TAB      Ectopic      Multiple      Live Births               Home Medications    Prior to Admission medications   Medication Sig Start Date End Date Taking? Authorizing Provider  albuterol (PROVENTIL HFA;VENTOLIN HFA) 108 (90 Base) MCG/ACT inhaler Inhale 1-2 puffs into the lungs every 6 (six) hours as needed for wheezing or shortness of breath. 12/13/17   Robyn Haber, MD  AMBULATORY NON FORMULARY MEDICATION 1 Device by Other route once a week. Medication Name: Blood Pressure Cuff Monitored regularly at home  ICD 10 O09.90 01/27/19   Luvenia Redden,  PA-C  Prenatal Vit-Fe Fumarate-FA (PRENATAL VITAMINS PO) Take 1 tablet by mouth daily.    [provider]  promethazine (PHENERGAN) 25 MG tablet Take 1 tablet (25 mg total) by mouth every 6 (six) hours as needed for nausea or vomiting. 12/30/18   Chancy Milroy, MD  valACYclovir (VALTREX) 500 MG tablet Take 500 mg by mouth as needed.    [provider]    Family History Family History  Problem Relation Age of Onset  . Asthma Mother     Social History Social History   Tobacco Use  . Smoking status: Never Smoker  . Smokeless tobacco: Never Used  Substance Use Topics  . Alcohol use: Not Currently    Comment: ocassionally  . 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   Review of Systems Review of Systems   Physical Exam Triage Vital Signs ED Triage Vitals  Enc Vitals Group     BP 01/31/19 1048  119/71     Pulse Rate 01/31/19 1048 88     Resp 01/31/19 1048 16     Temp 01/31/19 1048 98.3 F (36.8 C)     Temp Source 01/31/19 1048 Oral     SpO2 01/31/19 1048 100 %     Weight --      Height --      Head Circumference --      Peak Flow --      Pain Score 01/31/19 1055 3     Pain Loc --      Pain Edu? --      Excl. in GC? --    No data found.  Updated Vital Signs BP 119/71 (BP Location: Right Arm)   Pulse 88   Temp 98.3 F (36.8 C) (Oral)   Resp 16   LMP 11/24/2018   SpO2 100%   Visual Acuity Right Eye Distance:   Left Eye Distance:   Bilateral Distance:    Right Eye Near:   Left Eye Near:    Bilateral Near:     Physical Exam Constitutional:      General: She is not in acute distress.    Appearance: She is well-developed.  Cardiovascular:     Rate and Rhythm: Normal rate and regular rhythm.     Heart sounds: Normal heart sounds.  Pulmonary:     Effort: Pulmonary effort is normal.      Breath sounds: Normal breath sounds.  Abdominal:     Palpations: Abdomen is soft. Abdomen is not rigid.     Tenderness: There is no abdominal tenderness. There is no guarding or rebound.  Genitourinary:    Comments: Denies sores, lesions, vaginal bleeding; no pelvic pain; gu exam deferred at this time, vaginal self swab collected.   Skin:    General: Skin is warm and dry.  Neurological:     Mental Status: She is alert and oriented to person, place, and time.      UC Treatments / Results  Labs (all labs ordered are listed, but only abnormal results are displayed) Labs Reviewed  CERVICOVAGINAL ANCILLARY ONLY    EKG None  Radiology No results found.  Procedures Procedures (including critical care time)  Medications Ordered in UC Medications  azithromycin (ZITHROMAX) tablet 1,000 mg (1,000 mg Oral Given 01/31/19 1125)  azithromycin (ZITHROMAX) 250 MG tablet (has no administration in time range)  cefTRIAXone (ROCEPHIN) 250 MG injection (has no administration in time range)    Initial Impression / Assessment and Plan / UC Course  I have reviewed the triage vital signs and the nursing notes.  Pertinent labs & imaging results that were available during my care of the patient were reviewed by me and considered in my medical decision making (see chart for details).     Will repeat vaginal testing as has been 2 months and patient has been sexually active with condoms since last testing. Encouraged rocephin as well here today but patient declines. Azithromycin 1g provided. Encouraged safe sex practices. Will await any further BV or candida treatment for results. Will notify of any positive findings and if any changes to treatment are needed.  Patient verbalized understanding and agreeable to plan.    Final Clinical Impressions(s) / UC Diagnoses   Final diagnoses:  Chlamydia  Vaginal discharge     Discharge Instructions     Will notify you of any positive findings and if any  changes to treatment are needed.   Please withhold from intercourse for  the next week. Please use condoms to prevent STD's.   [facility]    ED Prescriptions    None     Controlled Substance Prescriptions [place] Controlled Substance Registry consulted? Not Applicable   Georgetta HaberBurky, Avenell Sellers B, NP 01/31/19 1135

## 2019-02-03 LAB — CERVICOVAGINAL ANCILLARY ONLY
Bacterial vaginitis: POSITIVE — AB
Candida vaginitis: POSITIVE — AB
Chlamydia: POSITIVE — AB
Neisseria Gonorrhea: NEGATIVE
Trichomonas: NEGATIVE

## 2019-02-06 ENCOUNTER — Telehealth (HOSPITAL_COMMUNITY): Payer: Self-pay | Admitting: Emergency Medicine

## 2019-02-06 DIAGNOSIS — O0990 Supervision of high risk pregnancy, unspecified, unspecified trimester: Secondary | ICD-10-CM | POA: Diagnosis not present

## 2019-02-06 MED ORDER — METRONIDAZOLE 500 MG PO TABS: 500.0000 mg | ORAL_TABLET | Freq: Two times a day (BID) | ORAL | 0 refills | Status: AC

## 2019-02-06 MED ORDER — FLUCONAZOLE 150 MG PO TABS: 150.0000 mg | ORAL_TABLET | Freq: Once | ORAL | 0 refills | Status: AC

## 2019-02-06 NOTE — Telephone Encounter (Signed)
Chlamydia is positive.  This was treated at the urgent care visit with po zithromax 1g.  Pt needs education to please refrain from sexual intercourse for 7 days to give the medicine time to work.  Sexual partners need to be notified and tested/treated.  Condoms may reduce risk of reinfection.  Recheck or followup with PCP for further evaluation if symptoms are not improving.  [facility] notified.  Bacterial vaginosis is positive. This was not treated at the urgent care visit.  Flagyl 500 mg BID x 7 days #14 no refills sent to patients pharmacy of choice.    Test for candida (yeast) was positive.  Prescription for fluconazole 150mg  po now, repeat dose in 3d if needed, #2 no refills, sent to the pharmacy of record.  Recheck or followup with PCP for further evaluation if symptoms are not improving.    Patient contacted and made aware of all results, all questions answered.  Per Dr. Lanny Cramp okay to send normal dose of flagyl and diflucan.

## 2019-02-11 ENCOUNTER — Other Ambulatory Visit: Payer: Self-pay

## 2019-02-11 ENCOUNTER — Other Ambulatory Visit (HOSPITAL_COMMUNITY)
Admission: RE | Admit: 2019-02-11 | Discharge: 2019-02-11 | Disposition: A | Discharge status: Home / Self Care | Payer: Medicaid Other | Source: Ambulatory Visit | Attending: Advanced Practice Midwife | Admitting: Advanced Practice Midwife

## 2019-02-11 ENCOUNTER — Ambulatory Visit (INDEPENDENT_AMBULATORY_CARE_PROVIDER_SITE_OTHER): Payer: Medicaid Other | Admitting: Advanced Practice Midwife

## 2019-02-11 ENCOUNTER — Encounter: Payer: Self-pay | Admitting: Advanced Practice Midwife

## 2019-02-11 VITALS — BP 106/65 | HR 90 | Temp 98.0°F | Wt 157.1 lb

## 2019-02-11 DIAGNOSIS — Z3A11 11 weeks gestation of pregnancy: Secondary | ICD-10-CM

## 2019-02-11 DIAGNOSIS — Z3480 Encounter for supervision of other normal pregnancy, unspecified trimester: Secondary | ICD-10-CM | POA: Insufficient documentation

## 2019-02-11 DIAGNOSIS — Z315 Encounter for genetic counseling: Secondary | ICD-10-CM | POA: Diagnosis not present

## 2019-02-11 DIAGNOSIS — Z3481 Encounter for supervision of other normal pregnancy, first trimester: Secondary | ICD-10-CM

## 2019-02-11 MED ORDER — HYDROCORTISONE 1 % EX CREA: 1.0000 "application " | TOPICAL_CREAM | Freq: Two times a day (BID) | CUTANEOUS | 0 refills | Status: DC

## 2019-02-11 NOTE — Progress Notes (Signed)
Subjective:   Stacey Rodriguez is a 23 y.o. G1P0 at 82w2dby LMP being seen today for her first obstetrical visit.  Her obstetrical history is significant for none. Patient is thinking about  breast feeding. Pregnancy history fully reviewed.  Patient reports no complaints.  HISTORY: OB History  Gravida Para Term Preterm AB Living  1 0 0 0 0 0  SAB TAB Ectopic Multiple Live Births  0 0 0 0 0    # Outcome Date GA Lbr Len/2nd Weight Sex Delivery Anes PTL Lv  1 Current             Last pap smear was done unsure, but she thinks she is overdue. She is unsure if she has had any abnormal pap smears in the past.   Past Medical History:  Diagnosis Date  . Genital herpes   . Hx of chlamydia infection 2017  . Hx of trichomoniasis 2017   Past Surgical History:  Procedure Laterality Date  . NO PAST SURGERIES     Family History  Problem Relation Age of Onset  . Asthma Mother    Social History   Tobacco Use  . Smoking status: Never Smoker  . Smokeless tobacco: Never Used  Substance Use Topics  . Alcohol use: Not Currently    Comment: ocassionally  . 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 albuterol (PROVENTIL HFA;VENTOLIN HFA) 108 (90 Base) MCG/ACT inhaler Inhale 1-2 puffs into the lungs every 6 (six) hours as needed for wheezing or shortness of breath. 1 Inhaler 11  . AMBULATORY NON FORMULARY MEDICATION 1 Device by Other route once a week. Medication Name: Blood Pressure Cuff Monitored regularly at home  ICD 10 O09.90 1 kit 0  . metroNIDAZOLE (FLAGYL) 500 MG tablet Take 1 tablet (500 mg total) by mouth 2 (two) times daily for 7 days. 14 tablet 0  . Prenatal Vit-Fe Fumarate-FA (PRENATAL VITAMINS PO) Take 1 tablet by mouth daily.    . promethazine (PHENERGAN) 25 MG  tablet Take 1 tablet (25 mg total) by mouth every 6 (six) hours as needed for nausea or vomiting. 30 tablet 0  . valACYclovir (VALTREX) 500 MG tablet Take 500 mg by mouth as needed.     No current facility-administered medications on file prior to visit.     Review of Systems Pertinent items noted in HPI and remainder of comprehensive ROS otherwise negative.  Exam   Vitals:   02/11/19 1341  BP: 106/65  Pulse: 90  Temp: 98 F (36.7 C)  Weight: 157 lb 1.6 oz (71.3 kg)   Fetal Heart Rate (bpm): 158  Physical Exam  Constitutional: She is oriented to person, place, and time and well-developed, well-nourished, and in no distress. No distress.  HENT:  Head: Normocephalic.  Cardiovascular: Normal rate.  Pulmonary/Chest: Effort normal.  Abdominal: Soft. There is no abdominal tenderness.  Genitourinary:    Genitourinary Comments:  External: no lesion Vagina: small amount of white discharge Cervix: pink, smooth, no CMT Uterus: AGA    Neurological: She is alert and oriented to person, place, and time.  Skin: Skin is warm and dry.  Psychiatric: Affect normal.  Nursing note and vitals reviewed.   Assessment:   Pregnancy: G1P0 Patient Active Problem List   Diagnosis Date  Noted  . Supervision of other normal pregnancy, antepartum 01/27/2019  . Substance abuse ([place]) 11/11/2015  . Adjustment disorder with mixed anxiety and depressed mood 11/11/2015  . Vaginal discharge 11/12/2012  . ECZEMA 05/27/2008  . ACNE 05/27/2008     Plan:  1. Supervision of other normal pregnancy, antepartum  - Cytology - PAP( [place]) - Obstetric Panel, Including HIV - Genetic Screening - US MFM OB COMP + 20 WK; Future   Initial labs drawn. Continue prenatal vitamins. Genetic Screening discussed, AFP and First trimester screen: requested. Ultrasound discussed; fetal anatomic survey: ordered. Problem list reviewed and updated. The nature of [place]  with multiple MDs and other Advanced Practice Providers was explained to patient; also emphasized that residents, students are part of our team. Routine obstetric precautions reviewed.  50% of 45 min visit spent in counseling and coordination of care. Return in about 5 weeks (around 03/18/2019) for Lab only visit for GC/CT and AFP only .   Marcille Buffy DNP, CNM  02/11/19  1:55 PM

## 2019-02-11 NOTE — Patient Instructions (Signed)
AREA PEDIATRIC/FAMILY PRACTICE PHYSICIANS  [place] ([address]) . [facility] o Chambliss, MD; Eniola, MD; Hale, MD; Hensel, MD; McDiarmid, MD; McIntyer, MD; Neal, MD; Walden, MD o [address] [place], [place] [address] o [phone] o Mon-Fri 8:30-12:30, 1:30-5:00 o Providers come to see babies at [facility] o Accepting Medicaid . [facility] o Limited providers who accept newborns: Koirala, MD; Morrow, MD; Wolters, MD o [address], [place], [place] [address] o [phone] o Mon-Fri 8:00-5:30 o Babies seen by providers at [facility] o Does NOT accept Medicaid o Please call early in hospitalization for appointment (limited availability)  . [facility] o [place], MD o [address] [place], [place] [address] o [phone] o Mon, Tue, Thur, Fri 8:30-5:00, Wed 10:00-7:00 (closed 1-2pm) o Babies seen by [facility] providers o Accepting Medicaid . Rubin - Pediatrician o Rubin, MD o [address], [place], [place] [address] o [phone] o Mon-Fri 8:30-5:00, Sat 8:30-12:00 o Provider comes to see babies at [facility] o Accepting Medicaid o Must have been referred from current patients or contacted office prior to delivery . [facility] ([facility]) o Brown, MD; Chandler, MD; Ettefagh, MD; Grant, MD; Lester, MD; McCormick, MD; McQueen, MD; Prose, MD; Simha, MD; Stanley, MD; Stryffeler, NP; Tebben, NP o [address], [place], [place] [address] o [phone] o Mon, Tue, Thur, Fri 8:30-5:30, Wed 9:30-5:30, Sat 8:30-12:30 o Babies seen by [facility] providers o Accepting Medicaid o Only accepting infants of first-time parents or siblings of current patients o Hospital discharge coordinator will make follow-up appointment . Jack Amos o [address],  [place], [place]  [address] o [phone]   Fax - [phone] . [facility] o [address], [place], [place]  [address] o Phone - [phone]   Fax - [phone] . Shilpa Gosrani o [address], [place], [place]  [address] o [phone]  [place]heast [place] ([address]) . [facility] o Bates, MD; [place]ass[place], MD; Cooper, Cox, MD; MD; Davis, MD; Dovico, MD; Ettefaugh, MD; Little, MD; Lowe, MD; Keiffer, MD; Melvin, MD; Sumner, MD; Williams, MD o [address], [place], [place] [address] o [phone] o Mon-Fri 8:30-5:00 (extended evenings Mon-Thur as needed), Sat-Sun 10:00-1:00 o Providers come to see babies at [facility] o Accepting Medicaid for families of first-time babies and families with all children in the household age 3 and under. Must register with office prior to making appointment (M-F only). . [facility] o Henson, NP; Knapp, MD; Lalonde, MD; Tysinger, PA o [address] [place], [place] [address] o [phone] o Mon-Fri 8:00-5:00 o Babies seen by providers at [facility] o Does NOT accept Medicaid/Commercial Insurance Only . [facility] ([facility])  o Artis, MD; Barnes, MD; Bratton, MD; Coccaro, MD; Lockett Gardner, MD; Kramer, MD; Marshall, MD; Netherton, MD; Poleto, MD; Skinner, MD o [address] [place], [place] [address] o [phone] o Mon-Fri 8:30-5:30, Sat (Oct.-Mar.) 9:00-1:00 o Babies seen by providers at [facility] o Accepting Medicaid  [place] ([address]) . [facility] o Reid, MD; Warner, MD o [address], [place],  [address] o [phone] o Mon-Fri 8:30-5:00, Sat 8:30-12:00 o Providers come to see babies at [facility] o Does NOT accept Medicaid . [facility] Ricci Barker, PA; Mannie Stabile, MD; [place], [place]; Nancy Fetter, MD; Moreen Fowler, [place],  [place], [place] [address] o [phone] o Mon-Fri 8:00-5:00 o Babies seen by providers at [facility] o Does NOT accept Medicaid o Only accepting babies of parents who are patients o Please call early in hospitalization for appointment (limited availability) . [facility] Pediatricians Blanca Friend, MD; Sharlene Motts, MD; Rod Can, MD; Warner Mccreedy, NP; Sabra Heck, MD; Ermalinda Memos, MD; Sharlett Iles, NP; Aurther Loft, MD; Jerrye Beavers, MD; Marcello Moores, MD; Berline Lopes, MD; Charolette Forward, MD o [place]. [place], [place], [place] [address] o [phone] o Mon-Fri 8:00-5:00, Sat 9:00-12:00 o Providers come to see babies at [facility] o Does NOT accept [facility] [phone]) . [facility] [place] providers accepting new patients: Dayna Ramus, NP; [place], [place], [place], [place] [address] o [phone] o Mon-Fri 8:00-5:00 o Babies seen by providers at [facility] o Does NOT accept Medicaid o Only accepting babies of parents who are patients o Please call early in hospitalization for appointment (limited availability) . [facility] Oswaldo Conroy, MD; Sheran Lawless, MD o [address] [place], [place] [address] o [phone] (press 1 to schedule appointment) o Mon-Fri 8:00-5:00 o Providers come to see babies at [facility] o Does NOT accept Medicaid . [facility] Jodi Mourning, MD o [address] [place], [place] [address] o [phone] o Mon-Fri 8:30-5:00 (lunch 12:30-1:00), extended hours by appointment only Wed 5:00-6:30 o Babies seen by [facility] providers o Accepting Medicaid . [place] at Evalyn Casco, MD; [place], MD; Ethlyn Gallery, MD o [place], [place], [place] [address] o [phone] o Mon-Fri 8:00-5:00 o Babies seen by [facility] providers o Does NOT accept Medicaid . Therapist, music at [place], MD; Yong Channel, MD; [place], [place] [address] [place], [place] [address] o  [phone] o Mon-Fri 8:00-5:00 o Babies seen by [facility] providers o Does NOT accept Medicaid . [place], [place]; [place], [place]; [place], NP; Albertina Parr, MD; Frederic Jericho, MD; Ronney Lion, MD; Carlos Levering, NP; Jerelene Redden, NP; Tomasita Crumble, NP; Ronelle Nigh, NP; Corinna Lines, MD; [place], MD o [address] [place], [place] [address] o [phone] o Mon-Fri 8:30-5:00, Sat 10:00-1:00 o Providers come to see babies at [facility] o Does NOT accept Medicaid o Free prenatal information session Tuesdays at 4:45pm . [facility] Porfirio Oar, MD; [place], [place]; [place], [place]; [place], [place]., [place] [address] o [phone] o Mon-Fri 7:30-5:30 o Babies seen by [facility] providers . [facility] Children's Doctor o [address], [place], [place], [place]  [address] o [phone]   Fax - [phone]  [place] [phone] & [phone]) . [place], MD o [address] [place], [place] [address] o [phone] o Mon-Thur 8:00-6:00 o Providers come to see babies at [place] Medicaid . [place], NP; Melford Aase, MD; [place], [place]; [place], [address] [place], [place] [address] o [phone] o Mon-Thur 7:30-7:30, Fri 7:30-4:30 o Babies seen by [facility] providers o Accepting Medicaid . [facility] Nyra Jabs, MD; Cristino Martes, NP; Gertie Baron, MD o [place] [address], [place], [place] [address] o [phone] o Mon-Fri 8:30-5:00, Sat 8:30-12:00 o Providers come to see babies at [place] Medicaid o Must have "Meet & Greet" appointment at office prior to delivery . [place] ([place]) o Hubbard,  MD; Juleen China, MD; Clydene Laming, [place] [address], [place], [place] [address] o [phone] o Mon-Wed 8:00-6:00, Thur-Fri 8:00-5:00, Sat 9:00-12:00 o Providers come to see  babies at [facility] o Does NOT accept Medicaid o Only accepting siblings of current patients . [facility], [place], [place], [place]  [address] o [phone]   Fax [phone] . [place] at [place] [address], [place], [place]  [address] o [phone]   Fax - Artois Craig [phone] & [phone]) . Therapist, music at [place], DO; [place], [address]., [place], [place] [address] o [phone] o Mon-Fri 7:00-5:00 o Babies seen by [facility] providers o Does NOT accept Medicaid . Cochrane, MD; [place], [place]; [place], [place], [place], [place] [address] o [phone] o Mon-Fri 8:00-5:00 o Babies seen by [facility] providers o Accepting Medicaid . Reed, MD; [place], [place]; [place], NP; [place], [place], [place], [place] [address] o [phone] o Mon-Fri 8:00-5:00 o Babies seen by providers at [facility] [place] [phone]) . [facility] [place], [place] o [address]., [place], [place] [address] o [phone] o Mon-Fri 8:00-5:00 o Babies seen by [facility] providers o Does NOT accept Medicaid o Limited availability, please call early in hospitalization to schedule follow-up . [facility] Leilani Merl, PA; Maisie Fus, MD; [place], MD; [place], [place]; Jeannine Kitten, MD; [place], [place]y [facility] [address], [place], [place] [address] o [phone] o Mon-Fri 8:30-5:00, Sat 9:00-12:00 o Babies seen by providers at [facility] o Accepting Medicaid o Please register online then schedule online or call office o [email or web address] . [place] [facility] [place]) Kristian Covey, NP; Dwyane Dee, MD; Leonidas Romberg, PA o [address] [place], [place], [place] [address] o [phone] o Mon-Fri 8:00-5:00 o Babies seen by providers at [facility] o Accepting Medicaid . [place] ([facility] [employer]) Dairl Ponder, MD; Rayvon Char, NP; Melina Modena, MD o [address] [place], [place], [place] [address] o [phone] o Mon-Fri 8:00-5:30, Sat&Sun by appointment (phones open at 8:30) o Babies seen by [facility] providers o Accepting Medicaid o Must be a first-time baby or sibling of current patient . [place], [address], [place], [place]  [address] o [phone]   Fax - [phone]  [place] [phone] & [phone]) . [place], [place]; [place], [place]; Benjamine Mola, MD; [place], [place]; Harrell Lark, MD o [address], [place], [place] [address] o [phone] o Mon-Thur 8:00-7:00, Fri 8:00-5:00, Sat 8:00-12:00, Sun 9:00-12:00 o Babies seen by [facility] providers o Accepting Medicaid . [facility], MD; Ruthann Cancer, MD; [facility], MD o [address], [place],  [address] o [phone] o Mon-Thur 8:00-5:00 o Babies seen by providers at [facility] o Accepting Medicaid . [facility] at [place], MD; Coe-Goins, MD; Amedeo Plenty, MD; Bobby Rumpf, MD; List, MD; Lavonia Drafts, MD; Ruthann Cancer, MD; Selinda Eon, MD; Audie Box, MD; Jim Like, MD; Christie Nottingham, MD; Hubbard Hartshorn, MD; Modena Nunnery, MD o [address] [place], [place]  [id number] o [phone] o Mon-Fri 8:00-5:30, Sat (Oct.-Mar.) 9:00-1:00 o Babies seen by providers at [facility] o Accepting Medicaid o Must fill out new patient packet, available online at [email or web address] . [facility] [address] [facility]) Simone Curiao Friddle, NP; Tiburcio PeaHarris, NP; Tresa EndoKelly, NP; Whitney PostLogan, MD; [place], [place];  Hennie DuosPoth, MD; Wynne Dustamadoss, MD; Kavin LeechStanton, NP o [address], [place], [place] [address] o [phone] o Mon-Thur 8:00-5:30, Fri 8:00-5:00 o Babies seen by providers at [facility] o Accepting [facility]  [place] [phone]([phone]) . [facility] o [place], [place]; [place], MD; Tanya NonesPickard, MD; [place]enh[place]apia, [place] o [address], [place], [place] [address] o [phone] o Mon-Fri 8:00-5:00 o Babies seen by providers at [facility] o Accepting [facility]   [place] [phone]) . [facility] o OlivetMasneri, DO; Lenise ArenaMeyers, MD; [place], [place] o [address], [place], [place] [address] o [phone] o Mon-Fri 8:00-5:00 o Babies seen by providers at [facility] o Does NOT accept Medicaid o Limited appointment availability, please call early in hospitalization  . Nature conservation officerLeBauer HealthCare at [facility] o ManterKunedd, DO; Iron JunctionMcGowen, MD o [address], [place], [place] [address] o [phone] o Mon-Fri 8:00-5:00 o Babies seen by [facility] providers o Does NOT accept Medicaid . [facility] Lorrine Kino Cameron, MD; Ninetta LightsMacDonald, MD; [place], [place]; GreshamNayak, MD o [address], [place], [place] [address] o [phone] o Mon-Fri 8:00-5:00 o After hours clinic [facility]([address] [place], [place] [address]) [phone] Mon-Fri 5:00-8:00, Sat 12:00-6:00, Sun 10:00-4:00 o Babies seen by [facility] providers o Accepting Medicaid . [facility] o [address], [place], [place]  [address] o [phone]   Fax - [phone]  [place] [phone]) . Nature conservation officerLeBauer HealthCare at [facility] o Andy, MD o [address] [place], [place], [place] [address] o [phone] o Mon-Fri 8:00-5:00 o Babies seen by [facility] providers o Does NOT accept Medicaid . [facility] [place] [facility] [place]) Tomi Likenso Eksir, MD o [address] [place] [address], [place], [place] [address] o [phone] o  Mon-Thur 8:00-7:00, Fri 8:00-5:00, Sat 8:00-12:00 o Babies seen by providers at [facility] o Accepting Medicaid - but does not have vaccinations in office (must be received elsewhere) o Limited availability, please call early in hospitalization  [place] ([address]) . [facility]  o Wyvonne Lenzharlene Flemming, MD o [address], [place] [address] o [phone]  Fax [phone]  Places to have your son circumcised:                                                                      [facility]     [phone]   [phone] while you are in hospital         [facility]              [phone]   [phone]$269 by 4 wks                      [facility]  [phone]   $269 by 7 days [facility]                    [phone]   $269 by 4 wks [facility]             [phone]   $175 by 2 wks    These prices sometimes change but are roughly what you can expect to pay. Please call and confirm pricing.   Circumcision is considered an elective/non-medically necessary procedure. There are many reasons parents decide to have their sons circumsized. During the first year of life circumcised males have a reduced risk of urinary tract infections but after this year the rates between circumcised males and uncircumcised males are the same.  It is safe to have your son circumcised outside of the hospital and the places above perform them regularly.   Deciding about Circumcision in Baby Boys  (Up-to-date The Basics)  What is circumcision?   Circumcision is a surgery that removes the skin that covers the tip of the penis, called the "foreskin" Circumcision is usually done when a boy is between 81 and 85 days old. In the [place], circumcision is common. In some other countries, fewer boys are circumcised. Circumcision is a common tradition in some religions.  Should I have my baby boy circumcised?   There is no easy answer. Circumcision has some benefits. But it also has risks. After talking with your  doctor, you will have to decide for yourself what is right for your family.  What are the benefits of circumcision?   Circumcised boys seem to have slightly lower rates of: ?Urinary tract infections ?Swelling of the opening at the tip of the penis Circumcised men seem to have slightly lower rates of: ?Urinary tract infections ?Swelling of the opening at the tip of the penis ?Penis cancer ?HIV and other infections that you catch during sex ?Cervical cancer in the women they have sex with Even so, in the [place], the risks of these problems are small - even in boys and men who have not been circumcised. Plus, boys and men who are not circumcised can reduce these extra risks by: ?Cleaning their penis well ?Using condoms during sex  What are the risks of circumcision?  Risks include: ?Bleeding or infection from the surgery ?Damage to or amputation of the penis ?A chance that the doctor will cut off too much or not enough of the foreskin ?A chance that sex won't feel as good later in life Only about 1 out of every 200 circumcisions leads to problems. There is also a chance that your health insurance won't pay for circumcision.  How is circumcision done in baby boys?  First, the baby gets medicine for pain relief. This might be a cream on the skin or a shot into the base of the penis. Next, the doctor cleans the baby's penis well. Then he or she uses special tools to cut off the foreskin. Finally, the doctor wraps a bandage (called gauze) around the baby's penis. If you have your baby circumcised, his doctor or nurse will give you instructions on how to care for him after the surgery. It is important that you follow those instructions carefully.   Contraception Choices Contraception, also called birth control, refers to methods or devices that prevent pregnancy. Hormonal methods Contraceptive implant  A contraceptive implant is a thin, plastic tube that contains a hormone. It is  inserted into the upper part of the arm. It can  remain in place for up to 3 years. Progestin-only injections Progestin-only injections are injections of progestin, a synthetic form of the hormone progesterone. They are given every 3 months by a health care provider. Birth control pills  Birth control pills are pills that contain hormones that prevent pregnancy. They must be taken once a day, preferably at the same time each day. Birth control patch  The birth control patch contains hormones that prevent pregnancy. It is placed on the skin and must be changed once a week for three weeks and removed on the fourth week. A prescription is needed to use this method of contraception. Vaginal ring  A vaginal ring contains hormones that prevent pregnancy. It is placed in the vagina for three weeks and removed on the fourth week. After that, the process is repeated with a new ring. A prescription is needed to use this method of contraception. Emergency contraceptive Emergency contraceptives prevent pregnancy after unprotected sex. They come in pill form and can be taken up to 5 days after sex. They work best the sooner they are taken after having sex. Most emergency contraceptives are available without a prescription. This method should not be used as your only form of birth control. Barrier methods Female condom  A female condom is a thin sheath that is worn over the penis during sex. Condoms keep sperm from going inside a woman's body. They can be used with a spermicide to increase their effectiveness. They should be disposed after a single use. Female condom  A female condom is a soft, loose-fitting sheath that is put into the vagina before sex. The condom keeps sperm from going inside a woman's body. They should be disposed after a single use. Diaphragm  A diaphragm is a soft, dome-shaped barrier. It is inserted into the vagina before sex, along with a spermicide. The diaphragm blocks sperm from  entering the uterus, and the spermicide kills sperm. A diaphragm should be left in the vagina for 6-8 hours after sex and removed within 24 hours. A diaphragm is prescribed and fitted by a health care provider. A diaphragm should be replaced every 1-2 years, after giving birth, after gaining more than 15 lb (6.8 kg), and after pelvic surgery. Cervical cap  A cervical cap is a round, soft latex or plastic cup that fits over the cervix. It is inserted into the vagina before sex, along with spermicide. It blocks sperm from entering the uterus. The cap should be left in place for 6-8 hours after sex and removed within 48 hours. A cervical cap must be prescribed and fitted by a health care provider. It should be replaced every 2 years. Sponge  A sponge is a soft, circular piece of polyurethane foam with spermicide on it. The sponge helps block sperm from entering the uterus, and the spermicide kills sperm. To use it, you make it wet and then insert it into the vagina. It should be inserted before sex, left in for at least 6 hours after sex, and removed and thrown away within 30 hours. Spermicides Spermicides are chemicals that kill or block sperm from entering the cervix and uterus. They can come as a cream, jelly, suppository, foam, or tablet. A spermicide should be inserted into the vagina with an applicator at least 10-15 minutes before sex to allow time for it to work. The process must be repeated every time you have sex. Spermicides do not require a prescription. Intrauterine contraception Intrauterine device (IUD) An IUD is a T-shaped device  that is put in a woman's uterus. There are two types:  Hormone IUD.This type contains progestin, a synthetic form of the hormone progesterone. This type can stay in place for 3-5 years.  Copper IUD.This type is wrapped in copper wire. It can stay in place for 10 years.  Permanent methods of contraception Female tubal ligation In this method, a woman's  fallopian tubes are sealed, tied, or blocked during surgery to prevent eggs from traveling to the uterus. Hysteroscopic sterilization In this method, a small, flexible insert is placed into each fallopian tube. The inserts cause scar tissue to form in the fallopian tubes and block them, so sperm cannot reach an egg. The procedure takes about 3 months to be effective. Another form of birth control must be used during those 3 months. Female sterilization This is a procedure to tie off the tubes that carry sperm (vasectomy). After the procedure, the man can still ejaculate fluid (semen). Natural planning methods Natural family planning In this method, a couple does not have sex on days when the woman could become pregnant. Calendar method This means keeping track of the length of each menstrual cycle, identifying the days when pregnancy can happen, and not having sex on those days. Ovulation method In this method, a couple avoids sex during ovulation. Symptothermal method This method involves not having sex during ovulation. The woman typically checks for ovulation by watching changes in her temperature and in the consistency of cervical mucus. Post-ovulation method In this method, a couple waits to have sex until after ovulation. Summary  Contraception, also called birth control, means methods or devices that prevent pregnancy.  Hormonal methods of contraception include implants, injections, pills, patches, vaginal rings, and emergency contraceptives.  Barrier methods of contraception can include female condoms, female condoms, diaphragms, cervical caps, sponges, and spermicides.  There are two types of IUDs (intrauterine devices). An IUD can be put in a woman's uterus to prevent pregnancy for 3-5 years.  Permanent sterilization can be done through a procedure for males, females, or both.  Natural family planning methods involve not having sex on days when the woman could become pregnant. This  information is not intended to replace advice given to you by your health care provider. Make sure you discuss any questions you have with your health care provider. Document Released: 07/31/2005 Document Revised: 08/02/2017 Document Reviewed: 09/02/2016 Elsevier Patient Education  2020 [employer]

## 2019-02-13 LAB — OBSTETRIC PANEL, INCLUDING HIV
Antibody Screen: NEGATIVE
Basophils Absolute: 0 10*3/uL (ref 0.0–0.2)
Basos: 0 %
EOS (ABSOLUTE): 0.1 10*3/uL (ref 0.0–0.4)
Eos: 2 %
HIV Screen 4th Generation wRfx: NONREACTIVE
Hematocrit: 31.8 % — ABNORMAL LOW (ref 34.0–46.6)
Hemoglobin: 10.7 g/dL — ABNORMAL LOW (ref 11.1–15.9)
Hepatitis B Surface Ag: NEGATIVE
Immature Grans (Abs): 0 10*3/uL (ref 0.0–0.1)
Immature Granulocytes: 0 %
Lymphocytes Absolute: 1.5 10*3/uL (ref 0.7–3.1)
Lymphs: 27 %
MCH: 30.2 pg (ref 26.6–33.0)
MCHC: 33.6 g/dL (ref 31.5–35.7)
MCV: 90 fL (ref 79–97)
Monocytes Absolute: 0.6 10*3/uL (ref 0.1–0.9)
Monocytes: 11 %
Neutrophils Absolute: 3.4 10*3/uL (ref 1.4–7.0)
Neutrophils: 60 %
Platelets: 185 10*3/uL (ref 150–450)
RBC: 3.54 x10E6/uL — ABNORMAL LOW (ref 3.77–5.28)
RDW: 14.9 % (ref 11.7–15.4)
RPR Ser Ql: NONREACTIVE
Rh Factor: POSITIVE
Rubella Antibodies, IGG: 4.5 index (ref >0.99)
WBC: 5.6 10*3/uL (ref 3.4–10.8)

## 2019-02-16 LAB — CULTURE, OB URINE

## 2019-02-16 LAB — URINE CULTURE, OB REFLEX

## 2019-02-17 LAB — CYTOLOGY - PAP: Diagnosis: NEGATIVE

## 2019-02-18 ENCOUNTER — Encounter: Payer: Self-pay | Admitting: *Deleted

## 2019-02-18 DIAGNOSIS — Z3480 Encounter for supervision of other normal pregnancy, unspecified trimester: Secondary | ICD-10-CM

## 2019-02-19 ENCOUNTER — Other Ambulatory Visit: Payer: Self-pay

## 2019-02-19 ENCOUNTER — Telehealth: Payer: Self-pay

## 2019-02-19 DIAGNOSIS — N898 Other specified noninflammatory disorders of vagina: Secondary | ICD-10-CM

## 2019-02-19 MED ORDER — TERCONAZOLE 0.4 % VA CREA: 1.0000 | TOPICAL_CREAM | Freq: Every day | VAGINAL | 0 refills | Status: DC

## 2019-02-19 NOTE — Telephone Encounter (Signed)
Called pt to advised Rx Terazol 7 was sent to pharmacy on file for her to pick up, pt verbalized understanding.

## 2019-02-25 ENCOUNTER — Other Ambulatory Visit: Payer: Self-pay

## 2019-02-25 DIAGNOSIS — Z3480 Encounter for supervision of other normal pregnancy, unspecified trimester: Secondary | ICD-10-CM

## 2019-02-25 DIAGNOSIS — D563 Thalassemia minor: Secondary | ICD-10-CM

## 2019-03-18 ENCOUNTER — Other Ambulatory Visit: Payer: Medicaid Other

## 2019-03-18 ENCOUNTER — Other Ambulatory Visit: Payer: Self-pay

## 2019-03-18 ENCOUNTER — Other Ambulatory Visit (HOSPITAL_COMMUNITY)
Admission: RE | Admit: 2019-03-18 | Discharge: 2019-03-18 | Disposition: A | Discharge status: Home / Self Care | Payer: Medicaid Other | Source: Ambulatory Visit | Attending: Family Medicine | Admitting: Family Medicine

## 2019-03-18 DIAGNOSIS — O0990 Supervision of high risk pregnancy, unspecified, unspecified trimester: Secondary | ICD-10-CM

## 2019-03-18 DIAGNOSIS — F1910 Other psychoactive substance abuse, uncomplicated: Secondary | ICD-10-CM

## 2019-03-19 LAB — GC/CHLAMYDIA PROBE AMP (~~LOC~~) NOT AT ARMC
Chlamydia: NEGATIVE
Neisseria Gonorrhea: NEGATIVE

## 2019-03-20 LAB — AFP, SERUM, OPEN SPINA BIFIDA
AFP MoM: 1.32
AFP Value: 46.1 ng/mL
Gest. Age on Collection Date: 16.2 weeks
Maternal Age At EDD: 24.7 yr
OSBR Risk 1 IN: 9058
Test Results:: NEGATIVE
Weight: 163 [lb_av]

## 2019-03-31 ENCOUNTER — Encounter (HOSPITAL_COMMUNITY): Payer: Self-pay

## 2019-03-31 ENCOUNTER — Ambulatory Visit (HOSPITAL_COMMUNITY)
Admission: EM | Admit: 2019-03-31 | Discharge: 2019-03-31 | Disposition: A | Discharge status: Home / Self Care | Payer: Medicaid Other | Attending: Family Medicine | Admitting: Family Medicine

## 2019-03-31 DIAGNOSIS — N760 Acute vaginitis: Secondary | ICD-10-CM | POA: Diagnosis not present

## 2019-03-31 MED ORDER — CLOTRIMAZOLE 1 % VA CREA: 1.0000 | TOPICAL_CREAM | Freq: Every day | VAGINAL | 0 refills | Status: DC

## 2019-03-31 NOTE — Discharge Instructions (Signed)
Begin clotrimazole vaginal cream nightly at bedtime x 1 week  Swab results should return in 3-4 days We will call if results abnormal and send in any further medicine needed  Follow up if not resolving or worsening, developing pain

## 2019-03-31 NOTE — ED Triage Notes (Signed)
Pt presents with complaints of vaginal itching and discharge x 3 days. Pt is 5 months pregnant denies any abdominal pain.

## 2019-04-01 NOTE — ED Provider Notes (Signed)
[name]    CSN: [id number] Arrival date & time: 03/31/19  1514      History   Chief Complaint Chief Complaint  Patient presents with  . Vaginitis    HPI Stacey Rodriguez is a 23 y.o. female approximately 15 weeks and 1 day pregnant, presenting today for evaluation of possible yeast infection.  Patient states that over the past couple days she has had a lot of vaginal itching.  She has had a mild amount of discharge.  States that it feels similar to previous yeast infections.  She denies any pelvic pain or abdominal pain.  Denies any rashes or lesions.  Denies any urinary symptoms of dysuria or increased frequency or urgency.  Denies any bleeding or leakage of any other fluids.  HPI  Past Medical History:  Diagnosis Date  . Genital herpes   . Hx of chlamydia infection 2017  . Hx of trichomoniasis 2017    Patient Active Problem List   Diagnosis Date Noted  . Supervision of other normal pregnancy, antepartum 01/27/2019  . Substance abuse ([place]) 11/11/2015  . Adjustment disorder with mixed anxiety and depressed mood 11/11/2015  . Vaginal discharge 11/12/2012  . ECZEMA 05/27/2008  . ACNE 05/27/2008    Past Surgical History:  Procedure Laterality Date  . NO PAST SURGERIES      OB History    Gravida  1   Para      Term      Preterm      AB      Living        SAB      TAB      Ectopic      Multiple      Live Births               Home Medications    Prior to Admission medications   Medication Sig Start Date End Date Taking? Authorizing Provider  albuterol (PROVENTIL HFA;VENTOLIN HFA) 108 (90 Base) MCG/ACT inhaler Inhale 1-2 puffs into the lungs every 6 (six) hours as needed for wheezing or shortness of breath. 12/13/17   Robyn Haber, MD  AMBULATORY NON FORMULARY MEDICATION 1 Device by Other route once a week. Medication Name: Blood Pressure Cuff Monitored regularly at home  ICD 10 O09.90 01/27/19   Luvenia Redden, PA-C   clotrimazole (GYNE-LOTRIMIN) 1 % vaginal cream Place 1 Applicatorful vaginally at bedtime. 03/31/19   Wieters, Hallie C, PA-C  hydrocortisone cream 1 % Apply 1 application topically 2 (two) times daily. 02/11/19   Tresea Mall, CNM  Prenatal Vit-Fe Fumarate-FA (PRENATAL VITAMINS PO) Take 1 tablet by mouth daily.    [provider]  promethazine (PHENERGAN) 25 MG tablet Take 1 tablet (25 mg total) by mouth every 6 (six) hours as needed for nausea or vomiting. 12/30/18   Chancy Milroy, MD  terconazole (TERAZOL 7) 0.4 % vaginal cream Place 1 applicator vaginally at bedtime. 02/19/19   Tresea Mall, CNM  valACYclovir (VALTREX) 500 MG tablet Take 500 mg by mouth as needed.    [provider]    Family History Family History  Problem Relation Age of Onset  . Asthma Mother   . Healthy Father     Social History Social History   Tobacco Use  . Smoking status: Never Smoker  . Smokeless tobacco: Never Used  Substance Use Topics  . Alcohol use: Not Currently    Comment: ocassionally  . 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   Review of Systems Review of Systems  Constitutional: Negative for fever.  Respiratory: Negative for shortness of breath.   Cardiovascular: Negative for chest pain.  Gastrointestinal: Negative for abdominal pain, diarrhea, nausea and vomiting.  Genitourinary: Positive for vaginal discharge. Negative for dysuria, flank pain, genital sores, hematuria, menstrual problem, vaginal bleeding and vaginal pain.  Musculoskeletal: Negative for back pain.  Skin: Negative for rash.  Neurological: Negative for dizziness, light-headedness and headaches.     Physical Exam Triage Vital Signs ED Triage Vitals  Enc Vitals Group     BP 03/31/19 1639 (!) 105/57     Pulse Rate 03/31/19 1639 (!) 107      Resp 03/31/19 1639 18     Temp 03/31/19 1639 99 F (37.2 C)     Temp src --      SpO2 03/31/19 1639 94 %     Weight --      Height --      Head Circumference --      Peak Flow --      Pain Score 03/31/19 1637 0     Pain Loc --      Pain Edu? --      Excl. in GC? --    No data found.  Updated Vital Signs BP (!) 105/57   Pulse (!) 107   Temp 99 F (37.2 C)   Resp 18   LMP 11/24/2018   SpO2 94%   Visual Acuity Right Eye Distance:   Left Eye Distance:   Bilateral Distance:    Right Eye Near:   Left Eye Near:    Bilateral Near:     Physical Exam Vitals signs and nursing note reviewed.  Constitutional:      Appearance: She is well-developed.     Comments: No acute distress  HENT:     Head: Normocephalic and atraumatic.     Nose: Nose normal.  Eyes:     Conjunctiva/sclera: Conjunctivae normal.  Neck:     Musculoskeletal: Neck supple.  Cardiovascular:     Rate and Rhythm: Normal rate.  Pulmonary:     Effort: Pulmonary effort is normal. No respiratory distress.  Abdominal:     General: There is no distension.     Comments: Uterus palpated to just below umbilicus.  Nontender to palpation throughout entire abdomen  Genitourinary:    Comments: Deferred Musculoskeletal: Normal range of motion.  Skin:    General: Skin is warm and dry.  Neurological:     Mental Status: She is alert and oriented to person, place, and time.      UC Treatments / Results  Labs (all labs ordered are listed, but only abnormal results are displayed) Labs Reviewed  CERVICOVAGINAL ANCILLARY ONLY    EKG   Radiology No results found.  Procedures Procedures (including critical care time)  Medications Ordered in UC Medications - No data to display  Initial Impression / Assessment and Plan / UC Course  I have reviewed the triage vital signs and the nursing notes.  Pertinent labs & imaging results that were available during my care of the patient were reviewed by me and  considered in my medical decision making (see chart for details).     Given patient pregnant, will treat with clotrimazole vaginal cream to treat  for yeast instead of Diflucan.  Swab obtained will send off to confirm as well as check for other causes of symptoms as she has had previous STDs in the past.Discussed strict return precautions. Patient verbalized understanding and is agreeable with plan.  Final Clinical Impressions(s) / UC Diagnoses   Final diagnoses:  Vaginitis and vulvovaginitis     Discharge Instructions     Begin clotrimazole vaginal cream nightly at bedtime x 1 week  Swab results should return in 3-4 days We will call if results abnormal and send in any further medicine needed  Follow up if not resolving or worsening, developing pain   ED Prescriptions    Medication Sig Dispense Auth. Provider   clotrimazole (GYNE-LOTRIMIN) 1 % vaginal cream Place 1 Applicatorful vaginally at bedtime. 45 g Wieters, MantonHallie C, PA-C     Controlled Substance Prescriptions [place] Controlled Substance Registry consulted? Not Applicable   Lew DawesWieters, Hallie C, [place]PA-C 04/01/19 [id number]

## 2019-04-02 ENCOUNTER — Telehealth (HOSPITAL_COMMUNITY): Payer: Self-pay | Admitting: Emergency Medicine

## 2019-04-02 LAB — CERVICOVAGINAL ANCILLARY ONLY
Bacterial vaginitis: POSITIVE — AB
Candida vaginitis: POSITIVE — AB
Chlamydia: NEGATIVE
Neisseria Gonorrhea: NEGATIVE
Trichomonas: NEGATIVE

## 2019-04-02 MED ORDER — METRONIDAZOLE 500 MG PO TABS: 500.0000 mg | ORAL_TABLET | Freq: Two times a day (BID) | ORAL | 0 refills | Status: AC

## 2019-04-02 NOTE — Telephone Encounter (Signed)
Candida (yeast) is positive.  Prescription for clotrimazole  was given at the urgent care visit.    Bacterial vaginosis is positive. This was not treated at the urgent care visit.  Flagyl 500 mg BID x 7 days #14 no refills sent to patients pharmacy of choice.    Patient contacted and made aware of    results, all questions answered

## 2019-04-07 ENCOUNTER — Ambulatory Visit (HOSPITAL_COMMUNITY): Payer: Medicaid Other | Admitting: *Deleted

## 2019-04-07 ENCOUNTER — Ambulatory Visit (HOSPITAL_COMMUNITY)
Admission: RE | Admit: 2019-04-07 | Discharge: 2019-04-07 | Disposition: A | Discharge status: Home / Self Care | Payer: Medicaid Other | Source: Ambulatory Visit | Attending: Obstetrics and Gynecology | Admitting: Obstetrics and Gynecology

## 2019-04-07 ENCOUNTER — Telehealth: Payer: Self-pay | Admitting: Student

## 2019-04-07 ENCOUNTER — Other Ambulatory Visit (HOSPITAL_COMMUNITY): Payer: Self-pay | Admitting: *Deleted

## 2019-04-07 ENCOUNTER — Ambulatory Visit (HOSPITAL_COMMUNITY): Payer: Self-pay | Admitting: Genetic Counselor

## 2019-04-07 ENCOUNTER — Other Ambulatory Visit: Payer: Self-pay

## 2019-04-07 ENCOUNTER — Encounter (HOSPITAL_COMMUNITY): Payer: Self-pay

## 2019-04-07 ENCOUNTER — Ambulatory Visit (HOSPITAL_BASED_OUTPATIENT_CLINIC_OR_DEPARTMENT_OTHER): Payer: Medicaid Other | Admitting: Genetic Counselor

## 2019-04-07 DIAGNOSIS — Z3A21 21 weeks gestation of pregnancy: Secondary | ICD-10-CM | POA: Diagnosis not present

## 2019-04-07 DIAGNOSIS — D573 Sickle-cell trait: Secondary | ICD-10-CM

## 2019-04-07 DIAGNOSIS — Z148 Genetic carrier of other disease: Secondary | ICD-10-CM | POA: Diagnosis not present

## 2019-04-07 DIAGNOSIS — D563 Thalassemia minor: Secondary | ICD-10-CM

## 2019-04-07 DIAGNOSIS — Z3480 Encounter for supervision of other normal pregnancy, unspecified trimester: Secondary | ICD-10-CM | POA: Diagnosis not present

## 2019-04-07 DIAGNOSIS — Z362 Encounter for other antenatal screening follow-up: Secondary | ICD-10-CM

## 2019-04-07 DIAGNOSIS — Z315 Encounter for genetic counseling: Secondary | ICD-10-CM | POA: Diagnosis not present

## 2019-04-07 DIAGNOSIS — Z363 Encounter for antenatal screening for malformations: Secondary | ICD-10-CM

## 2019-04-07 NOTE — Progress Notes (Signed)
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 Stacey Rodriguez came to her appointment alone due to COVID-19 visitor restrictions.   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 female. Her current pregnancy has completed [redacted]w[redacted]d(Estimated Date of Delivery: 08/13/19).  Stacey Rodriguez denied exposure to environmental toxins or chemical agents. She denied the use of alcohol, tobacco or street drugs. She denied significant viral illnesses, fevers, and bleeding during the course of her pregnancy. Her medical and surgical histories were noncontributory.  Family History  A three generation pedigree was drafted and reviewed. The family history is remarkable for the following:  - Stacey Rodriguez has a maternal half brother with sickle cell disease. Stacey Rodriguez was aware of the fact that her mother is also a carrier of sickle cell disease. We discussed that her sickle cell carrier screening results make sense in light of this information, as she had a 50% chance of inheriting sickle cell trait from her mother and also being a carrier herself. See Discussion section for more details.  The remaining family histories were reviewed and found to be noncontributory for birth defects, intellectual disability, recurrent pregnancy loss, and known genetic conditions.    The patient's ethnicity is African American. The father of the pregnancy's ethnicity is African American. Ashkenazi Jewish ancestry and consanguinity were denied. Pedigree will be scanned under Media.  Discussion  Stacey Rodriguez had Horizon 14 carrier screening performed through [facility] The results of the  screen identified her as a silent carrier for alpha-thalassemia (aa/a-). Alpha-thalassemia is different in its inheritance compared to other hemoglobinopathies as there are two alpha globin genes on each chromosome 16, or four alpha globin genes total (aa/aa). A person can be a carrier of one alpha gene mutation (aa/a-), also referred to as a "silent carrier". A person who carries two alpha globin gene mutations can either carry them in cis (both on the same chromosome, denoted as aa/--) or in trans (on different chromosomes, denoted as a-/a-). Alpha-thalassemia carriers of two mutations who have African American ancestry are more likely to have a trans arrangement (a-/a-); cis configuration is reported to be rare in individuals with African American ancestry.     There are several different forms of alpha-thalassemia. The most severe form of alpha-thalassemia, Hb Barts, is associated with an absence of alpha globin chain synthesis as a result of deletions of all four alpha globin genes (--/--).  Given that Stacey Rodriguez is a silent carrier (aa/a-), her pregnancies would not be at increased risk for Hb Barts, even if her partner is a carrier for alpha-thalassemia. Hemoglobin H (HbH) disease is caused by three deleted or dysfunctioning alpha globin alleles (a-/--) and is characterized by microcytic hypochromic hemolytic anemia, hepatosplenomegaly, mild jaundice, and sometimes thalassemia-like bone changes. Given Stacey Rodriguez's silent carrier status (aa/a-), the fetus would only be at risk for HbH disease (a-/--), if her partner is a carrier for two alpha globin mutations in cis (aa/--). If this is the case, the risk for HbH disease in the pregnancy would be 1 in 4 (25%). However, if Stacey Rodriguez's partner is a carrier for two alpha globin mutations, he would be more likely to carry them in trans configuration (a-/a-) than the cis configuration (aa/--),  given his ethnicity. If he is a carrier of  alpha-thalassemia in trans, then the pregnancy would not be at increased risk for HbH disease. Based on the carrier frequency for alpha-thalassemia in the African American population, Stacey Rodriguez's partner has a 1 in 30 chance of being any type of carrier for alpha-thalassemia.   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 We discussed that SCA is one condition in a group of blood disorders that affect hemoglobin in red blood cells (hemoglobinopathies). Hemoglobin is a protein that transports oxygen from the lungs to organs and tissues throughout the body. Individuals with SCA have an inherited structural abnormality in hemoglobin's beta globin chains due to a single amino acid change in the HBB gene. Instead of producing normal adult hemoglobin (Hgb A), individuals with SCA produce an atypical form of hemoglobin called hemoglobin S (Hgb S). Typically, individuals are expected to have two copies of Hgb A (Hgb AA). Individuals who are carriers of SCA have one copy of Hgb A and one copy of Hgb S (Hgb AS), whereas individuals affected by SCA have two copies of Hgb S (Hgb SS). Carriers of SCA are often said to have sickle cell "trait".  Hgb S alters the configuration of the hemoglobin molecule. As a result, individuals with SCA have red blood cells that can sickle and obstruct blood flow in small blood vessels, causing ischemia of tissues and organs and episodes of vaso-occlusive crisis. The amino acid change in the HBB gene also causes red blood cells to become fragile and break down easily, which results in chronic anemia. Additional complications associated with SCA may include organ damage, frequent infections, acute chest syndrome, ischemic stroke, splenic sequestration, priapism, and pulmonary hypertension. SCA is inherited in an autosomal recessive pattern, where both parents must  carry Hgb S trait to be at risk of having an affected child. If Stacey Rodriguez's partner were also a carrier of SCA, they would have a 1 in 4 (25%) chance of having a child with SCA.   Hgb S is just one variant form of hemoglobin caused by a mutations in the HBB gene. It is also possible that Stacey Rodriguez's partner could carry another variant form of hemoglobin, such as hemoglobin C. If he did, the couple would have a 1 in 4 (25%) chance of having a child with hemoglobin [place] disease (HbSC disease). Individuals with HbSC disease have red blood cells that contain both hemoglobin S and hemoglobin C. These variant forms of hemoglobin can cause red blood cells to become rigid sickle, blocking small blood vessels and making it difficult for the red blood cells to deliver oxygen to the body's tissues. This can cause severe pain and organ damage, just as we see in individuals with sickle cell disease. Individuals with HbSC disease are at risk of the same complications as those associated with sickle cell disease, such as pain crises, acute chest syndrome, infections, asplenia, and strokes; however, these complications may occur at a lesser frequency.  Finally, Stacey Rodriguez's partner may have a different variant in the HBB gene that could make a carrier of beta-thalassemia. If he did, the couple would have a 1 in 4 (25%) chance of having a child with sickle beta thalassemia. The severity of sickle beta thalassemia depends on the normal amount of beta globin that is produced. If an individual produces no beta globin (sickle beta zero thalassemia), they will experience  symptoms similar to SCA. If an individual produces a reduced amount of beta globin (sickle beta plus thalassemia), they may experience symptoms that are similar to a milder form of SCA.  Given his ethnicity, Stacey Rodriguez's partner has a 1 in 19 chance of carrying hemoglobin S trait, a 1 in 38 chance at carrying hemoglobin C trait, and a 1  in 75 chance of carrying beta-thalassemia. We discussed that carrier testingfor alpha-thalassemia and HBB-related disorders is recommended forMs. Rodriguez's partner to refine the risks for the current pregnancy to be affected with alpha-thalassemia, sickle cell disease, HbC disease, or sickle beta thalassemia. Stacey Rodriguez indicated that she is interested in pursuing carrier screening for alpha-thalassemia and HBB-related conditions for her partner.  Stacey Rodriguez's carrier screening was negative for the other 12 conditions screened. Thus, her risk to be a carrier for these additional conditions (listed separately in the laboratory report) has been reduced but not eliminated.  We also reviewed that Stacey Rodriguez had [facility] NIPS through [facility] that was low-risk for fetal aneuploidies. We reviewed that these results showed a less than 1 in 10,000 risk for trisomies 21, 18 and 13, and monosomy X (Turner syndrome).  In addition, the risk for triploidy and sex chromosome trisomies (47,XXX and 47,XXY) was also low. Stacey Rodriguez elected to have cffDNA analysis for 22q11 deletion syndrome, which was also low risk (1 in 9000). We reviewed that while this testing identifies > 99% of pregnancies with trisomy 66, trisomy 57, sex chromosome trisomies (47,XXX and 47,XXY), and triploidy, it is NOT diagnostic. A positive test result requires confirmation by CVS or amniocentesis, and a negative test result does not rule out a fetal chromosome abnormality. She also understands that this testing does not identify all genetic conditions.  A complete ultrasound was performed today following our visit. The ultrasound report will be sent under separate cover. There were no visualized fetal anomalies or markers suggestive of aneuploidy.   Stacey Rodriguez was also counseled regarding diagnostic testing via amniocentesis. We discussed the technical aspects of the procedure and quoted up to a 1 in 500  (0.2%) risk for spontaneous pregnancy loss or other adverse pregnancy outcomes as a result of amniocentesis. Cultured cells from an amniocentesis sample allow for the visualization of a fetal karyotype, which can detect >99% of chromosomal aberrations. Chromosomal microarray can also be performed to identify smaller deletions or duplications of fetal chromosomal material. Amniocentesis could also be performed to assess whether the baby is affected by alpha-thalassemia or a hemoglobinopathy. After careful consideration, Stacey Rodriguez declined amniocentesis at this time. She understands that amniocentesis is available at any point after 16 weeks of pregnancy and that she may opt to undergo the procedure at a later date should she change her mind.  Lastly, we reviewed results from Ms. Reed-Cumming's MS-AFP screening for open neural tube defects (ONTDs). In addition to this negative result, Stacey Rodriguez's level II ultrasound today did not detect any ONTDs. Both ultrasound and blood screening are able to detect ONTDs with 90-95% sensitivity; thus, the chances of this fetus being affected by an ONTD are significantly reduced. However, normal results from any of the above options do not guarantee a normal baby, as 3-5% of newborns have some type of birth defect, many of which are not prenatally diagnosable.  Stacey Rodriguez indicated that she is interested in partner carrier screening for alpha-thalassemia and HBB-related hemoglobinopathies; however, she wanted to talk to him first to determine whether he would prefer getting his blood drawn or  having a saliva kit mailed to his house. Stacey Rodriguez was provided with my business card and instructed to call me when her and her partner had made a decision about testing. From there, I will coordinate sample collection and facilitate the testing process.  I counseled Stacey Rodriguez regarding the above risks and available options. The approximate  face-to-face time with the genetic counselor was 25 minutes.  In summary:  Discussed carrier screening results and options for follow-up testing ? 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 Desires partner carrier screening. Will call me to discuss whether her partner prefers to undergo a blood draw or have a saliva kit mailed to his home  Reviewednormal results of NIPS ? Reduction in risk for Down syndrome, trisomy 86, trisomy 68, sex chromosome aneuploidies, and 22q11.2 deletion syndrome  Reviewedresults of ultrasound ? No fetal anomalies or markers seen ? Reduction in risk for fetal aneuploidy  Offered additional testing and screening ? Declined amniocentesis  Reviewed family history concerns   Buelah Manis, MS Genetic Counselor

## 2019-04-07 NOTE — Telephone Encounter (Signed)
Spoke with patient about her appointment on 8/25 @ 8:55. Patient instructed that the appointment is a mychart visit. Patient instructed to download the mychart app if not already done so. Patient verbalized she will download the app before her appointment.

## 2019-04-08 ENCOUNTER — Telehealth (INDEPENDENT_AMBULATORY_CARE_PROVIDER_SITE_OTHER): Payer: Medicaid Other | Admitting: Student

## 2019-04-08 DIAGNOSIS — Z3A21 21 weeks gestation of pregnancy: Secondary | ICD-10-CM | POA: Diagnosis not present

## 2019-04-08 DIAGNOSIS — Z3482 Encounter for supervision of other normal pregnancy, second trimester: Secondary | ICD-10-CM | POA: Diagnosis not present

## 2019-04-08 DIAGNOSIS — Z3480 Encounter for supervision of other normal pregnancy, unspecified trimester: Secondary | ICD-10-CM

## 2019-04-08 MED ORDER — VALACYCLOVIR HCL 500 MG PO TABS: 500.0000 mg | ORAL_TABLET | Freq: Two times a day (BID) | ORAL | 5 refills | Status: DC

## 2019-04-08 NOTE — Progress Notes (Signed)
Called pt @ [phone] for scheduled virtual visit. She did not answer and message was left that I would call back in a few minutes. Called and spoke w/pt @ 0920. She stated that she has not been checking her BP once weekly because she has been working a lot. She is currently @ work and is not able to check her BP @ present time.

## 2019-04-08 NOTE — Progress Notes (Signed)
Patient ID: Stacey Rodriguez, female   DOB: September 01, 1995, 23 y.o.   MRN: [id number]     TELEHEALTH VIRTUAL OBSTETRICS VISIT ENCOUNTER NOTE  I connected with Stacey Rodriguez on 04/08/19 at  8:55 AM EDT by telephone at home and verified that I am speaking with the correct person using two identifiers.  Unable to connect using myChart as patient was at work and did not have her phone with her.  I discussed the limitations, risks, security and privacy concerns of performing an evaluation and management service by telephone and the availability of in person appointments. I also discussed with the patient that there may be a patient responsible charge related to this service. The patient expressed understanding and agreed to proceed.  Subjective:  Stacey Rodriguez is a 23 y.o. G1P0 at [redacted]w[redacted]d being followed for ongoing prenatal care.  She is currently monitored for the following issues for this low-risk pregnancy and has ECZEMA; ACNE; Vaginal discharge; Substance abuse ([place]); Adjustment disorder with mixed anxiety and depressed mood; and Supervision of other normal pregnancy, antepartum on their problem list.  Patient reports no complaints. She has not been taking her BP; she has BP cuff and app but is too busy to take it. Reports fetal movement. Denies any contractions, bleeding or leaking of fluid.   The following portions of the patient's history were reviewed and updated as appropriate: allergies, current medications, past family history, past medical history, past social history, past surgical history and problem list.   Objective:   General:  Alert, oriented and cooperative.   Mental Status: Normal mood and affect perceived. Normal judgment and thought content.  Rest of physical exam deferred due to type of encounter  Assessment and Plan:  Pregnancy: G1P0 at [redacted]w[redacted]d 1. Supervision of other normal pregnancy, antepartum  -Reviewed BP warning signs -Emphasized how to take BP and  importance of taking BP. She has BabyRx app and knows how to use it.  Discussed visit at 28 weeks and protocol.  -discussed BC, patient wants to go back on Depo or nexplanon.  -Gave refill on valtex, las outbreak was "a few years ago" but she wants some on hand.   Preterm labor symptoms and general obstetric precautions including but not limited to vaginal bleeding, contractions, leaking of fluid and fetal movement were reviewed in detail with the patient.  I discussed the assessment and treatment plan with the patient. The patient was provided an opportunity to ask questions and all were answered. The patient agreed with the plan and demonstrated an understanding of the instructions. The patient was advised to call back or seek an in-person office evaluation/go to [facility] at [facility] for any urgent or concerning symptoms. Please refer to After Visit Summary for other counseling recommendations.   I provided 15 minutes of non-face-to-face time during this encounter.  Return in about 8 weeks (around 06/03/2019), or LROB in person with 28 week labs.  Future Appointments  Date Time Provider [place]  05/05/2019  9:00 AM WH-MFC [place] 3 WH-MFCUS MFC-US  06/03/2019  8:15 AM Jorje Guild, NP [facility] WOC  06/03/2019  8:50 AM WOC-WOCA LAB [place], [place] for [employer] [place] [facility]

## 2019-04-08 NOTE — Patient Instructions (Addendum)
-take BP once a week -Connect it to [employer]; as a back-up, write the number down in your phone on your calednar to hvace a record in case BabyScripts doesn't work -If blood pressure is higher than 140/90 (either number); rest, drink water, and re-take. If its still high, come the [facility] for evaluation.  Glucose Tolerance Test During Pregnancy Why am I having this test? The glucose tolerance test (GTT) is done to check how your body processes sugar (glucose). This is one of several tests used to diagnose diabetes that develops during pregnancy (gestational diabetes mellitus). Gestational diabetes is a temporary form of diabetes that some women develop during pregnancy. It usually occurs during the second trimester of pregnancy and goes away after delivery. Testing (screening) for gestational diabetes usually occurs between 24 and 28 weeks of pregnancy. You may have the GTT test after having a 1-hour glucose screening test if the results from that test indicate that you may have gestational diabetes. You may also have this test if:  You have a history of gestational diabetes.  You have a history of giving birth to very large babies or have experienced repeated fetal loss (stillbirth).  You have signs and symptoms of diabetes, such as: ? Changes in your vision. ? Tingling or numbness in your hands or feet. ? Changes in hunger, thirst, and urination that are not otherwise explained by your pregnancy. What is being tested? This test measures the amount of glucose in your blood at different times during a period of 3 hours. This indicates how well your body is able to process glucose. What kind of sample is taken?  Blood samples are required for this test. They are usually collected by inserting a needle into a blood vessel. How do I prepare for this test?  For 3 days before your test, eat normally. Have plenty of carbohydrate-rich foods.  Follow instructions from  your health care provider about: ? Eating or drinking restrictions on the day of the test. You may be asked to not eat or drink anything other than water (fast) starting 8-10 hours before the test. ? Changing or stopping your regular medicines. Some medicines may interfere with this test. Tell a health care provider about:  All medicines you are taking, including vitamins, herbs, eye drops, creams, and over-the-counter medicines.  Any blood disorders you have.  Any surgeries you have had.  Any medical conditions you have. What happens during the test? First, your blood glucose will be measured. This is referred to as your fasting blood glucose, since you fasted before the test. Then, you will drink a glucose solution that contains a certain amount of glucose. Your blood glucose will be measured again 1, 2, and 3 hours after drinking the solution. This test takes about 3 hours to complete. You will need to stay at the testing location during this time. During the testing period:  Do not eat or drink anything other than the glucose solution.  Do not exercise.  Do not use any products that contain nicotine or tobacco, such as cigarettes and e-cigarettes. If you need help stopping, ask your health care provider. The testing procedure may vary among health care providers and hospitals. How are the results reported? Your results will be reported as milligrams of glucose per deciliter of blood (mg/dL) or millimoles per liter (mmol/L). Your health care provider will compare your results to normal ranges that were established after testing a large group of people (reference ranges). Reference  ranges may vary among labs and hospitals. For this test, common reference ranges are:  Fasting: less than 95-105 mg/dL (1.6-1.05.3-5.8 mmol/L).  1 hour after drinking glucose: less than 180-190 mg/dL (96.0-45.410.0-10.5 mmol/L).  2 hours after drinking glucose: less than 155-165 mg/dL (0.9-8.18.6-9.2 mmol/L).  3 hours after  drinking glucose: 140-145 mg/dL (1.9-1.47.8-8.1 mmol/L). What do the results mean? Results within reference ranges are considered normal, meaning that your glucose levels are well-controlled. If two or more of your blood glucose levels are high, you may be diagnosed with gestational diabetes. If only one level is high, your health care provider may suggest repeat testing or other tests to confirm a diagnosis. Talk with your health care provider about what your results mean. Questions to ask your health care provider Ask your health care provider, or the department that is doing the test:  When will my results be ready?  How will I get my results?  What are my treatment options?  What other tests do I need?  What are my next steps? Summary  The glucose tolerance test (GTT) is one of several tests used to diagnose diabetes that develops during pregnancy (gestational diabetes mellitus). Gestational diabetes is a temporary form of diabetes that some women develop during pregnancy.  You may have the GTT test after having a 1-hour glucose screening test if the results from that test indicate that you may have gestational diabetes. You may also have this test if you have any symptoms or risk factors for gestational diabetes.  Talk with your health care provider about what your results mean. This information is not intended to replace advice given to you by your health care provider. Make sure you discuss any questions you have with your health care provider. Document Released: 01/30/2012 Document Revised: 11/21/2018 Document Reviewed: 03/12/2017 Elsevier Patient Education  2020 [employer]

## 2019-04-15 ENCOUNTER — Telehealth: Payer: Self-pay | Admitting: General Practice
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
## 2019-04-16 ENCOUNTER — Other Ambulatory Visit: Payer: Self-pay

## 2019-04-16 ENCOUNTER — Telehealth (INDEPENDENT_AMBULATORY_CARE_PROVIDER_SITE_OTHER): Payer: Medicaid Other | Admitting: Nurse Practitioner

## 2019-04-16 DIAGNOSIS — Z3480 Encounter for supervision of other normal pregnancy, unspecified trimester: Secondary | ICD-10-CM

## 2019-04-16 DIAGNOSIS — Z3482 Encounter for supervision of other normal pregnancy, second trimester: Secondary | ICD-10-CM

## 2019-04-16 DIAGNOSIS — Z3A23 23 weeks gestation of pregnancy: Secondary | ICD-10-CM

## 2019-04-16 NOTE — Progress Notes (Signed)
I connected with  Stacey Rodriguez on 04/16/19 at  2:35 PM EDT by telephone and verified that I am speaking with the correct person using two identifiers.   I discussed the limitations, risks, security and privacy concerns of performing an evaluation and management service by telephone and the availability of in person appointments. I also discussed with the patient that there may be a patient responsible charge related to this service. The patient expressed understanding and agreed to proceed.  [place], CMA 04/16/2019  2:36 PM   Unable to get to her cuff today. Will add blood pressure in bbyrx later
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
## 2019-05-05 ENCOUNTER — Other Ambulatory Visit: Payer: Self-pay

## 2019-05-05 ENCOUNTER — Ambulatory Visit (HOSPITAL_COMMUNITY)
Admission: RE | Admit: 2019-05-05 | Discharge: 2019-05-05 | Disposition: A | Discharge status: Home / Self Care | Payer: Medicaid Other | Source: Ambulatory Visit | Attending: Obstetrics and Gynecology | Admitting: Obstetrics and Gynecology

## 2019-05-05 DIAGNOSIS — Z3A25 25 weeks gestation of pregnancy: Secondary | ICD-10-CM | POA: Diagnosis not present

## 2019-05-05 DIAGNOSIS — Z148 Genetic carrier of other disease: Secondary | ICD-10-CM

## 2019-05-05 DIAGNOSIS — Z362 Encounter for other antenatal screening follow-up: Secondary | ICD-10-CM | POA: Insufficient documentation

## 2019-05-14 ENCOUNTER — Telehealth: Payer: Self-pay | Admitting: Obstetrics and Gynecology

## 2019-05-14 ENCOUNTER — Other Ambulatory Visit: Payer: Self-pay | Admitting: *Deleted

## 2019-05-14 DIAGNOSIS — Z3480 Encounter for supervision of other normal pregnancy, unspecified trimester: Secondary | ICD-10-CM

## 2019-05-14 DIAGNOSIS — F4323 Adjustment disorder with mixed anxiety and depressed mood: Secondary | ICD-10-CM

## 2019-05-14 NOTE — Telephone Encounter (Signed)
Spoke to patient about her appointment on 10/1 @ 8:20. Patient instructed to wear a face mask for the entire appointment and no visitors are allowed with her during the visit. Patient screened for covid symptoms and denied having any

## 2019-05-15 ENCOUNTER — Ambulatory Visit (INDEPENDENT_AMBULATORY_CARE_PROVIDER_SITE_OTHER): Payer: Medicaid Other | Admitting: Obstetrics and Gynecology

## 2019-05-15 ENCOUNTER — Other Ambulatory Visit: Payer: Self-pay

## 2019-05-15 ENCOUNTER — Other Ambulatory Visit: Payer: Medicaid Other

## 2019-05-15 DIAGNOSIS — Z3A27 27 weeks gestation of pregnancy: Secondary | ICD-10-CM

## 2019-05-15 DIAGNOSIS — D563 Thalassemia minor: Secondary | ICD-10-CM | POA: Insufficient documentation

## 2019-05-15 DIAGNOSIS — A6009 Herpesviral infection of other urogenital tract: Secondary | ICD-10-CM | POA: Insufficient documentation

## 2019-05-15 DIAGNOSIS — Z3480 Encounter for supervision of other normal pregnancy, unspecified trimester: Secondary | ICD-10-CM

## 2019-05-15 DIAGNOSIS — O98319 Other infections with a predominantly sexual mode of transmission complicating pregnancy, unspecified trimester: Secondary | ICD-10-CM | POA: Insufficient documentation

## 2019-05-15 DIAGNOSIS — O98312 Other infections with a predominantly sexual mode of transmission complicating pregnancy, second trimester: Secondary | ICD-10-CM

## 2019-05-15 DIAGNOSIS — D573 Sickle-cell trait: Secondary | ICD-10-CM | POA: Diagnosis not present

## 2019-05-15 DIAGNOSIS — F4323 Adjustment disorder with mixed anxiety and depressed mood: Secondary | ICD-10-CM | POA: Diagnosis not present

## 2019-05-15 NOTE — Progress Notes (Signed)
   PRENATAL VISIT NOTE  Subjective:  Stacey Rodriguez is a 23 y.o. G1P0 at [redacted]w[redacted]d being seen today for ongoing prenatal care.  She is currently monitored for the following issues for this low-risk pregnancy and has ECZEMA; ACNE; Vaginal discharge; Substance abuse ([place]); Adjustment disorder with mixed anxiety and depressed mood; Supervision of other normal pregnancy, antepartum; Alpha thalassemia silent carrier; Sickle cell trait ([place]); and Genital herpes affecting pregnancy on their problem list.  Patient reports no complaints.  Contractions: Not present. Vag. Bleeding: None.  Movement: Present. Denies leaking of fluid.   The following portions of the patient's history were reviewed and updated as appropriate: allergies, current medications, past family history, past medical history, past social history, past surgical history and problem list.   Objective:   Vitals:   05/15/19 0857  BP: 106/62  Pulse: 87  Weight: 174 lb (78.9 kg)    Fetal Status: Fetal Heart Rate (bpm): 155   Movement: Present     General:  Alert, oriented and cooperative. Patient is in no acute distress.  Skin: Skin is warm and dry. No rash noted.   Cardiovascular: Normal heart rate noted  Respiratory: Normal respiratory effort, no problems with respiration noted  Abdomen: Soft, gravid, appropriate for gestational age.  Pain/Pressure: Present     Pelvic: Cervical exam deferred        Extremities: Normal range of motion.  Edema: None  Mental Status: Normal mood and affect. Normal behavior. Normal judgment and thought content.   Assessment and Plan:  Pregnancy: G1P0 at [redacted]w[redacted]d  1. Alpha thalassemia silent carrier  Declines genetic testing   2. Sickle cell trait ([place])  Urine culture Q trimester; culture today.   3. Genital herpes affecting pregnancy, antepartum  Has RX for Valtrex. Will start at 36 weeks.    There are no diagnoses linked to this encounter. Preterm labor symptoms and general obstetric  precautions including but not limited to vaginal bleeding, contractions, leaking of fluid and fetal movement were reviewed in detail with the patient. Please refer to After Visit Summary for other counseling recommendations.   No follow-ups on file.  Future Appointments  Date Time Provider [place]  06/03/2019  8:15 AM Jorje Guild, NP [place], NP

## 2019-05-16 LAB — RPR: RPR Ser Ql: NONREACTIVE

## 2019-05-16 LAB — GLUCOSE TOLERANCE, 2 HOURS W/ 1HR
Glucose, 1 hour: 88 mg/dL (ref 65–179)
Glucose, 2 hour: 78 mg/dL (ref 65–152)
Glucose, Fasting: 84 mg/dL (ref 65–91)

## 2019-05-16 LAB — CBC
Hematocrit: 33 % — ABNORMAL LOW (ref 34.0–46.6)
Hemoglobin: 10.6 g/dL — ABNORMAL LOW (ref 11.1–15.9)
MCH: 28.9 pg (ref 26.6–33.0)
MCHC: 32.1 g/dL (ref 31.5–35.7)
MCV: 90 fL (ref 79–97)
Platelets: 206 10*3/uL (ref 150–450)
RBC: 3.67 x10E6/uL — ABNORMAL LOW (ref 3.77–5.28)
RDW: 13.1 % (ref 11.7–15.4)
WBC: 6.1 10*3/uL (ref 3.4–10.8)

## 2019-05-16 LAB — HIV ANTIBODY (ROUTINE TESTING W REFLEX): HIV Screen 4th Generation wRfx: NONREACTIVE

## 2019-05-17 LAB — URINE CULTURE

## 2019-05-25 ENCOUNTER — Inpatient Hospital Stay
Admission: RE | Admit: 2019-05-25 | Discharge: 2019-05-25 | Disposition: A | Discharge status: Home / Self Care | Payer: Medicaid Other | Source: Ambulatory Visit

## 2019-05-28 ENCOUNTER — Other Ambulatory Visit: Payer: Self-pay

## 2019-05-28 ENCOUNTER — Inpatient Hospital Stay
Admission: RE | Admit: 2019-05-28 | Discharge: 2019-05-28 | Disposition: A | Discharge status: Home / Self Care | Payer: Medicaid Other | Source: Ambulatory Visit

## 2019-05-28 ENCOUNTER — Ambulatory Visit (INDEPENDENT_AMBULATORY_CARE_PROVIDER_SITE_OTHER)
Admission: RE | Admit: 2019-05-28 | Discharge: 2019-05-28 | Disposition: A | Discharge status: Home / Self Care | Payer: Medicaid Other | Source: Ambulatory Visit

## 2019-05-28 DIAGNOSIS — Z3A00 Weeks of gestation of pregnancy not specified: Secondary | ICD-10-CM

## 2019-05-28 DIAGNOSIS — Z0289 Encounter for other administrative examinations: Secondary | ICD-10-CM | POA: Diagnosis not present

## 2019-05-28 DIAGNOSIS — Z3490 Encounter for supervision of normal pregnancy, unspecified, unspecified trimester: Secondary | ICD-10-CM

## 2019-05-28 NOTE — ED Provider Notes (Signed)
Virtual Visit via Video Note:  Stacey Rodriguez  initiated request for Telemedicine visit with [facility] team. I connected with Stacey Rodriguez  on 05/28/2019 at 1:06 PM  for a synchronized telemedicine visit using a video enabled HIPPA compliant telemedicine application. I verified that I am speaking with Stacey Rodriguez  using two identifiers. Stacey Eagles, Stacey Rodriguez  was physically located in a [facility] Urgent care site and Stacey Rodriguez was located at a different location.   The limitations of evaluation and management by telemedicine as well as the availability of in-person appointments were discussed. Patient was informed that she  may incur a bill ( including co-pay) for this virtual visit encounter. Stacey Rodriguez  expressed understanding and gave verbal consent to proceed with virtual visit.   History of Present Illness:Stacey Rodriguez  is a 23 y.o. female for clearance to do her job. 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  She is being required by her employer to ask if she can safely do her job.  Does not have to lift heavy items, does not have to do prolonged standing. Generally changes sheets for rooms. Denies hx of edema, HTN.  Reports that she feels completely fine at work.   ROS  Current Outpatient Medications  Medication Sig Dispense Refill  . albuterol (PROVENTIL HFA;VENTOLIN HFA) 108 (90 Base) MCG/ACT inhaler Inhale 1-2 puffs into the lungs every 6 (six) hours as needed for wheezing or shortness of breath. (Patient not taking: Reported on 04/07/2019) 1 Inhaler 11  . AMBULATORY NON FORMULARY MEDICATION 1 Device by Other route once a week. Medication Name: Blood Pressure Cuff Monitored regularly at home  ICD 10 O09.90 1 kit 0  . clotrimazole (GYNE-LOTRIMIN) 1 % vaginal cream Place 1 Applicatorful vaginally at bedtime. 45 g 0  . hydrocortisone cream 1 % Apply 1 application topically 2 (two) times daily. (Patient not taking: Reported on  05/15/2019) 30 g 0  . Prenatal Vit-Fe Fumarate-FA (PRENATAL VITAMINS PO) Take 1 tablet by mouth daily.    . promethazine (PHENERGAN) 25 MG tablet Take 1 tablet (25 mg total) by mouth every 6 (six) hours as needed for nausea or vomiting. (Patient not taking: Reported on 04/07/2019) 30 tablet 0  . valACYclovir (VALTREX) 500 MG tablet Take 1 tablet (500 mg total) by mouth 2 (two) times daily. (Patient not taking: Reported on 04/16/2019) 14 tablet 5   No current facility-administered medications for this encounter.      No Known Allergies   Past Medical History:  Diagnosis Date  . Genital herpes   . Hx of chlamydia infection 2017  . Hx of trichomoniasis 2017    Past Surgical History:  Procedure Laterality Date  . NO PAST SURGERIES        Observations/Objective: Physical Exam Patient unable to access video visit, done through telephone only.  Assessment and Plan:  1. Pregnancy, unspecified gestational age    Patient has no difficulty with her blood pressure, chronic conditions in pregnancy.  At her last office visit on 05/15/2019, there were no particular concerns with her OB.  We will write a letter for patient stating that she can resume her work tomorrow without restriction.   Follow Up Instructions:    I discussed the assessment and treatment plan with the patient. The patient was provided an opportunity to ask questions and all were answered. The patient agreed with the plan and demonstrated an understanding of the instructions.   The patient was advised to call back  or seek an in-person evaluation if the symptoms worsen or if the condition fails to improve as anticipated.  I provided 15 minutes of non-face-to-face time during this encounter.    Stacey Eagles, Stacey Rodriguez  05/28/2019 1:06 PM         Stacey Eagles, Stacey Rodriguez 05/28/19 1322

## 2019-06-03 ENCOUNTER — Encounter: Payer: Self-pay | Admitting: Student

## 2019-06-03 ENCOUNTER — Other Ambulatory Visit: Payer: Medicaid Other | Admitting: Student

## 2019-06-03 ENCOUNTER — Other Ambulatory Visit: Payer: Self-pay

## 2019-06-03 ENCOUNTER — Ambulatory Visit (INDEPENDENT_AMBULATORY_CARE_PROVIDER_SITE_OTHER): Payer: Medicaid Other | Admitting: Student

## 2019-06-03 ENCOUNTER — Other Ambulatory Visit: Payer: Medicaid Other

## 2019-06-03 VITALS — BP 113/68 | HR 91 | Wt 182.4 lb

## 2019-06-03 DIAGNOSIS — Z3480 Encounter for supervision of other normal pregnancy, unspecified trimester: Secondary | ICD-10-CM

## 2019-06-03 DIAGNOSIS — Z3482 Encounter for supervision of other normal pregnancy, second trimester: Secondary | ICD-10-CM

## 2019-06-03 DIAGNOSIS — Z3A29 29 weeks gestation of pregnancy: Secondary | ICD-10-CM

## 2019-06-03 NOTE — Progress Notes (Signed)
   PRENATAL VISIT NOTE  Subjective:  Stacey Rodriguez is a 23 y.o. G1P0 at [redacted]w[redacted]d being seen today for ongoing prenatal care.  She is currently monitored for the following issues for this low-risk pregnancy and has ECZEMA; Substance abuse ([place]); Adjustment disorder with mixed anxiety and depressed mood; Supervision of other normal pregnancy, antepartum; Alpha thalassemia silent carrier; Sickle cell trait ([place]); and Genital herpes affecting pregnancy on their problem list.  Patient reports no complaints.  Contractions: Not present. Vag. Bleeding: None.  Movement: Present. Denies leaking of fluid.   The following portions of the patient's history were reviewed and updated as appropriate: allergies, current medications, past family history, past medical history, past social history, past surgical history and problem list.   Objective:   Vitals:   06/03/19 0827  BP: 113/68  Pulse: 91  Weight: 182 lb 6.4 oz (82.7 kg)    Fetal Status: Fetal Heart Rate (bpm): 148 Fundal Height: 29 cm Movement: Present     General:  Alert, oriented and cooperative. Patient is in no acute distress.  Skin: Skin is warm and dry. No rash noted.   Cardiovascular: Normal heart rate noted  Respiratory: Normal respiratory effort, no problems with respiration noted  Abdomen: Soft, gravid, appropriate for gestational age.  Pain/Pressure: Present     Pelvic: Cervical exam deferred        Extremities: Normal range of motion.  Edema: None  Mental Status: Normal mood and affect. Normal behavior. Normal judgment and thought content.   Assessment and Plan:  Pregnancy: G1P0 at [redacted]w[redacted]d 1. Supervision of other normal pregnancy, antepartum -doing well -wants tdap next visit -given list of peds -in person visit since BP cuff doesn't work  Preterm labor symptoms and general obstetric precautions including but not limited to vaginal bleeding, contractions, leaking of fluid and fetal movement were reviewed in detail with the  patient. Please refer to After Visit Summary for other counseling recommendations.   Return in about 2 weeks (around 06/17/2019) for Routine OB in person.  No future appointments.  Jorje Guild, NP

## 2019-06-03 NOTE — Patient Instructions (Signed)
Third Trimester of Pregnancy The third trimester is from week 28 through week 40 (months 7 through 9). The third trimester is a time when the unborn baby (fetus) is growing rapidly. At the end of the ninth month, the fetus is about 20 inches in length and weighs 6-10 pounds. Body changes during your third trimester Your body will continue to go through many changes during pregnancy. The changes vary from woman to woman. During the third trimester:  Your weight will continue to increase. You can expect to gain 25-35 pounds (11-16 kg) by the end of the pregnancy.  You may begin to get stretch marks on your hips, abdomen, and breasts.  You may urinate more often because the fetus is moving lower into your pelvis and pressing on your bladder.  You may develop or continue to have heartburn. This is caused by increased hormones that slow down muscles in the digestive tract.  You may develop or continue to have constipation because increased hormones slow digestion and cause the muscles that push waste through your intestines to relax.  You may develop hemorrhoids. These are swollen veins (varicose veins) in the rectum that can itch or be painful.  You may develop swollen, bulging veins (varicose veins) in your legs.  You may have increased body aches in the pelvis, back, or thighs. This is due to weight gain and increased hormones that are relaxing your joints.  You may have changes in your hair. These can include thickening of your hair, rapid growth, and changes in texture. Some women also have hair loss during or after pregnancy, or hair that feels dry or thin. Your hair will most likely return to normal after your baby is born.  Your breasts will continue to grow and they will continue to become tender. A yellow fluid (colostrum) may leak from your breasts. This is the first milk you are producing for your baby.  Your belly button may stick out.  You may notice more swelling in your hands,  face, or ankles.  You may have increased tingling or numbness in your hands, arms, and legs. The skin on your belly may also feel numb.  You may feel short of breath because of your expanding uterus.  You may have more problems sleeping. This can be caused by the size of your belly, increased need to urinate, and an increase in your body's metabolism.  You may notice the fetus "dropping," or moving lower in your abdomen (lightening).  You may have increased vaginal discharge.  You may notice your joints feel loose and you may have pain around your pelvic bone. What to expect at prenatal visits You will have prenatal exams every 2 weeks until week 36. Then you will have weekly prenatal exams. During a routine prenatal visit:  You will be weighed to make sure you and the baby are growing normally.  Your blood pressure will be taken.  Your abdomen will be measured to track your baby's growth.  The fetal heartbeat will be listened to.  Any test results from the previous visit will be discussed.  You may have a cervical check near your due date to see if your cervix has softened or thinned (effaced).  You will be tested for Group B streptococcus. This happens between 35 and 37 weeks. Your health care provider may ask you:  What your birth plan is.  How you are feeling.  If you are feeling the baby move.  If you have had any abnormal   symptoms, such as leaking fluid, bleeding, severe headaches, or abdominal cramping.  If you are using any tobacco products, including cigarettes, chewing tobacco, and electronic cigarettes.  If you have any questions. Other tests or screenings that may be performed during your third trimester include:  Blood tests that check for low iron levels (anemia).  Fetal testing to check the health, activity level, and growth of the fetus. Testing is done if you have certain medical conditions or if there are problems during the pregnancy.  Nonstress test  (NST). This test checks the health of your baby to make sure there are no signs of problems, such as the baby not getting enough oxygen. During this test, a belt is placed around your belly. The baby is made to move, and its heart rate is monitored during movement. What is false labor? False labor is a condition in which you feel small, irregular tightenings of the muscles in the womb (contractions) that usually go away with rest, changing position, or drinking water. These are called Braxton Hicks contractions. Contractions may last for hours, days, or even weeks before true labor sets in. If contractions come at regular intervals, become more frequent, increase in intensity, or become painful, you should see your health care provider. What are the signs of labor?  Abdominal cramps.  Regular contractions that start at 10 minutes apart and become stronger and more frequent with time.  Contractions that start on the top of the uterus and spread down to the lower abdomen and back.  Increased pelvic pressure and dull back pain.  A watery or bloody mucus discharge that comes from the vagina.  Leaking of amniotic fluid. This is also known as your "water breaking." It could be a slow trickle or a gush. Let your health care provider know if it has a color or strange odor. If you have any of these signs, call your health care provider right away, even if it is before your due date. Follow these instructions at home: Medicines  Follow your health care provider's instructions regarding medicine use. Specific medicines may be either safe or unsafe to take during pregnancy.  Take a prenatal vitamin that contains at least 600 micrograms (mcg) of folic acid.  If you develop constipation, try taking a stool softener if your health care provider approves. Eating and drinking   Eat a balanced diet that includes fresh fruits and vegetables, whole grains, good sources of protein such as meat, eggs, or tofu,  and low-fat dairy. Your health care provider will help you determine the amount of weight gain that is right for you.  Avoid raw meat and uncooked cheese. These carry germs that can cause birth defects in the baby.  If you have low calcium intake from food, talk to your health care provider about whether you should take a daily calcium supplement.  Eat four or five small meals rather than three large meals a day.  Limit foods that are high in fat and processed sugars, such as fried and sweet foods.  To prevent constipation: ? Drink enough fluid to keep your urine clear or pale yellow. ? Eat foods that are high in fiber, such as fresh fruits and vegetables, whole grains, and beans. Activity  Exercise only as directed by your health care provider. Most women can continue their usual exercise routine during pregnancy. Try to exercise for 30 minutes at least 5 days a week. Stop exercising if you experience uterine contractions.  Avoid heavy lifting.  Do   not exercise in extreme heat or humidity, or at high altitudes.  Wear low-heel, comfortable shoes.  Practice good posture.  You may continue to have sex unless your health care provider tells you otherwise. Relieving pain and discomfort  Take frequent breaks and rest with your legs elevated if you have leg cramps or low back pain.  Take warm sitz baths to soothe any pain or discomfort caused by hemorrhoids. Use hemorrhoid cream if your health care provider approves.  Wear a good support bra to prevent discomfort from breast tenderness.  If you develop varicose veins: ? Wear support pantyhose or compression stockings as told by your healthcare provider. ? Elevate your feet for 15 minutes, 3-4 times a day. Prenatal care  Write down your questions. Take them to your prenatal visits.  Keep all your prenatal visits as told by your health care provider. This is important. Safety  Wear your seat belt at all times when driving.  Make  a list of emergency phone numbers, including numbers for family, friends, the hospital, and police and fire departments. General instructions  Avoid cat litter boxes and soil used by cats. These carry germs that can cause birth defects in the baby. If you have a cat, ask someone to clean the litter box for you.  Do not travel far distances unless it is absolutely necessary and only with the approval of your health care provider.  Do not use hot tubs, steam rooms, or saunas.  Do not drink alcohol.  Do not use any products that contain nicotine or tobacco, such as cigarettes and e-cigarettes. If you need help quitting, ask your health care provider.  Do not use any medicinal herbs or unprescribed drugs. These chemicals affect the formation and growth of the baby.  Do not douche or use tampons or scented sanitary pads.  Do not cross your legs for long periods of time.  To prepare for the arrival of your baby: ? Take prenatal classes to understand, practice, and ask questions about labor and delivery. ? Make a trial run to the hospital. ? Visit the hospital and tour the maternity area. ? Arrange for maternity or paternity leave through employers. ? Arrange for family and friends to take care of pets while you are in the hospital. ? Purchase a rear-facing car seat and make sure you know how to install it in your car. ? Pack your hospital bag. ? Prepare the baby's nursery. Make sure to remove all pillows and stuffed animals from the baby's crib to prevent suffocation.  Visit your dentist if you have not gone during your pregnancy. Use a soft toothbrush to brush your teeth and be gentle when you floss. Contact a health care provider if:  You are unsure if you are in labor or if your water has broken.  You become dizzy.  You have mild pelvic cramps, pelvic pressure, or nagging pain in your abdominal area.  You have lower back pain.  You have persistent nausea, vomiting, or diarrhea.   You have an unusual or bad smelling vaginal discharge.  You have pain when you urinate. Get help right away if:  Your water breaks before 37 weeks.  You have regular contractions less than 5 minutes apart before 37 weeks.  You have a fever.  You are leaking fluid from your vagina.  You have spotting or bleeding from your vagina.  You have severe abdominal pain or cramping.  You have rapid weight loss or weight gain.  You have   shortness of breath with chest pain.  You notice sudden or extreme swelling of your face, hands, ankles, feet, or legs.  Your baby makes fewer than 10 movements in 2 hours.  You have severe headaches that do not go away when you take medicine.  You have vision changes. Summary  The third trimester is from week 28 through week 40, months 7 through 9. The third trimester is a time when the unborn baby (fetus) is growing rapidly.  During the third trimester, your discomfort may increase as you and your baby continue to gain weight. You may have abdominal, leg, and back pain, sleeping problems, and an increased need to urinate.  During the third trimester your breasts will keep growing and they will continue to become tender. A yellow fluid (colostrum) may leak from your breasts. This is the first milk you are producing for your baby.  False labor is a condition in which you feel small, irregular tightenings of the muscles in the womb (contractions) that eventually go away. These are called Braxton Hicks contractions. Contractions may last for hours, days, or even weeks before true labor sets in.  Signs of labor can include: abdominal cramps; regular contractions that start at 10 minutes apart and become stronger and more frequent with time; watery or bloody mucus discharge that comes from the vagina; increased pelvic pressure and dull back pain; and leaking of amniotic fluid. This information is not intended to replace advice given to you by your health  care provider. Make sure you discuss any questions you have with your health care provider. Document Released: 07/25/2001 Document Revised: 11/21/2018 Document Reviewed: 09/05/2016 Elsevier Patient Education  2020 Elsevier Inc.    AREA PEDIATRIC/FAMILY PRACTICE PHYSICIANS  [place]outheast [place] ([address]) . [facility] Melodie Bouillon, MD; Lum Babe, MD; Sheffield Slider, MD; Leveda Anna, MD; McDiarmid, MD; Jerene Bears, MD; Jennette Kettle, MD; Gwendolyn Grant, MD o [address] [place] [place], [place] [address] o [phone] o Mon-Fri 8:30-12:30, 1:30-5:00 o Providers come to see babies at [facility] o Accepting Medicaid . [facility] o Limited providers who accept newborns: Docia Chuck, MD; Kateri Plummer, MD; Paulino Rily, MD o [address], [place], [place] [address] o [phone] o Mon-Fri 8:00-5:30 o Babies seen by providers at [facility] o Does NOT accept Medicaid o Please call early in hospitalization for appointment (limited availability)  . [facility] Fatima Sanger, MD o [address], [place], [place] [address] o [phone] o Mon, Tue, Thur, Fri 8:30-5:00, Wed 10:00-7:00 (closed 1-2pm) o Babies seen by [facility] providers o Accepting Medicaid . Donnie Coffin - Pediatrician Fae Pippin, MD o [address], [place], [place] [address] o [phone] o Mon-Fri 8:30-5:00, Sat 8:30-12:00 o Provider comes to see babies at [facility] o Accepting Medicaid o Must have been referred from current patients or contacted office prior to delivery . [facility]) Leotis Pain, MD; Ave Filter, MD; Luna Fuse, MD; Kennedy Bucker, MD; Konrad Dolores, MD; Kathlene November, MD; Jenne Campus, MD; Lubertha South, MD; Wynetta Emery, MD; Duffy Rhody, MD; Gerre Couch, NP; Shirl Harris, NP o [address] [place], [place], [place] [address] o [phone] o Mon, Tue, Thur, Fri 8:30-5:30, Wed 9:30-5:30, Sat 8:30-12:30 o Babies  seen by [facility] providers o Accepting Medicaid o Only accepting infants of first-time parents or siblings of current patients [facility] discharge coordinator will make follow-up appointment . Cyril Mourning o [address], [place], [place]  [address] o [phone]  Fax - [phone] . [facility] o [address], [place], [place]  [address] o Phone - [phone]   Fax - [phone] . Lucio Edward o [address], [place], [place]  [address] o [phone]  [place] [phone]) . [facility] Jorge Mandril, MD; Alita Chyle, MD; Princella Ion, MD; MD; Earlene Plater, MD; Jamesetta Orleans, MD; Alvera Novel, MD; Clarene Duke, MD; Rana Snare, MD; Carmon Ginsberg, MD; Alinda Money, MD; Hosie Poisson, MD; Mayford Knife, MD o [address] [place], [place] [address] o [phone] o Mon-Fri 8:30-5:00 (extended evenings Mon-Thur as needed), Sat-Sun 10:00-1:00 o Providers come to see babies at [facility] o Accepting Medicaid for families of first-time babies and families with all children in the household age 45 and under. Must register with office prior to making appointment (M-F only). [facility] Odella Aquas, NP; Lynelle Doctor, MD; Susann Givens, MD; [place], [place] o [address], [place], [place] [address] o [phone] o Mon-Fri 8:00-5:00 o Babies seen by providers at [facility] o Does NOT accept Medicaid/Commercial Insurance Only . [facility] [place] ([facility])  Suzette Battiest, MD; Zachery Dauer, MD; Stefan Church, MD; Sabino Dick, MD; Quitman Livings, MD; Farris Has, MD; Gaynell Face, MD; Betha Loa, MD; Colon Flattery, MD; [place] James, MD o [address] [place], [place] [address] o [phone] o Mon-Fri 8:30-5:30, Sat (Oct.-Mar.) 9:00-1:00 o Babies seen by providers at [facility] o Accepting [facility] [phone]) . [facility] Dimitri, MD; Sheliah Hatch, MD o [address], [place], [place] [address] o [phone] o  Mon-Fri 8:30-5:00, Sat 8:30-12:00 o Providers come to see babies at [facility] o Does NOT accept Medicaid . [facility] Cindy Hazy, [place]; Floyd, MD; [place], [place]; Wynelle Link, MD; Azucena Cecil, MD o [address], [place], [place] [address] o [phone] o Mon-Fri 8:00-5:00 o Babies seen by providers at [facility] o Does NOT accept Medicaid o Only accepting babies of parents who are patients o Please call early in hospitalization for appointment (limited availability) . [facility] Lamar Benes, MD; Abran Cantor, MD; Early Osmond, MD; Cherre Huger, NP; Hyacinth Meeker, MD; Dwan Bolt, MD; Jarold Motto, NP; Dario Guardian, MD; Talmage Nap, MD; Maisie Fus, MD; Pricilla Holm, MD; Tama High, MD o [address] [place], [place], [place] [address] o [phone] o Mon-Fri 8:00-5:00, Sat 9:00-12:00 o Providers come to see babies at [facility] o Does NOT accept [facility] [phone]) . [facility] o Limited providers accepting new patients: Drema Pry, NP; [place], [place] o [address], [place], [place] [address] o [phone] o Mon-Fri 8:00-5:00 o Babies seen by providers at [facility] o Does NOT accept Medicaid o Only accepting babies of parents who are patients o Please call early in hospitalization for appointment (limited availability) . [facility] Luan Pulling, MD; Nash Dimmer, MD o [address] [place] [place], [place] [address] o [phone] (press 1 to schedule appointment) o Mon-Fri 8:00-5:00 o Providers come to see babies at [facility] o Does NOT accept Medicaid . [facility] Cristino Martes, MD o [address], [place], [place] [address] o [phone] o Mon-Fri 8:30-5:00 (lunch 12:30-1:00), extended hours by appointment only Wed 5:00-6:30 o Babies seen by [facility] providers o Accepting Medicaid . [facility], MD; [place], MD; Hassan Rowan, MD o [address] [place], [place], [place] [address] o  [phone] o Mon-Fri 8:00-5:00 o Babies seen by [facility] providers o Does NOT accept Medicaid . Nature conservation officer at [place] Minta Balsam, MD; Durene Cal, MD; Earlene Plater,  DO o [address] [place], [place] [address] o [phone] o Mon-Fri 8:00-5:00 o Babies seen by [facility] providers o Does NOT accept Medicaid . [facility] o [place], [place]; [place], [place]; [place], NP; Avis Epley, MD; Vonna Kotyk, MD; Clance Boll, MD; Stevphen [place], NP; Arvilla Market, NP; Ann Maki, NP; Otis Dials, NP; Vaughan Basta, MD; [place], MD o [address] [place], [place] [address] o [phone] o Mon-Fri 8:30-5:00, Sat 10:00-1:00 o Providers come to see babies at [facility] o Does NOT accept Medicaid o Free prenatal information session Tuesdays at 4:45pm . [facility] Luna Kitchens, MD; [place], [place]; [place], [place]; [place], [place] o [address] [place] [address] o [phone] o Mon-Fri 7:30-5:30 o Babies seen by [facility] providers . [facility] o [address], [place], [place]  [address] o [phone]   Fax - [phone]  [facility] [phone] & [phone]) . [facility] Alphonsa Overall, MD o [address] [place], [place] [address] o [phone] o Mon-Thur 8:00-6:00 o Providers come to see babies at [facility] o Accepting Medicaid . [facility] Zenon Mayo, NP; Cyndia Bent, MD; [place], [place]; [place], [place] o [address] [place], [place] [address] o [phone] o Mon-Thur 7:30-7:30, Fri 7:30-4:30 o Babies seen by [facility] providers o Accepting Medicaid . [facility] Cheryle Horsfall, MD; Janene Harvey, NP; Vonita Moss, MD o [address], [place], [place] [address] o [phone] o Mon-Fri 8:30-5:00, Sat 8:30-12:00 o Providers come to see babies at [facility] o Accepting Medicaid o Must have "Meet & Greet" appointment at office prior to delivery . [facility]  [place] ([facility]) Llana Aliment, MD; Earlene Plater, MD; Lucretia Roers, MD o [address], [place], [place] [address] o [phone] o Mon-Wed 8:00-6:00, Thur-Fri 8:00-5:00, Sat 9:00-12:00 o Providers come to see babies at [facility] o Does NOT accept Medicaid o Only accepting siblings of current patients . [facility]  o [address], [place], [place]  [address] o [phone]   Fax - [phone] . [facility] at [facility] o [id number] [place], [place]  [address] o [phone]   Fax - [phone]  [place] [phone] & [phone]) . Nature conservation officer at [facility] o [place], DO; Malcolm, DO o [address] [place], [place] [address] o [phone] o Mon-Fri 7:00-5:00 o Babies seen by [facility] providers o Does NOT accept Medicaid . [facility] Ellis Savage, MD; [place], [place]; [place], [place] o [address], [place], [place] [address] o [phone] o Mon-Fri 8:00-5:00 o Babies seen by [facility] providers o Accepting Medicaid . [facility] - [address] Franne Forts, MD; [place], [place]; [place], NP; [place], [place] o [address] [place], [place], [place] [address] o [phone] o Mon-Fri 8:00-5:00 o Babies seen by providers at [facility] o Accepting [facility] [phone]) . [facility] [place], [place] o [address] [place], [place] [address] o [phone] o Mon-Fri 8:00-5:00 o Babies seen by [facility] providers o Does NOT accept Medicaid o Limited availability, please call early in hospitalization to schedule follow-up . [facility] Jolee Ewing, PA; Eddie Candle, MD; [place], MD; [place], [place]; [place], MD; [place], [place] o [address] [facility] [address], [place], [place] [address] o [phone] o Mon-Fri 8:30-5:00, Sat 9:00-12:00  o Babies seen by providers at [facility] o Accepting Medicaid o Please register online then schedule online or call office o [email or web address] . [facility]) Samuella Bruino Hunter, NP; Lucianne MussKumar, MD; [place] ClamMartin Rogers, PA o [address], [place], [place] [address] o [phone] o Mon-Fri 8:00-5:00 o Babies seen by providers at [facility] o Accepting Medicaid . [facility] Sharin [place], MD; Reed BreechKristi Fleenor, NP; Shelva MajesticWest, MD o [address], [place], [place] [address] o [phone] o Mon-Fri 8:00-5:30, Sat&Sun by appointment (phones open at 8:30) o Babies seen by [facility] providers o Accepting Medicaid o Must be a first-time baby or sibling of current patient . [facility]  o [address], [address], [place], [place]  [address] o [phone]   Fax - [phone]  [place] [phone]) . [facility] o [place], [place]; [place], [place]; Dimple Caseyice, MD; [place], [place]; Carolyne FiscalSpry, MD o [address] [place], [place] [address] o [phone] o Mon-Thur 8:00-7:00, Fri 8:00-5:00, Sat 8:00-12:00, Sun 9:00-12:00 o Bab[facility] by [facility] providers o Accepting Medicaid . [facility] o Coe-Goins, MD; Gaynell FaceMarshall, MD; [facility]-Louis, MD o [address], [place], [place] [address] o [phone] o Mon-Thur 8:00-5:00 o Babies seen by providers at [facility] o Accepting Medicaid . [facility] [place] Gwenlyn Sarano Bratton, MD; Coe-Goins, MD; Madilyn FiremanHayes, MD; Melvyn NethLewis, MD; List, MD; Lazarus SalinesLott, MD; Gaynell FaceMarshall, MD; Berneda RoseMoran, MD; Flora Lipps'Neal, MD; Beryl MeagerPierre-Louis, MD; Luther RedoPitonzo, MD; Lavonia DraftsScholer, MD; Kellie SimmeringSpangle, MD o [address] [place], [place] [address] o [phone] o Mon-Fri 8:00-5:30, Sat (Oct.-Mar.) 9:00-1:00 o Babies seen by providers at [facility] o  Accepting Medicaid o Must fill out new patient packet, available online at [email or web address] . [facility] [address] [facility]) Simone Curiao Friddle, NP; Tiburcio PeaHarris, NP; Tresa EndoKelly, NP; Whitney PostLogan, MD; [place], [place]; Hennie DuosPoth, MD; Wynne Dustamadoss, MD; Kavin LeechStanton, NP o [address], [place], [place] [address] o [phone] o Mon-Thur 8:00-5:30, Fri 8:00-5:00 o Babies seen by providers at [facility] o Accepting [facility]  [place] [phone] . [facility] o [place], [place]; Atwood[place]ham, MD; Tanya NonesPickard, MD; [place], [place] o [address], [place], [place] [address] o [phone] o Mon-Fri 8:00-5:00 o Babies seen by providers at [facility] o Accepting [facility]   [place] [phone] . [facility] o WestoverMasneri, DO; Lenise ArenaMeyers, MD; HartNelson, [place] o [address], [place], [place] [address] o [phone] o Mon-Fri 8:00-5:00 o Babies seen by providers at [facility] o Does NOT accept Medicaid o Limited appointment availability, please call early in hospitalization  . Nature conservation [facility]LeBauer [facility] at [facility] o CottonwoodKunedd, DO; Mastic BeachMcGowen, MD o [address], [place], [place] [address] o [phone] o Mon-Fri 8:00-5:00 o Babies seen by [facility] providers o Does NOT accept Medicaid . [facility] [place] Lorrine Kino Cameron, MD; Ninetta LightsMacDonald, MD; P[place]own[place]aels, [place]; HendersonNayak, MD o [address], [place], [place] [address] o [phone] o Mon-Fri 8:00-5:00 o After hours clinic [facility]([address] [place], [place] [address]) [phone] Mon-Fri 5:00-8:00, Sat 12:00-6:00, Sun 10:00-4:00 o Babies seen by [facility] providers o Accepting Medicaid . [facility] o [address], [place], [place]  [address] o [phone]   Fax -  [place] [phone]) . Therapist, music at [facility], MD o [address] [place], [place], [place] [address] o [phone] o Mon-Fri  8:00-5:00 o Babies seen by [facility] providers o Does NOT accept Medicaid . [place] [facility] [place] Bing Neighbors, MD o [address], [place], [place] [address] o [phone] o Mon-Thur 8:00-7:00, Fri 8:00-5:00, Sat 8:00-12:00 o Babies seen by providers at [facility] o Accepting Medicaid - but does not have vaccinations in office (must be received elsewhere) o Limited availability, please call early in hospitalization   ([phone]) . [place], [place] [address], [place] [address] o [phone]  Fax [phone]

## 2019-06-17 ENCOUNTER — Other Ambulatory Visit: Payer: Self-pay

## 2019-06-17 ENCOUNTER — Ambulatory Visit (INDEPENDENT_AMBULATORY_CARE_PROVIDER_SITE_OTHER): Payer: Medicaid Other | Admitting: Advanced Practice Midwife

## 2019-06-17 VITALS — BP 119/72 | HR 97 | Wt 186.0 lb

## 2019-06-17 DIAGNOSIS — Z3480 Encounter for supervision of other normal pregnancy, unspecified trimester: Secondary | ICD-10-CM

## 2019-06-17 DIAGNOSIS — Z3483 Encounter for supervision of other normal pregnancy, third trimester: Secondary | ICD-10-CM

## 2019-06-17 DIAGNOSIS — Z3A31 31 weeks gestation of pregnancy: Secondary | ICD-10-CM

## 2019-06-17 NOTE — Progress Notes (Signed)
   PRENATAL VISIT NOTE  Subjective:  Stacey Rodriguez is a 23 y.o. G1P0 at [redacted]w[redacted]d being seen today for ongoing prenatal care.  She is currently monitored for the following issues for this low-risk pregnancy and has ECZEMA; Substance abuse ([place]); Adjustment disorder with mixed anxiety and depressed mood; Supervision of other normal pregnancy, antepartum; Alpha thalassemia silent carrier; Sickle cell trait ([place]); and Genital herpes affecting pregnancy on their problem list.  Patient reports no complaints.  Contractions: Irritability. Vag. Bleeding: None.  Movement: Present. Denies leaking of fluid.   The following portions of the patient's history were reviewed and updated as appropriate: allergies, current medications, past family history, past medical history, past social history, past surgical history and problem list.   Objective:   Vitals:   06/17/19 0921  BP: 119/72  Pulse: 97  Weight: 186 lb (84.4 kg)    Fetal Status: Fetal Heart Rate (bpm): 148 Fundal Height: 32 cm Movement: Present   Vertex by leopold's today   General:  Alert, oriented and cooperative. Patient is in no acute distress.  Skin: Skin is warm and dry. No rash noted.   Cardiovascular: Normal heart rate noted  Respiratory: Normal respiratory effort, no problems with respiration noted  Abdomen: Soft, gravid, appropriate for gestational age.  Pain/Pressure: Present     Pelvic: Cervical exam deferred        Extremities: Normal range of motion.  Edema: Trace  Mental Status: Normal mood and affect. Normal behavior. Normal judgment and thought content.   Assessment and Plan:  Pregnancy: G1P0 at [redacted]w[redacted]d 1. Supervision of other normal pregnancy, antepartum - Routine care  Preterm labor symptoms and general obstetric precautions including but not limited to vaginal bleeding, contractions, leaking of fluid and fetal movement were reviewed in detail with the patient. Please refer to After Visit Summary for other counseling  recommendations.   Return in about 2 weeks (around 07/01/2019) for in person visit .  Future Appointments  Date Time Provider [place]  07/01/2019  2:35 PM Tresea Mall, CNM [facility] [place]  07/15/2019  2:35 PM Tresea Mall, CNM WOC[facility]A [place]  07/22/2019  2:35 PM Ardean Larsen, Mervyn Skeeters, CNM [facility] [place]  07/29/2019  2:35 PM Ardean Larsen, Mervyn Skeeters, CNM [facility] [place]  08/05/2019  2:35 PM Tresea Mall, CNM [facility] [place]  08/12/2019  2:35 PM Ardean Larsen, Mervyn Skeeters, CNM [facility]OC[facility] [place] DNP, CNM  06/17/19  9:56 AM

## 2019-07-01 ENCOUNTER — Other Ambulatory Visit: Payer: Self-pay

## 2019-07-01 ENCOUNTER — Encounter: Payer: Self-pay | Admitting: Advanced Practice Midwife

## 2019-07-01 ENCOUNTER — Ambulatory Visit (INDEPENDENT_AMBULATORY_CARE_PROVIDER_SITE_OTHER): Payer: Medicaid Other | Admitting: Advanced Practice Midwife

## 2019-07-01 VITALS — BP 103/67 | HR 79 | Wt 184.0 lb

## 2019-07-01 DIAGNOSIS — Z3483 Encounter for supervision of other normal pregnancy, third trimester: Secondary | ICD-10-CM | POA: Diagnosis not present

## 2019-07-01 DIAGNOSIS — Z3A33 33 weeks gestation of pregnancy: Secondary | ICD-10-CM | POA: Diagnosis not present

## 2019-07-01 DIAGNOSIS — Z23 Encounter for immunization: Secondary | ICD-10-CM | POA: Diagnosis not present

## 2019-07-01 DIAGNOSIS — Z3480 Encounter for supervision of other normal pregnancy, unspecified trimester: Secondary | ICD-10-CM

## 2019-07-01 NOTE — Progress Notes (Signed)
   PRENATAL VISIT NOTE  Subjective:  Stacey Rodriguez is a 23 y.o. G1P0 at [redacted]w[redacted]d being seen today for ongoing prenatal care.  She is currently monitored for the following issues for this low-risk pregnancy and has ECZEMA; Substance abuse ([place]); Adjustment disorder with mixed anxiety and depressed mood; Supervision of other normal pregnancy, antepartum; Alpha thalassemia silent carrier; Sickle cell trait ([place]); and Genital herpes affecting pregnancy on their problem list.  Patient reports no complaints.  Contractions: Irregular. Vag. Bleeding: None.  Movement: Present. Denies leaking of fluid.   The following portions of the patient's history were reviewed and updated as appropriate: allergies, current medications, past family history, past medical history, past social history, past surgical history and problem list.   Objective:   Vitals:   07/01/19 1504  BP: 103/67  Pulse: 79  Weight: 184 lb (83.5 kg)    Fetal Status: Fetal Heart Rate (bpm): 131 Fundal Height: 34 cm Movement: Present     General:  Alert, oriented and cooperative. Patient is in no acute distress.  Skin: Skin is warm and dry. No rash noted.   Cardiovascular: Normal heart rate noted  Respiratory: Normal respiratory effort, no problems with respiration noted  Abdomen: Soft, gravid, appropriate for gestational age.  Pain/Pressure: Present     Pelvic: Cervical exam deferred        Extremities: Normal range of motion.  Edema: Trace  Mental Status: Normal mood and affect. Normal behavior. Normal judgment and thought content.   Assessment and Plan:  Pregnancy: G1P0 at [redacted]w[redacted]d 1. Supervision of other normal pregnancy, antepartum - routine care - Tdap vaccine greater than or equal to 7yo IM  Preterm labor symptoms and general obstetric precautions including but not limited to vaginal bleeding, contractions, leaking of fluid and fetal movement were reviewed in detail with the patient. Please refer to After Visit Summary  for other counseling recommendations.   Return in about 2 weeks (around 07/15/2019) for in person .  Future Appointments  Date Time Provider [place]  07/15/2019  2:35 PM Tresea Mall, CNM [facility] [place]  07/22/2019  2:35 PM Ardean Larsen, Mervyn Skeeters, CNM [facility] [place]  07/29/2019  2:35 PM Ardean Larsen, Mervyn Skeeters, CNM [facility] [place]  08/05/2019  2:35 PM Tresea Mall, CNM [facility] [place]  08/12/2019  2:35 PM Ardean Larsen, Mervyn Skeeters, CNM WOC-WOCA [place], CNM  07/01/19  3:38 PM

## 2019-07-15 ENCOUNTER — Ambulatory Visit (INDEPENDENT_AMBULATORY_CARE_PROVIDER_SITE_OTHER): Payer: Medicaid Other | Admitting: Advanced Practice Midwife

## 2019-07-15 ENCOUNTER — Other Ambulatory Visit (HOSPITAL_COMMUNITY)
Admission: RE | Admit: 2019-07-15 | Discharge: 2019-07-15 | Disposition: A | Discharge status: Home / Self Care | Payer: Medicaid Other | Source: Ambulatory Visit | Attending: Advanced Practice Midwife | Admitting: Advanced Practice Midwife

## 2019-07-15 ENCOUNTER — Encounter: Payer: Self-pay | Admitting: Advanced Practice Midwife

## 2019-07-15 ENCOUNTER — Other Ambulatory Visit: Payer: Self-pay

## 2019-07-15 VITALS — BP 111/70 | HR 102 | Wt 184.0 lb

## 2019-07-15 DIAGNOSIS — Z3480 Encounter for supervision of other normal pregnancy, unspecified trimester: Secondary | ICD-10-CM | POA: Diagnosis not present

## 2019-07-15 DIAGNOSIS — Z3A35 35 weeks gestation of pregnancy: Secondary | ICD-10-CM

## 2019-07-15 DIAGNOSIS — Z3483 Encounter for supervision of other normal pregnancy, third trimester: Secondary | ICD-10-CM

## 2019-07-15 LAB — OB RESULTS CONSOLE GBS: GBS: POSITIVE

## 2019-07-15 NOTE — Progress Notes (Signed)
   PRENATAL VISIT NOTE  Subjective:  Stacey Rodriguez is a 23 y.o. G1P0 at [redacted]w[redacted]d being seen today for ongoing prenatal care.  She is currently monitored for the following issues for this low-risk pregnancy and has ECZEMA; Substance abuse ([place]); Adjustment disorder with mixed anxiety and depressed mood; Supervision of other normal pregnancy, antepartum; Alpha thalassemia silent carrier; Sickle cell trait ([place]); and Genital herpes affecting pregnancy on their problem list.  Patient reports no complaints.  Contractions: Irritability. Vag. Bleeding: None.  Movement: Present. Denies leaking of fluid.   The following portions of the patient's history were reviewed and updated as appropriate: allergies, current medications, past family history, past medical history, past social history, past surgical history and problem list.   Objective:   Vitals:   07/15/19 1437  BP: 111/70  Pulse: (!) 102  Weight: 184 lb (83.5 kg)    Fetal Status: Fetal Heart Rate (bpm): 136 Fundal Height: 36 cm Movement: Present     General:  Alert, oriented and cooperative. Patient is in no acute distress.  Skin: Skin is warm and dry. No rash noted.   Cardiovascular: Normal heart rate noted  Respiratory: Normal respiratory effort, no problems with respiration noted  Abdomen: Soft, gravid, appropriate for gestational age.  Pain/Pressure: Present     Pelvic: Cervical exam performed Dilation: Fingertip Effacement (%): Thick Station: -3  Extremities: Normal range of motion.  Edema: Trace  Mental Status: Normal mood and affect. Normal behavior. Normal judgment and thought content.   Assessment and Plan:  Pregnancy: G1P0 at [redacted]w[redacted]d 1. Supervision of other normal pregnancy, antepartum - routine care - Culture, beta strep (group b only) - Cervicovaginal ancillary only( [place])  Term labor symptoms and general obstetric precautions including but not limited to vaginal bleeding, contractions, leaking of fluid and fetal  movement were reviewed in detail with the patient. Please refer to After Visit Summary for other counseling recommendations.   Return in about 1 week (around 07/22/2019) for in person visit .  Future Appointments  Date Time Provider [place]  07/22/2019  2:35 PM [facility] [place]  07/29/2019  2:35 PM Ardean Larsen, Mervyn Skeeters, CNM [facility] [place]  08/05/2019  2:35 PM Tresea Mall, CNM [facility] [place]  08/12/2019  2:35 PM Ardean Larsen, Mervyn Skeeters, CNM WOC-W[facility] [place], CNM  07/15/19  3:04 PM

## 2019-07-17 LAB — CERVICOVAGINAL ANCILLARY ONLY
Chlamydia: NEGATIVE
Comment: NEGATIVE
Comment: NORMAL
Neisseria Gonorrhea: NEGATIVE

## 2019-07-18 LAB — CULTURE, BETA STREP (GROUP B ONLY): Strep Gp B Culture: POSITIVE — AB

## 2019-07-22 ENCOUNTER — Ambulatory Visit (INDEPENDENT_AMBULATORY_CARE_PROVIDER_SITE_OTHER): Payer: Medicaid Other | Admitting: Student

## 2019-07-22 ENCOUNTER — Other Ambulatory Visit: Payer: Self-pay

## 2019-07-22 VITALS — BP 104/69 | HR 96 | Wt 192.4 lb

## 2019-07-22 DIAGNOSIS — Z3480 Encounter for supervision of other normal pregnancy, unspecified trimester: Secondary | ICD-10-CM

## 2019-07-22 DIAGNOSIS — Z3A36 36 weeks gestation of pregnancy: Secondary | ICD-10-CM

## 2019-07-22 DIAGNOSIS — D573 Sickle-cell trait: Secondary | ICD-10-CM | POA: Diagnosis not present

## 2019-07-22 DIAGNOSIS — O99820 Streptococcus B carrier state complicating pregnancy: Secondary | ICD-10-CM | POA: Insufficient documentation

## 2019-07-22 NOTE — Progress Notes (Signed)
   PRENATAL VISIT NOTE  Subjective:  Stacey Rodriguez is a 23 y.o. G1P0 at [redacted]w[redacted]d being seen today for ongoing prenatal care.  She is currently monitored for the following issues for this low-risk pregnancy and has ECZEMA; Substance abuse ([place]); Adjustment disorder with mixed anxiety and depressed mood; Supervision of other normal pregnancy, antepartum; Alpha thalassemia silent carrier; Sickle cell trait ([name]); Genital herpes affecting pregnancy; and GBS (group B Streptococcus carrier), +RV culture, currently pregnant on their problem list.  Patient reports no complaints. She has not started taking her valtrex but she will. She wants her membranes stripped today. She says that she has not been taking her blood pressure because her BP cuff broke and she wants another one. She did not know that she can take it back to [employer] for another one.  Contractions: Irritability. Vag. Bleeding: None.  Movement: Present. Denies leaking of fluid.   The following portions of the patient's history were reviewed and updated as appropriate: allergies, current medications, past family history, past medical history, past social history, past surgical history and problem list.   Objective:   Vitals:   07/22/19 1453  BP: 104/69  Pulse: 96  Weight: 192 lb 6.4 oz (87.3 kg)    Fetal Status: Fetal Heart Rate (bpm):  141 Fundal Height: 36 cm Movement: Present  Presentation: Vertex  General:  Alert, oriented and cooperative. Patient is in no acute distress.  Skin: Skin is warm and dry. No rash noted.   Cardiovascular: Normal heart rate noted  Respiratory: Normal respiratory effort, no problems with respiration noted  Abdomen: Soft, gravid, appropriate for gestational age.  Pain/Pressure: Present     Pelvic: Cervical exam performed Dilation: Fingertip Effacement (%): Thick Station: Ballotable, -3  Extremities: Normal range of motion.  Edema: None  Mental Status: Normal mood and affect. Normal behavior.  Normal judgment and thought content.   Assessment and Plan:  Pregnancy: G1P0 at 36w6  1. Sickle cell trait ([name])   2. Supervision of other normal pregnancy, antepartum   3. GBS (group B Streptococcus carrier), +RV culture, currently pregnant    -patient is vertex by [place] -she will start taking her Valtrex this week.  -she has list of pediatricians at home -Urine culture done today (sickle cell trait).  -CMA emphasized to patient that she needs to take her BP; if she does not have BP cuff working in two weeks she will need to come in for a nurse visit.   Term labor symptoms and general obstetric precautions including but not limited to vaginal bleeding, contractions, leaking of fluid and fetal movement were reviewed in detail with the patient. Please refer to After Visit Summary for other counseling recommendations.   Return in about 2 weeks (around 08/05/2019), or for LROB on MyChart.  Future Appointments  Date Time Provider [place]  07/29/2019  2:35 PM [facility] [place]  08/05/2019  2:35 PM Tresea Mall, CNM WOC[facility]WOCA [place]  08/12/2019  2:35 PM Ardean Larsen, Mervyn Skeeters, [place], [place]

## 2019-07-25 ENCOUNTER — Encounter: Payer: Self-pay | Admitting: Student

## 2019-07-25 DIAGNOSIS — R8271 Bacteriuria: Secondary | ICD-10-CM | POA: Insufficient documentation

## 2019-07-25 LAB — URINE CULTURE, OB REFLEX

## 2019-07-25 LAB — CULTURE, OB URINE

## 2019-07-26 ENCOUNTER — Telehealth: Payer: Medicaid Other

## 2019-07-29 ENCOUNTER — Encounter: Payer: Medicaid Other | Admitting: Student

## 2019-08-05 ENCOUNTER — Other Ambulatory Visit: Payer: Self-pay

## 2019-08-05 ENCOUNTER — Telehealth (INDEPENDENT_AMBULATORY_CARE_PROVIDER_SITE_OTHER): Payer: Medicaid Other | Admitting: Advanced Practice Midwife

## 2019-08-05 ENCOUNTER — Encounter: Payer: Self-pay | Admitting: Advanced Practice Midwife

## 2019-08-05 VITALS — BP 118/75 | HR 102 | Temp 98.4°F | Wt 195.7 lb

## 2019-08-05 DIAGNOSIS — Z3A38 38 weeks gestation of pregnancy: Secondary | ICD-10-CM

## 2019-08-05 DIAGNOSIS — Z3483 Encounter for supervision of other normal pregnancy, third trimester: Secondary | ICD-10-CM

## 2019-08-05 DIAGNOSIS — Z3480 Encounter for supervision of other normal pregnancy, unspecified trimester: Secondary | ICD-10-CM

## 2019-08-05 NOTE — Progress Notes (Signed)
Pt desires Cx exam today

## 2019-08-05 NOTE — Progress Notes (Signed)
   PRENATAL VISIT NOTE  Subjective:  Stacey Rodriguez is a 23 y.o. G1P0 at [redacted]w[redacted]d being seen today for ongoing prenatal care.  She is currently monitored for the following issues for this low-risk pregnancy and has ECZEMA; Substance abuse ([place]); Adjustment disorder with mixed anxiety and depressed mood; Supervision of other normal pregnancy, antepartum; Alpha thalassemia silent carrier; Sickle cell trait ([place]); Genital herpes affecting pregnancy; GBS (group B Streptococcus carrier), +RV culture, currently pregnant; and GBS bacteriuria on their problem list.  Patient reports no complaints.  Contractions: Irritability. Vag. Bleeding: None.  Movement: Present. Denies leaking of fluid.   The following portions of the patient's history were reviewed and updated as appropriate: allergies, current medications, past family history, past medical history, past social history, past surgical history and problem list.   Objective:   Vitals:   08/05/19 1423  BP: 118/75  Pulse: (!) 102  Temp: 98.4 F (36.9 C)  Weight: 195 lb 11.2 oz (88.8 kg)    Fetal Status: Fetal Heart Rate (bpm): 133 Fundal Height: 39 cm Movement: Present  Presentation: Vertex  General:  Alert, oriented and cooperative. Patient is in no acute distress.  Skin: Skin is warm and dry. No rash noted.   Cardiovascular: Normal heart rate noted  Respiratory: Normal respiratory effort, no problems with respiration noted  Abdomen: Soft, gravid, appropriate for gestational age.  Pain/Pressure: Present     Pelvic: Cervical exam performed Dilation: Fingertip Effacement (%): Thick Station: -2  Extremities: Normal range of motion.  Edema: None  Mental Status: Normal mood and affect. Normal behavior. Normal judgment and thought content.   Assessment and Plan:  Pregnancy: G1P0 at [redacted]w[redacted]d 1. Supervision of other normal pregnancy, antepartum - routine care  Term labor symptoms and general obstetric precautions including but not limited to  vaginal bleeding, contractions, leaking of fluid and fetal movement were reviewed in detail with the patient. Please refer to After Visit Summary for other counseling recommendations.   Return in about 1 week (around 08/12/2019) for In person visit .  Future Appointments  Date Time Provider [place]  08/12/2019  2:35 PM [place], CNM [facility] DNP, CNM  08/05/19  3:15 PM

## 2019-08-12 ENCOUNTER — Other Ambulatory Visit: Payer: Self-pay

## 2019-08-12 ENCOUNTER — Other Ambulatory Visit: Payer: Self-pay | Admitting: Student

## 2019-08-12 ENCOUNTER — Ambulatory Visit (INDEPENDENT_AMBULATORY_CARE_PROVIDER_SITE_OTHER): Payer: Medicaid Other | Admitting: Student

## 2019-08-12 VITALS — BP 114/64 | HR 97 | Wt 198.1 lb

## 2019-08-12 DIAGNOSIS — Z3483 Encounter for supervision of other normal pregnancy, third trimester: Secondary | ICD-10-CM

## 2019-08-12 DIAGNOSIS — Z3480 Encounter for supervision of other normal pregnancy, unspecified trimester: Secondary | ICD-10-CM

## 2019-08-12 DIAGNOSIS — Z3A39 39 weeks gestation of pregnancy: Secondary | ICD-10-CM

## 2019-08-12 NOTE — Progress Notes (Signed)
   PRENATAL VISIT NOTE  Subjective:  Stacey Rodriguez is a 23 y.o. G1P0 at [redacted]w[redacted]d being seen today for ongoing prenatal care.  She is currently monitored for the following issues for this low-risk pregnancy and has ECZEMA; Substance abuse ([place]); Adjustment disorder with mixed anxiety and depressed mood; Supervision of other normal pregnancy, antepartum; Alpha thalassemia silent carrier; Sickle cell trait ([place]); Genital herpes affecting pregnancy; GBS (group B Streptococcus carrier), +RV culture, currently pregnant; and GBS bacteriuria on their problem list.  Patient reports off and on again contractions. .  Contractions: Irritability. Vag. Bleeding: None.  Movement: Present. Denies leaking of fluid.   The following portions of the patient's history were reviewed and updated as appropriate: allergies, current medications, past family history, past medical history, past social history, past surgical history and problem list.   Objective:   Vitals:   08/12/19 1441  BP: 114/64  Pulse: 97  Weight: 198 lb 1.6 oz (89.9 kg)    Fetal Status: Fetal Heart Rate (bpm): 128 Fundal Height: 39 cm Movement: Present     General:  Alert, oriented and cooperative. Patient is in no acute distress.  Skin: Skin is warm and dry. No rash noted.   Cardiovascular: Normal heart rate noted  Respiratory: Normal respiratory effort, no problems with respiration noted  Abdomen: Soft, gravid, appropriate for gestational age.  Pain/Pressure: Present     Pelvic: Cervical exam performed Dilation: Fingertip Effacement (%): Thick Station: -3  Extremities: Normal range of motion.  Edema: None  Mental Status: Normal mood and affect. Normal behavior. Normal judgment and thought content.   Assessment and Plan:  Pregnancy: G1P0 at [redacted]w[redacted]d 1. Supervision of other normal pregnancy, antepartum   -She is taking her valtrex daily -she has a pediatrician but does not remember the name; she will bring to hospital -explained IOL  procedure (cytotec, FB, pitocin). Patient was also given protocol for phone call and staying ready to come back to hospital when she is called for her IOL time on 08-19-2018. She also knows she will be called for her COVID test a few days prior.  -Cervix is still long, thick and patient does not tolerate vaginal exams well; not a good candidate for outpatient FB.  -IOL orders placed, including with GBS prophylaxis.   Term labor symptoms and general obstetric precautions including but not limited to vaginal bleeding, contractions, leaking of fluid and fetal movement were reviewed in detail with the patient. Please refer to After Visit Summary for other counseling recommendations.   Return try for NST/BPP on Thursday Dec 31st.  Future Appointments  Date Time Provider [place]  08/14/2019 10:15 AM WOC-WOCA NST WOC-WOCA WOC  08/20/2019  7:00 AM MC-LD [place], CNM

## 2019-08-12 NOTE — Patient Instructions (Signed)
New Induction of Labor Process for [place] and [place]  In Fall 2020 [place] and [place] changed it's process for scheduling inductions of labor to create more induction slots and to make sure patients get COVID-19 testing in advance. After you have been tested you need to quarantine so that you do not get infected after your test. You should not go anywhere after your test except necessary medical appointments.    You have been scheduled for induction of labor on 08-19-2018. Although you may have a specific time listed on your After Visit Summary or MyChart, we cannot predict when your room will be available. Please disregard this time. A Labor and Delivery staff member will call you on the day that you are scheduled when your room is available. You will need to arrive within one hour of being called. If you do not arrive within this time frame, the next person on the list will be called in and you will move down the list. You may eat a light meal before coming to the hospital. If you go into labor, think your water has broken, experience bright red bleeding or don't feel your baby moving as much as usual before your induction, please call your Ob/Gyn's office or come to Entrance C, Maternity Assessment Unit for evaluation.  Thank you,  [facility] for [employer]

## 2019-08-13 ENCOUNTER — Telehealth (HOSPITAL_COMMUNITY): Payer: Self-pay | Admitting: *Deleted

## 2019-08-13 ENCOUNTER — Encounter (HOSPITAL_COMMUNITY): Payer: Self-pay | Admitting: *Deleted

## 2019-08-13 NOTE — Telephone Encounter (Signed)
Preadmission screen  

## 2019-08-14 ENCOUNTER — Ambulatory Visit (INDEPENDENT_AMBULATORY_CARE_PROVIDER_SITE_OTHER): Payer: Medicaid Other | Admitting: *Deleted

## 2019-08-14 ENCOUNTER — Ambulatory Visit: Payer: Self-pay

## 2019-08-14 ENCOUNTER — Other Ambulatory Visit: Payer: Self-pay

## 2019-08-14 VITALS — BP 114/73 | HR 98 | Wt 199.4 lb

## 2019-08-14 DIAGNOSIS — O480 Post-term pregnancy: Secondary | ICD-10-CM

## 2019-08-14 DIAGNOSIS — Z3A40 40 weeks gestation of pregnancy: Secondary | ICD-10-CM | POA: Diagnosis not present

## 2019-08-15 ENCOUNTER — Other Ambulatory Visit: Payer: Self-pay | Admitting: Advanced Practice Midwife

## 2019-08-18 ENCOUNTER — Other Ambulatory Visit (HOSPITAL_COMMUNITY)
Admission: RE | Admit: 2019-08-18 | Discharge: 2019-08-18 | Disposition: A | Discharge status: Home / Self Care | Payer: Medicaid Other | Source: Ambulatory Visit | Attending: Family Medicine | Admitting: Family Medicine

## 2019-08-18 DIAGNOSIS — Z01812 Encounter for preprocedural laboratory examination: Secondary | ICD-10-CM | POA: Diagnosis not present

## 2019-08-18 DIAGNOSIS — Z20822 Contact with and (suspected) exposure to covid-19: Secondary | ICD-10-CM | POA: Insufficient documentation

## 2019-08-18 LAB — SARS CORONAVIRUS 2 (TAT 6-24 HRS): SARS Coronavirus 2: NEGATIVE

## 2019-08-20 ENCOUNTER — Inpatient Hospital Stay (HOSPITAL_COMMUNITY): Payer: Medicaid Other

## 2019-08-20 ENCOUNTER — Inpatient Hospital Stay (HOSPITAL_COMMUNITY): Payer: Medicaid Other | Admitting: Anesthesiology

## 2019-08-20 ENCOUNTER — Encounter (HOSPITAL_COMMUNITY): Payer: Self-pay | Admitting: Student

## 2019-08-20 ENCOUNTER — Encounter (HOSPITAL_COMMUNITY)
Admission: AD | Disposition: A | Discharge status: Home / Self Care | Payer: Self-pay | Source: Home / Self Care | Attending: Obstetrics & Gynecology

## 2019-08-20 ENCOUNTER — Other Ambulatory Visit: Payer: Self-pay

## 2019-08-20 ENCOUNTER — Inpatient Hospital Stay (HOSPITAL_COMMUNITY)
Admission: AD | Admit: 2019-08-20 | Discharge: 2019-08-23 | DRG: 787 | Disposition: A | Discharge status: Home / Self Care | Payer: Medicaid Other | Attending: Obstetrics & Gynecology | Admitting: Obstetrics & Gynecology

## 2019-08-20 DIAGNOSIS — Z3480 Encounter for supervision of other normal pregnancy, unspecified trimester: Secondary | ICD-10-CM

## 2019-08-20 DIAGNOSIS — Z30017 Encounter for initial prescription of implantable subdermal contraceptive: Secondary | ICD-10-CM

## 2019-08-20 DIAGNOSIS — O99824 Streptococcus B carrier state complicating childbirth: Secondary | ICD-10-CM | POA: Diagnosis not present

## 2019-08-20 DIAGNOSIS — D563 Thalassemia minor: Secondary | ICD-10-CM

## 2019-08-20 DIAGNOSIS — R8271 Bacteriuria: Secondary | ICD-10-CM | POA: Diagnosis present

## 2019-08-20 DIAGNOSIS — O9902 Anemia complicating childbirth: Secondary | ICD-10-CM | POA: Diagnosis present

## 2019-08-20 DIAGNOSIS — A6000 Herpesviral infection of urogenital system, unspecified: Secondary | ICD-10-CM | POA: Diagnosis present

## 2019-08-20 DIAGNOSIS — Z3A41 41 weeks gestation of pregnancy: Secondary | ICD-10-CM | POA: Diagnosis not present

## 2019-08-20 DIAGNOSIS — D573 Sickle-cell trait: Secondary | ICD-10-CM

## 2019-08-20 DIAGNOSIS — O480 Post-term pregnancy: Principal | ICD-10-CM | POA: Diagnosis present

## 2019-08-20 DIAGNOSIS — O98319 Other infections with a predominantly sexual mode of transmission complicating pregnancy, unspecified trimester: Secondary | ICD-10-CM

## 2019-08-20 DIAGNOSIS — Z3A00 Weeks of gestation of pregnancy not specified: Secondary | ICD-10-CM | POA: Diagnosis not present

## 2019-08-20 DIAGNOSIS — O9832 Other infections with a predominantly sexual mode of transmission complicating childbirth: Secondary | ICD-10-CM | POA: Diagnosis present

## 2019-08-20 DIAGNOSIS — A6009 Herpesviral infection of other urogenital tract: Secondary | ICD-10-CM | POA: Diagnosis present

## 2019-08-20 LAB — CBC
HCT: 33.6 % — ABNORMAL LOW (ref 36.0–46.0)
Hemoglobin: 11.1 g/dL — ABNORMAL LOW (ref 12.0–15.0)
MCH: 28.6 pg (ref 26.0–34.0)
MCHC: 33 g/dL (ref 30.0–36.0)
MCV: 86.6 fL (ref 80.0–100.0)
Platelets: 204 10*3/uL (ref 150–400)
RBC: 3.88 MIL/uL (ref 3.87–5.11)
RDW: 14 % (ref 11.5–15.5)
WBC: 7.6 10*3/uL (ref 4.0–10.5)
nRBC: 0 % (ref 0.0–0.2)

## 2019-08-20 LAB — TYPE AND SCREEN
ABO/RH(D): O POS
Antibody Screen: NEGATIVE

## 2019-08-20 LAB — ABO/RH: ABO/RH(D): O POS

## 2019-08-20 SURGERY — Surgical Case
Anesthesia: Regional

## 2019-08-20 MED ORDER — ACETAMINOPHEN 325 MG PO TABS: 650.0000 mg | ORAL_TABLET | ORAL | Status: DC | PRN

## 2019-08-20 MED ORDER — OXYCODONE-ACETAMINOPHEN 5-325 MG PO TABS: 1.0000 | ORAL_TABLET | ORAL | Status: DC | PRN

## 2019-08-20 MED ORDER — PHENYLEPHRINE 40 MCG/ML (10ML) SYRINGE FOR IV PUSH (FOR BLOOD PRESSURE SUPPORT)
80.0000 ug | PREFILLED_SYRINGE | INTRAVENOUS | Status: DC | PRN
  Filled 2019-08-20: qty 10

## 2019-08-20 MED ORDER — MISOPROSTOL 25 MCG QUARTER TABLET: 25.0000 ug | ORAL_TABLET | ORAL | Status: DC | PRN

## 2019-08-20 MED ORDER — PHENYLEPHRINE 40 MCG/ML (10ML) SYRINGE FOR IV PUSH (FOR BLOOD PRESSURE SUPPORT): 80.0000 ug | PREFILLED_SYRINGE | INTRAVENOUS | Status: DC | PRN

## 2019-08-20 MED ORDER — EPHEDRINE 5 MG/ML INJ: 10.0000 mg | INTRAVENOUS | Status: DC | PRN

## 2019-08-20 MED ORDER — OXYTOCIN BOLUS FROM INFUSION: 500.0000 mL | Freq: Once | INTRAVENOUS | Status: DC

## 2019-08-20 MED ORDER — SODIUM CHLORIDE 0.9 % IV SOLN
5.0000 10*6.[IU] | Freq: Once | INTRAVENOUS | Status: AC
  Administered 2019-08-20: 5 10*6.[IU] via INTRAVENOUS
  Filled 2019-08-20: qty 5

## 2019-08-20 MED ORDER — LACTATED RINGERS IV SOLN
500.0000 mL | INTRAVENOUS | Status: DC | PRN
  Administered 2019-08-21: 04:00:00 1000 mL via INTRAVENOUS

## 2019-08-20 MED ORDER — SOD CITRATE-CITRIC ACID 500-334 MG/5ML PO SOLN
30.0000 mL | ORAL | Status: DC | PRN
  Administered 2019-08-21: 30 mL via ORAL
  Filled 2019-08-20: qty 30

## 2019-08-20 MED ORDER — PENICILLIN G POT IN DEXTROSE 60000 UNIT/ML IV SOLN
3.0000 10*6.[IU] | INTRAVENOUS | Status: DC
  Administered 2019-08-20 – 2019-08-21 (×4): 3 10*6.[IU] via INTRAVENOUS
  Filled 2019-08-20 (×4): qty 50

## 2019-08-20 MED ORDER — ONDANSETRON HCL 4 MG/2ML IJ SOLN
4.0000 mg | Freq: Four times a day (QID) | INTRAMUSCULAR | Status: DC | PRN
  Administered 2019-08-21: 4 mg via INTRAVENOUS
  Filled 2019-08-20: qty 2

## 2019-08-20 MED ORDER — LACTATED RINGERS IV SOLN: 500.0000 mL | Freq: Once | INTRAVENOUS | Status: DC

## 2019-08-20 MED ORDER — FENTANYL CITRATE (PF) 100 MCG/2ML IJ SOLN
100.0000 ug | INTRAMUSCULAR | Status: DC | PRN
  Administered 2019-08-20 (×4): 100 ug via INTRAVENOUS
  Filled 2019-08-20 (×4): qty 2

## 2019-08-20 MED ORDER — FENTANYL-BUPIVACAINE-NACL 0.5-0.125-0.9 MG/250ML-% EP SOLN
12.0000 mL/h | EPIDURAL | Status: DC | PRN
  Filled 2019-08-20: qty 250

## 2019-08-20 MED ORDER — DIPHENHYDRAMINE HCL 50 MG/ML IJ SOLN: 12.5000 mg | INTRAMUSCULAR | Status: DC | PRN

## 2019-08-20 MED ORDER — OXYCODONE-ACETAMINOPHEN 5-325 MG PO TABS: 2.0000 | ORAL_TABLET | ORAL | Status: DC | PRN

## 2019-08-20 MED ORDER — LACTATED RINGERS IV SOLN: INTRAVENOUS | Status: DC

## 2019-08-20 MED ORDER — TERBUTALINE SULFATE 1 MG/ML IJ SOLN
0.2500 mg | Freq: Once | INTRAMUSCULAR | Status: AC | PRN
  Administered 2019-08-21: 0.25 mg via SUBCUTANEOUS
  Filled 2019-08-20: qty 1

## 2019-08-20 MED ORDER — OXYTOCIN 40 UNITS IN NORMAL SALINE INFUSION - SIMPLE MED: 2.5000 [IU]/h | INTRAVENOUS | Status: DC

## 2019-08-20 MED ORDER — MISOPROSTOL 50MCG HALF TABLET
50.0000 ug | ORAL_TABLET | ORAL | Status: DC | PRN
  Administered 2019-08-20 (×3): 50 ug via BUCCAL
  Filled 2019-08-20 (×3): qty 1

## 2019-08-20 MED ORDER — LIDOCAINE HCL (PF) 1 % IJ SOLN: 30.0000 mL | INTRAMUSCULAR | Status: DC | PRN

## 2019-08-20 NOTE — Progress Notes (Signed)
Subjective: Pt doing well. Comfortable. No pain or contractions.   Objective: BP 112/67   Pulse 100   Temp 98 F (36.7 C) (Oral)   Resp 16   Ht 5\' 6"  (1.676 m)   Wt 90.1 kg   LMP 11/24/2018   BMI 32.05 kg/m    Fetal Well-Being: - FHR 135, moderate variability, +accels, no decels  Toco: - Contractions rare  Dilation: Fingertip Effacement (%): Thick Station: -3 Presentation: Vertex Exam by:: [email or web address] RNC  Assessment and Plan: 24 y.o. G1P0 [redacted]w[redacted]d admitted for induction of labor for postdates  Labor:  - Buccal cytotec x1 given - Cook catheter placed with speculum - PCN for GBS - Pain control: planning epidural - PPH Risk: low  [redacted]w[redacted]d, SNM 11:45 AM

## 2019-08-20 NOTE — H&P (Signed)
OBSTETRIC ADMISSION HISTORY AND PHYSICAL  Frankee Reed-Cummings is a 24 y.o. female G1P0 with IUP at [redacted]w[redacted]d by [place] at 25+5, EDD 08/13/19, (LMP EDD 08/31/19)presenting for IOL for PD. She reports +FMs. No LOF, VB, blurry vision, headaches, peripheral edema, or RUQ pain. She plans on breastfeeding. She requests Nexplanon for birth control.  Dating: By [place] at 25+5 weeks --->  Estimated Date of Delivery: 08/13/19  Sono:   @[redacted]w[redacted]d , complete normal anatomy, 816g, 30%ile  Prenatal History/Complications:  Positive chlamydia 11/23/18, 01/31/19  HSV (valtrex started during her 37th week)  Alpha thal silent carrier  Sickle cell trait   Hx substance abuse (2017)  Adjustment disorder with mixed anxiety and depressed mood  Past Medical History: Past Medical History:  Diagnosis Date  . Genital herpes   . Hx of chlamydia infection 2017  . Hx of trichomoniasis 2017    Past Surgical History: Past Surgical History:  Procedure Laterality Date  . NO PAST SURGERIES      Obstetrical History: OB History    Gravida  1   Para      Term      Preterm      AB      Living        SAB      TAB      Ectopic      Multiple      Live Births              Social History: Social History   Socioeconomic History  . Marital status: Single    Spouse name: Not on file  . Number of children: Not on file  . Years of education: Not on file  . Highest education level: Not on file  Occupational History  . Not on file  Tobacco Use  . Smoking status: Never Smoker  . Smokeless tobacco: Never Used  Substance and Sexual Activity  . Alcohol use: Not Currently    Comment: ocassionally  . 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 Sexual activity: Yes    Birth control/protection: None  Other Topics Concern  . Not on file  Social History Narrative   ** Merged History Encounter **        Social Determinants of Health   Financial Resource Strain:   . Difficulty of Paying Living Expenses: Not on file  Food Insecurity: No Food Insecurity  . Worried About December 2019 in the Last Year: Never true  . Ran Out of Food in the Last Year: Never true  Transportation Needs: No Transportation Needs  . Lack of Transportation (Medical): No  . Lack of Transportation (Non-Medical): No  Physical Activity:   . Days of Exercise per Week: Not on file  . Minutes of Exercise per Session: Not on file  Stress:   . Feeling of Stress : Not on file  Social Connections:   . Frequency of Communication with Friends and Family: Not on file  . Frequency of Social Gatherings with Friends and Family: Not on file  . Attends Religious Services: Not on file  . Active Member of Clubs or Organizations: Not on file  . Attends Programme researcher, broadcasting/film/video Meetings: Not on file  . Marital Status: Not on file    Family History: Family History  Problem Relation Age of Onset  . Asthma Mother   . Healthy Father  Allergies: No Known Allergies  No medications prior to admission.    Review of Systems:  All systems reviewed and negative except as stated in HPI  PE: Last menstrual period 11/24/2018. General appearance: alert, cooperative and no distress Lungs: regular rate and effort Heart: regular rate  Abdomen: soft, non-tender Extremities: Homans sign is negative, no sign of DVT Presentation: cephalic EFM: 135 bpm, moderate variability, 15x15 accels, non decels Toco: rare    Prenatal labs: ABO, Rh: O/Positive/-- (06/30 1429) Antibody: Negative (06/30 1429) Rubella: 4.50 (06/30 1429) RPR: Non Reactive (10/01 0910)  HBsAg: Negative (06/30 1429)  HIV: Non Reactive (10/01 0910)  GBS: Positive/-- (12/01 1503)  2 hr GTT:   Ref. Range 05/15/2019 09:10  Glucose, 1 hour Latest Ref Range: 65 - 179 mg/dL 88  Glucose, Fasting Latest Ref Range: 65 - 91 mg/dL 84  Glucose, 2 hour Latest Ref  Range: 65 - 152 mg/dL 78    Prenatal Transfer Tool  Maternal Diabetes: No Genetic Screening: Normal Maternal Ultrasounds/Referrals: Normal Fetal Ultrasounds or other Referrals:  None Maternal Substance Abuse:  Yes:  Type: Other: hx of in 2017 Significant Maternal Medications:  Meds include: Other: valtrex Significant Maternal Lab Results: Group B Strep positive    Nursing Staff Provider  Office Location  [facility] Dating  LMP  Language   English Anatomy US   normal female with f/up for non-visualized anatomy  Flu Vaccine  Declines Genetic Screen  NIPS: Low risk, female  AFP: screen negative  TDaP vaccine   07/01/19  Hgb A1C or  GTT  Third trimester WNL  Glucose, Fasting 65 - 91 mg/dL 84   Glucose, 1 hour 65 - 179 mg/dL 88   Glucose, 2 hour 65 - 152 mg/dL 78    Rhogam  NA, O+   LAB RESULTS   Feeding Plan Breast  Blood Type O/Positive/-- (06/30 1429)   Contraception nexplanon (inpt) Antibody Negative (06/30 1429)  Circumcision  yes Rubella 4.50 (06/30 1429)  Pediatrician   list given 10/20 RPR Non Reactive (06/30 1429)   Support Person Fidela Salisbury (boyfriend) HBsAg Negative (06/30 1429)   Prenatal Classes  HIV Non Reactive (06/30 1429)  BTL Consent NA GBS  POSITIVE   VBAC Consent  NA Pap 02/11/2019 NIL     Hgb Electro  Silent carrier Alpha-Thalassemia Sickle Cell trait   BP Cuff  CF Negative     SMA Negative     Waterbirth  [ ]  Class [ ]  Consent [ ]  CNM visit    No results found for this or any previous visit (from the past 24 hour(s)).  Patient Active Problem List   Diagnosis Date Noted  . GBS bacteriuria 07/25/2019  . GBS (group B Streptococcus carrier), +RV culture, currently pregnant 07/22/2019  . Alpha thalassemia silent carrier 05/15/2019  . Sickle cell trait (HCC) 05/15/2019  . Genital herpes affecting pregnancy 05/15/2019  . Supervision of other normal pregnancy, antepartum 01/27/2019  . Substance abuse (HCC) 11/11/2015  . Adjustment disorder with mixed  anxiety and depressed mood 11/11/2015  . ECZEMA 05/27/2008    Assessment: Keimani Reed-Cummings is a 24 y.o. G1P0 at [redacted]w[redacted]d here for IOL for PD. 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   1. Labor: Explained IOL procedure in depth with patient. She expressed understanding. Will start with buccal cytotec.  2. FWB: Category 1 3. Pain: planning an epidural  4.  GBS: + with RV + culture -    Plan: Admit to LD Start with cytotec Plan for FB when able  PCN for GBS    [place], CNM  08/20/19  10:53 AM

## 2019-08-20 NOTE — Progress Notes (Signed)
Subjective: Pt reports having painful contractions. She is coping and breathing well through contractions. Did receive a dose of IV pain medication which is helping with her pain.   Objective: BP 102/62   Pulse 87   Temp 98 F (36.7 C) (Oral)   Resp 18   Ht 5\' 6"  (1.676 m)   Wt 90.1 kg   LMP 11/24/2018   BMI 32.05 kg/m   Fetal Well Being: - FHR 130, moderate variability, +accels, no decels  Toco: - contractions q2-5 minutes  Dilation: Fingertip Effacement (%): Thick Station: -3 Presentation: Vertex Exam by:: [email or web address] RNC  Assessment and Plan: 24 y.o. G1P0 [redacted]w[redacted]d admitted for induction of labor for postdates  Labor:  - Foley balloon still in place. Continue cytotec. - PCN for GBS - Pain control: IV pain medication, but planning epidural   [redacted]w[redacted]d, SNM 4:10 PM

## 2019-08-20 NOTE — Progress Notes (Signed)
Stacey Rodriguez is a 24 y.o. female G1P0 with IUP at [redacted]w[redacted]d by [place] at 25+5, EDD 08/13/19, (LMP EDD 08/31/19)presenting for IOL for PD.  Subjective: Would like an epidural at this time.   Objective: BP 112/66   Pulse 95   Temp 98 F (36.7 C) (Oral)   Resp 18   Ht 5\' 6"  (1.676 m)   Wt 90.1 kg   LMP 11/24/2018   BMI 32.05 kg/m  No intake/output data recorded. No intake/output data recorded.  FHT:  FHR: 135 bpm, variability: moderate,  accelerations:  Abscent,  decelerations:  Absent UC:   irregular, every 1-4 minutes SVE:   Dilation: 5.5 Effacement (%): 60 Station: -2 Exam by:: [email or web address], RN   Labs: Lab Results  Component Value Date   WBC 7.6 08/20/2019   HGB 11.1 (L) 08/20/2019   HCT 33.6 (L) 08/20/2019   MCV 86.6 08/20/2019   PLT 204 08/20/2019    Assessment / Plan: Stacey Rodriguez is a 24 y.o. G1P0 at [redacted]w[redacted]d here for IOL for PD. 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   Labor: IOL. S/p FB, cytotec x 3 (2047) Fetal Wellbeing:  Category I Pain Control:  per patient request I/D:  GBS + PCN Anticipated MOD:  VD  MOF: breast  MOC: nexplanon prior to discharge  12-06-1982 08/20/2019, 10:03 PM

## 2019-08-20 NOTE — Anesthesia Preprocedure Evaluation (Signed)
Anesthesia Evaluation  Patient identified by MRN, date of birth, ID band Patient awake    Reviewed: Allergy & Precautions, Patient's Chart, lab work & pertinent test results  History of Anesthesia Complications Negative for: history of anesthetic complications  Airway Mallampati: II  TM Distance: >3 FB Neck ROM: Full    Dental no notable dental hx.    Pulmonary neg pulmonary ROS,    Pulmonary exam normal        Cardiovascular negative cardio ROS Normal cardiovascular exam     Neuro/Psych negative neurological ROS     GI/Hepatic negative GI ROS, Neg liver ROS,   Endo/Other  negative endocrine ROS  Renal/GU negative Renal ROS  negative genitourinary   Musculoskeletal negative musculoskeletal ROS (+)   Abdominal   Peds  Hematology negative hematology ROS (+)   Anesthesia Other Findings Day of surgery medications reviewed with patient.  Reproductive/Obstetrics (+) Pregnancy                             Anesthesia Physical Anesthesia Plan  ASA: II  Anesthesia Plan: Epidural   Post-op Pain Management:    Induction:   PONV Risk Score and Plan: Treatment may vary due to age or medical condition  Airway Management Planned: Natural Airway  Additional Equipment:   Intra-op Plan:   Post-operative Plan:   Informed Consent: I have reviewed the patients History and Physical, chart, labs and discussed the procedure including the risks, benefits and alternatives for the proposed anesthesia with the patient or authorized representative who has indicated his/her understanding and acceptance.       Plan Discussed with:   Anesthesia Plan Comments:         Anesthesia Quick Evaluation

## 2019-08-21 ENCOUNTER — Encounter (HOSPITAL_COMMUNITY): Payer: Self-pay | Admitting: Student

## 2019-08-21 ENCOUNTER — Encounter (HOSPITAL_COMMUNITY)
Admission: AD | Disposition: A | Discharge status: Home / Self Care | Payer: Self-pay | Source: Home / Self Care | Attending: Obstetrics & Gynecology

## 2019-08-21 DIAGNOSIS — O480 Post-term pregnancy: Secondary | ICD-10-CM

## 2019-08-21 DIAGNOSIS — Z3A41 41 weeks gestation of pregnancy: Secondary | ICD-10-CM

## 2019-08-21 DIAGNOSIS — O99824 Streptococcus B carrier state complicating childbirth: Secondary | ICD-10-CM

## 2019-08-21 LAB — CREATININE, SERUM
Creatinine, Ser: 0.57 mg/dL (ref 0.44–1.00)
GFR calc Af Amer: >60 mL/min (ref >60)
GFR calc non Af Amer: >60 mL/min (ref >60)

## 2019-08-21 LAB — CBC
HCT: 29.7 % — ABNORMAL LOW (ref 36.0–46.0)
Hemoglobin: 9.9 g/dL — ABNORMAL LOW (ref 12.0–15.0)
MCH: 28.9 pg (ref 26.0–34.0)
MCHC: 33.3 g/dL (ref 30.0–36.0)
MCV: 86.8 fL (ref 80.0–100.0)
Platelets: 174 10*3/uL (ref 150–400)
RBC: 3.42 MIL/uL — ABNORMAL LOW (ref 3.87–5.11)
RDW: 13.9 % (ref 11.5–15.5)
WBC: 16.7 10*3/uL — ABNORMAL HIGH (ref 4.0–10.5)
nRBC: 0 % (ref 0.0–0.2)

## 2019-08-21 SURGERY — Surgical Case
Anesthesia: Epidural | Wound class: Clean Contaminated

## 2019-08-21 MED ORDER — WITCH HAZEL-GLYCERIN EX PADS: 1.0000 "application " | MEDICATED_PAD | CUTANEOUS | Status: DC | PRN

## 2019-08-21 MED ORDER — NALOXONE HCL 0.4 MG/ML IJ SOLN: 0.4000 mg | INTRAMUSCULAR | Status: DC | PRN

## 2019-08-21 MED ORDER — CEFAZOLIN SODIUM-DEXTROSE 2-4 GM/100ML-% IV SOLN
INTRAVENOUS | Status: AC
  Filled 2019-08-21: qty 100

## 2019-08-21 MED ORDER — OXYTOCIN 40 UNITS IN NORMAL SALINE INFUSION - SIMPLE MED
INTRAVENOUS | Status: DC | PRN
  Administered 2019-08-21: 500 mL via INTRAVENOUS

## 2019-08-21 MED ORDER — ONDANSETRON HCL 4 MG/2ML IJ SOLN: 4.0000 mg | Freq: Once | INTRAMUSCULAR | Status: DC | PRN

## 2019-08-21 MED ORDER — LACTATED RINGERS IV SOLN: INTRAVENOUS | Status: DC

## 2019-08-21 MED ORDER — OXYTOCIN 40 UNITS IN NORMAL SALINE INFUSION - SIMPLE MED
1.0000 m[IU]/min | INTRAVENOUS | Status: DC
  Administered 2019-08-21: 2 m[IU]/min via INTRAVENOUS
  Filled 2019-08-21: qty 1000

## 2019-08-21 MED ORDER — SCOPOLAMINE 1 MG/3DAYS TD PT72: 1.0000 | MEDICATED_PATCH | Freq: Once | TRANSDERMAL | Status: DC

## 2019-08-21 MED ORDER — DEXAMETHASONE SODIUM PHOSPHATE 10 MG/ML IJ SOLN
INTRAMUSCULAR | Status: AC
  Filled 2019-08-21: qty 1

## 2019-08-21 MED ORDER — ONDANSETRON HCL 4 MG/2ML IJ SOLN: 4.0000 mg | Freq: Three times a day (TID) | INTRAMUSCULAR | Status: DC | PRN

## 2019-08-21 MED ORDER — NALBUPHINE HCL 10 MG/ML IJ SOLN: 5.0000 mg | INTRAMUSCULAR | Status: DC | PRN

## 2019-08-21 MED ORDER — NALBUPHINE HCL 10 MG/ML IJ SOLN: 5.0000 mg | Freq: Once | INTRAMUSCULAR | Status: DC | PRN

## 2019-08-21 MED ORDER — SODIUM CHLORIDE 0.9 % IV SOLN
500.0000 mg | INTRAVENOUS | Status: AC
  Administered 2019-08-21: 500 mg via INTRAVENOUS

## 2019-08-21 MED ORDER — KETOROLAC TROMETHAMINE 30 MG/ML IJ SOLN
30.0000 mg | Freq: Once | INTRAMUSCULAR | Status: AC | PRN
  Administered 2019-08-21: 30 mg via INTRAVENOUS

## 2019-08-21 MED ORDER — MORPHINE SULFATE (PF) 0.5 MG/ML IJ SOLN
INTRAMUSCULAR | Status: DC | PRN
  Administered 2019-08-21: 3 mg via EPIDURAL

## 2019-08-21 MED ORDER — HYDROMORPHONE HCL 1 MG/ML IJ SOLN
INTRAMUSCULAR | Status: AC
  Filled 2019-08-21: qty 0.5

## 2019-08-21 MED ORDER — TETANUS-DIPHTH-ACELL PERTUSSIS 5-2.5-18.5 LF-MCG/0.5 IM SUSP: 0.5000 mL | Freq: Once | INTRAMUSCULAR | Status: DC

## 2019-08-21 MED ORDER — DIPHENHYDRAMINE HCL 25 MG PO CAPS: 25.0000 mg | ORAL_CAPSULE | ORAL | Status: DC | PRN

## 2019-08-21 MED ORDER — ONDANSETRON HCL 4 MG/2ML IJ SOLN
INTRAMUSCULAR | Status: AC
  Filled 2019-08-21: qty 2

## 2019-08-21 MED ORDER — HYDROMORPHONE HCL 1 MG/ML IJ SOLN
0.2500 mg | INTRAMUSCULAR | Status: DC | PRN
  Administered 2019-08-21 (×2): 0.5 mg via INTRAVENOUS

## 2019-08-21 MED ORDER — SIMETHICONE 80 MG PO CHEW
80.0000 mg | CHEWABLE_TABLET | ORAL | Status: DC
  Administered 2019-08-22 – 2019-08-23 (×2): 80 mg via ORAL
  Filled 2019-08-21 (×2): qty 1

## 2019-08-21 MED ORDER — ONDANSETRON HCL 4 MG/2ML IJ SOLN
INTRAMUSCULAR | Status: DC | PRN
  Administered 2019-08-21: 4 mg via INTRAVENOUS

## 2019-08-21 MED ORDER — DIBUCAINE (PERIANAL) 1 % EX OINT: 1.0000 "application " | TOPICAL_OINTMENT | CUTANEOUS | Status: DC | PRN

## 2019-08-21 MED ORDER — CEFAZOLIN SODIUM-DEXTROSE 2-4 GM/100ML-% IV SOLN
2.0000 g | INTRAVENOUS | Status: AC
  Administered 2019-08-21: 2 g via INTRAVENOUS

## 2019-08-21 MED ORDER — PRENATAL MULTIVITAMIN CH
1.0000 | ORAL_TABLET | Freq: Every day | ORAL | Status: DC
  Administered 2019-08-22: 1 via ORAL
  Filled 2019-08-21: qty 1

## 2019-08-21 MED ORDER — ENOXAPARIN SODIUM 40 MG/0.4ML ~~LOC~~ SOLN
40.0000 mg | SUBCUTANEOUS | Status: DC
  Administered 2019-08-22 – 2019-08-23 (×2): 40 mg via SUBCUTANEOUS
  Filled 2019-08-21 (×2): qty 0.4

## 2019-08-21 MED ORDER — MORPHINE SULFATE (PF) 0.5 MG/ML IJ SOLN
INTRAMUSCULAR | Status: AC
  Filled 2019-08-21: qty 10

## 2019-08-21 MED ORDER — OXYTOCIN 40 UNITS IN NORMAL SALINE INFUSION - SIMPLE MED: 2.5000 [IU]/h | INTRAVENOUS | Status: AC

## 2019-08-21 MED ORDER — DIPHENHYDRAMINE HCL 25 MG PO CAPS: 25.0000 mg | ORAL_CAPSULE | Freq: Four times a day (QID) | ORAL | Status: DC | PRN

## 2019-08-21 MED ORDER — ALBUTEROL SULFATE (2.5 MG/3ML) 0.083% IN NEBU: 3.0000 mL | INHALATION_SOLUTION | Freq: Four times a day (QID) | RESPIRATORY_TRACT | Status: DC | PRN

## 2019-08-21 MED ORDER — SODIUM CHLORIDE 0.9% FLUSH: 3.0000 mL | INTRAVENOUS | Status: DC | PRN

## 2019-08-21 MED ORDER — SIMETHICONE 80 MG PO CHEW
80.0000 mg | CHEWABLE_TABLET | Freq: Three times a day (TID) | ORAL | Status: DC
  Administered 2019-08-21 – 2019-08-23 (×5): 80 mg via ORAL
  Filled 2019-08-21 (×5): qty 1

## 2019-08-21 MED ORDER — SODIUM CHLORIDE (PF) 0.9 % IJ SOLN
INTRAMUSCULAR | Status: DC | PRN
  Administered 2019-08-20: 12 mL/h via EPIDURAL

## 2019-08-21 MED ORDER — KETOROLAC TROMETHAMINE 30 MG/ML IJ SOLN
INTRAMUSCULAR | Status: AC
  Filled 2019-08-21: qty 1

## 2019-08-21 MED ORDER — ACETAMINOPHEN 325 MG PO TABS
650.0000 mg | ORAL_TABLET | Freq: Four times a day (QID) | ORAL | Status: DC | PRN
  Administered 2019-08-21 – 2019-08-22 (×2): 650 mg via ORAL
  Filled 2019-08-21 (×2): qty 2

## 2019-08-21 MED ORDER — SODIUM BICARBONATE 8.4 % IV SOLN
INTRAVENOUS | Status: DC | PRN
  Administered 2019-08-21 (×2): 5 mL via EPIDURAL

## 2019-08-21 MED ORDER — OXYCODONE HCL 5 MG PO TABS: 5.0000 mg | ORAL_TABLET | Freq: Once | ORAL | Status: DC | PRN

## 2019-08-21 MED ORDER — SENNOSIDES-DOCUSATE SODIUM 8.6-50 MG PO TABS
2.0000 | ORAL_TABLET | ORAL | Status: DC
  Administered 2019-08-22 – 2019-08-23 (×2): 2 via ORAL
  Filled 2019-08-21 (×2): qty 2

## 2019-08-21 MED ORDER — DIPHENHYDRAMINE HCL 50 MG/ML IJ SOLN
INTRAMUSCULAR | Status: AC
  Filled 2019-08-21: qty 1

## 2019-08-21 MED ORDER — OXYTOCIN 40 UNITS IN NORMAL SALINE INFUSION - SIMPLE MED
INTRAVENOUS | Status: AC
  Filled 2019-08-21: qty 1000

## 2019-08-21 MED ORDER — DIPHENHYDRAMINE HCL 50 MG/ML IJ SOLN
INTRAMUSCULAR | Status: DC | PRN
  Administered 2019-08-21: 12.5 mg via INTRAVENOUS

## 2019-08-21 MED ORDER — LIDOCAINE-EPINEPHRINE (PF) 2 %-1:200000 IJ SOLN
INTRAMUSCULAR | Status: AC
  Filled 2019-08-21: qty 10

## 2019-08-21 MED ORDER — NALOXONE HCL 4 MG/10ML IJ SOLN
1.0000 ug/kg/h | INTRAVENOUS | Status: DC | PRN
  Filled 2019-08-21: qty 5

## 2019-08-21 MED ORDER — SIMETHICONE 80 MG PO CHEW: 80.0000 mg | CHEWABLE_TABLET | ORAL | Status: DC | PRN

## 2019-08-21 MED ORDER — DEXAMETHASONE SODIUM PHOSPHATE 10 MG/ML IJ SOLN
INTRAMUSCULAR | Status: DC | PRN
  Administered 2019-08-21: 10 mg via INTRAVENOUS

## 2019-08-21 MED ORDER — LIDOCAINE HCL (PF) 1 % IJ SOLN
INTRAMUSCULAR | Status: DC | PRN
  Administered 2019-08-20: 4 mL via EPIDURAL

## 2019-08-21 MED ORDER — IBUPROFEN 800 MG PO TABS
800.0000 mg | ORAL_TABLET | Freq: Three times a day (TID) | ORAL | Status: DC
  Administered 2019-08-21 – 2019-08-23 (×6): 800 mg via ORAL
  Filled 2019-08-21 (×6): qty 1

## 2019-08-21 MED ORDER — GABAPENTIN 100 MG PO CAPS
100.0000 mg | ORAL_CAPSULE | Freq: Three times a day (TID) | ORAL | Status: DC
  Administered 2019-08-21 – 2019-08-23 (×5): 100 mg via ORAL
  Filled 2019-08-21 (×5): qty 1

## 2019-08-21 MED ORDER — OXYCODONE HCL 5 MG/5ML PO SOLN: 5.0000 mg | Freq: Once | ORAL | Status: DC | PRN

## 2019-08-21 MED ORDER — COCONUT OIL OIL: 1.0000 "application " | TOPICAL_OIL | Status: DC | PRN

## 2019-08-21 MED ORDER — OXYCODONE HCL 5 MG PO TABS
5.0000 mg | ORAL_TABLET | ORAL | Status: DC | PRN
  Administered 2019-08-22 (×4): 10 mg via ORAL
  Administered 2019-08-23: 5 mg via ORAL
  Administered 2019-08-23: 10 mg via ORAL
  Filled 2019-08-21 (×5): qty 2
  Filled 2019-08-21: qty 1

## 2019-08-21 MED ORDER — MENTHOL 3 MG MT LOZG: 1.0000 | LOZENGE | OROMUCOSAL | Status: DC | PRN

## 2019-08-21 MED ORDER — SODIUM CHLORIDE 0.9 % IV SOLN
INTRAVENOUS | Status: AC
  Filled 2019-08-21: qty 500

## 2019-08-21 MED ORDER — DIPHENHYDRAMINE HCL 50 MG/ML IJ SOLN: 12.5000 mg | INTRAMUSCULAR | Status: DC | PRN

## 2019-08-21 SURGICAL SUPPLY — 31 items
APL SKNCLS STERI-STRIP NONHPOA (GAUZE/BANDAGES/DRESSINGS) ×1
BENZOIN TINCTURE PRP APPL 2/3 (GAUZE/BANDAGES/DRESSINGS) ×1 IMPLANT
BRR ADH 6X5 SEPRAFILM 1 SHT (MISCELLANEOUS)
CHLORAPREP W/TINT 26ML (MISCELLANEOUS) ×2 IMPLANT
CLAMP CORD UMBIL (MISCELLANEOUS) IMPLANT
CLOSURE STERI STRIP 1/2 X4 (GAUZE/BANDAGES/DRESSINGS) ×1 IMPLANT
DRSG OPSITE POSTOP 4X10 (GAUZE/BANDAGES/DRESSINGS) ×2 IMPLANT
ELECT REM PT RETURN 9FT ADLT (ELECTROSURGICAL) ×2
ELECTRODE REM PT RTRN 9FT ADLT (ELECTROSURGICAL) ×1 IMPLANT
EXTRACTOR VACUUM M CUP 4 TUBE (SUCTIONS) IMPLANT
GLOVE BIOGEL PI IND STRL 6.5 (GLOVE) ×1 IMPLANT
GLOVE BIOGEL PI IND STRL 7.0 (GLOVE) ×1 IMPLANT
GLOVE BIOGEL PI INDICATOR 6.5 (GLOVE) ×1
GLOVE BIOGEL PI INDICATOR 7.0 (GLOVE) ×1
GLOVE SURG SS PI 6.0 STRL IVOR (GLOVE) ×2 IMPLANT
GOWN STRL REUS W/TWL LRG LVL3 (GOWN DISPOSABLE) ×4 IMPLANT
KIT ABG SYR 3ML LUER SLIP (SYRINGE) IMPLANT
NDL HYPO 25X5/8 SAFETYGLIDE (NEEDLE) IMPLANT
NEEDLE HYPO 25X5/8 SAFETYGLIDE (NEEDLE) IMPLANT
NS IRRIG 1000ML POUR BTL (IV SOLUTION) ×2 IMPLANT
PACK C SECTION WH (CUSTOM PROCEDURE TRAY) ×2 IMPLANT
PAD OB MATERNITY 4.3X12.25 (PERSONAL CARE ITEMS) ×2 IMPLANT
PENCIL SMOKE EVAC W/HOLSTER (ELECTROSURGICAL) ×2 IMPLANT
RTRCTR C-SECT PINK 25CM LRG (MISCELLANEOUS) IMPLANT
SEPRAFILM MEMBRANE 5X6 (MISCELLANEOUS) IMPLANT
SUT PLAIN 0 NONE (SUTURE) IMPLANT
SUT VIC AB 0 CT1 36 (SUTURE) ×8 IMPLANT
SUT VIC AB 4-0 KS 27 (SUTURE) ×2 IMPLANT
TOWEL OR 17X24 6PK STRL BLUE (TOWEL DISPOSABLE) ×2 IMPLANT
TRAY FOLEY W/BAG SLVR 14FR LF (SET/KITS/TRAYS/PACK) ×2 IMPLANT
WATER STERILE IRR 1000ML POUR (IV SOLUTION) ×2 IMPLANT

## 2019-08-21 NOTE — Progress Notes (Signed)
Went bedside with Dr. Jolayne Panther. Recurrent lates have recurred with Pitocin only at 2. Patient has been 5-6 cm since about 2200 last night with no cervical change at this exam. Unable to augment due to [facility] with Pitocin and not making cervical change on her own. Will proceed with c-section. The risks of cesarean section discussed with the patient included but were not limited to: bleeding which may require transfusion or reoperation; infection which may require antibiotics; injury to bowel, bladder, ureters or other surrounding organs; injury to the fetus; need for additional procedures including hysterectomy in the event of a life-threatening hemorrhage; placental abnormalities wth subsequent pregnancies, incisional problems, thromboembolic phenomenon and other postoperative/anesthesia complications. The patient concurred with the proposed plan, giving informed written consent for the procedure.   Patient has been NPO since midnight she will remain NPO for procedure. Anesthesia and OR aware. Preoperative prophylactic antibiotics and SCDs ordered on call to the OR.  To OR when ready.  Jerilynn Birkenhead, MD [facility] Family Medicine Fellow, [facility] for [employer], [facility]

## 2019-08-21 NOTE — Progress Notes (Signed)
Pt found out of bed after being told I will walk with her after she gets pain med.  States she forgot.  Instructed her to not get up without help   States she understands

## 2019-08-21 NOTE — Discharge Summary (Signed)
Postpartum Discharge Summary     Patient Name: Stacey Rodriguez DOB: 01-Jun-1996 MRN: [id number]  Date of admission: 08/20/2019 Delivering Provider: CONSTANT, PEGGY   Date of discharge: 08/23/2019  Admitting diagnosis: Indication for care in labor and delivery, antepartum [O75.9] Intrauterine pregnancy: [redacted]w[redacted]d    Secondary diagnosis:  Active Problems:   Genital herpes affecting pregnancy   GBS bacteriuria   Indication for care in labor and delivery, antepartum   [redacted] weeks gestation of pregnancy  Additional problems: None     Discharge diagnosis: Term Pregnancy Delivered                                                                                                Post partum procedures:none  Augmentation: AROM, Pitocin, Cytotec and Foley Balloon  Complications: None  Hospital course:  Induction of Labor With Cesarean Section  24y.o. yo G1P1001 at 423w1das admitted to the hospital 08/20/2019 for induction of labor. Patient had a labor course significant for IOL for Post-dates. Patient received Cytotec, Foley balloon, AROM and Pitocin. She was unchanged for 24 hours with NRFHT when Pitocin started. The patient went for cesarean section due to Non-Reassuring FHR, and delivered a Viable infant,08/21/2019  Membrane Rupture Time/Date: 3:11 AM ,08/21/2019   Details of operation can be found in separate operative Note.  Patient had an uncomplicated postpartum course. Nexplanon placed. She is ambulating, tolerating a regular diet, passing flatus, and urinating well.  Patient is discharged home in stable condition on 08/23/19.                                   Delivery time: 12:20 PM    Magnesium Sulfate received: No BMZ received: No Rhophylac:No MMR:No Transfusion:No  Physical exam  Vitals:   08/22/19 0258 08/22/19 0616 08/22/19 0905 08/23/19 0544  BP: (!) 95/54 (!) 102/55 101/66 105/62  Pulse: 80 73 63 68  Resp: '17 18 20 18  ' Temp: 97.7 F (36.5 C) 97.9 F (36.6 C) 97.7 F (36.5  C) 98 F (36.7 C)  TempSrc: Oral Oral Oral Oral  SpO2: 100% 98% 99% 98%  Weight:      Height:       General: alert, cooperative and no distress Lochia: appropriate Uterine Fundus: firm Incision: Healing well with no significant drainage DVT Evaluation: No evidence of DVT seen on physical exam. Labs: Lab Results  Component Value Date   WBC 16.6 (H) 08/22/2019   HGB 8.3 (L) 08/22/2019   HCT 25.4 (L) 08/22/2019   MCV 88.5 08/22/2019   PLT 172 08/22/2019   CMP Latest Ref Rng & Units 08/21/2019  Glucose 70 - 99 mg/dL -  BUN 6 - 20 mg/dL -  Creatinine 0.44 - 1.00 mg/dL 0.57  Sodium 135 - 145 mmol/L -  Potassium 3.5 - 5.1 mmol/L -  Chloride 98 - 111 mmol/L -  CO2 22 - 32 mmol/L -  Calcium 8.9 - 10.3 mg/dL -  Total Protein 6.5 - 8.1 g/dL -  Total Bilirubin 0.3 - 1.2 mg/dL -  Alkaline Phos 38 - 126 U/L -  AST 15 - 41 U/L -  ALT 0 - 44 U/L -    Discharge instruction: per After Visit Summary and "Baby and Me Booklet".  After visit meds:  Allergies as of 08/23/2019   No Known Allergies     Medication List    STOP taking these medications   hydrocortisone cream 1 %   valACYclovir 500 MG tablet Commonly known as: VALTREX     TAKE these medications   acetaminophen 325 MG tablet Commonly known as: TYLENOL Take 2 tablets (650 mg total) by mouth every 6 (six) hours as needed for moderate pain.   albuterol 108 (90 Base) MCG/ACT inhaler Commonly known as: VENTOLIN HFA Inhale 1-2 puffs into the lungs every 6 (six) hours as needed for wheezing or shortness of breath.   AMBULATORY NON FORMULARY MEDICATION 1 Device by Other route once a week. Medication Name: Blood Pressure Cuff Monitored regularly at home  ICD 10 O09.90   ibuprofen 800 MG tablet Commonly known as: ADVIL Take 1 tablet (800 mg total) by mouth every 8 (eight) hours.   oxyCODONE 5 MG immediate release tablet Commonly known as: Oxy IR/ROXICODONE Take 1-2 tablets (5-10 mg total) by mouth every 4 (four) hours  as needed for moderate pain.   PRENATAL VITAMINS PO Take 1 tablet by mouth daily.   senna-docusate 8.6-50 MG tablet Commonly known as: Senokot-S Take 2 tablets by mouth daily. Start taking on: August 24, 2019       Diet: routine diet  Activity: Advance as tolerated. Pelvic rest for 6 weeks.   Outpatient follow up:4 weeks Follow up Appt: Future Appointments  Date Time Provider [place]  09/04/2019  1:30 PM Dellroy [place]  09/23/2019  8:55 AM Darlina Rumpf, CNM [facility]   Follow up Visit:   Please schedule this patient for Postpartum visit in: 4 weeks with the following provider: Any provider For C/S patients schedule nurse incision check in weeks 2 weeks: yes Low risk pregnancy complicated by: none Delivery mode:  CS Anticipated Birth Control:  Nexplanon PP Procedures needed: Incision check  Schedule Integrated BH visit: no   Newborn Data: Live born female  Birth Weight: 3650g  APGAR: 41, 9  Newborn Delivery   Birth date/time: 08/21/2019 12:20:00 Delivery type: C-Section, Low Transverse Trial of labor: Yes C-section categorization: Primary      Baby Feeding: Breast Disposition:home with mother   08/23/2019 Chauncey Mann, MD

## 2019-08-21 NOTE — Lactation Note (Signed)
This note was copied from a baby's chart. Lactation Consultation Note  Patient Name: Stacey Rodriguez Today's Date: 08/21/2019 Reason for consult: Initial assessment;Primapara;1st time breastfeeding;Term  8 hours old FT female who is still being exclusively BF by his mother, she's a P1. Mom reported moderate breast changes during the pregnancy. She participated in the [facility] program at the [facility] and she's already familiar with hand expression and able to get drops of colostrum when doing so, her RN helped her earlier and [facility] also assisted with some hand expression when she had baby at the breast.  Baby already nursing when entering the room, her RN has set mom up with a NS # 20 because it seemed like baby was too sleepy to eat earlier on, mom's nipples are everted and her tissue is very compressible. Noticed that baby had a shallow latch, LC repositioned baby and showed mom how to get a deep latch to prevent sore nipples. Baby self released from the breast at the 10 minutes mark, but by them mom said she didn't want to try again and that she didn't want to BF.  Offered to set up a pump in case she'd consider to pump and bottle feed, but she said she just want to do formula, and requested for her RN to be called. LC called the front desk but RN was in a different room and unavailable at a time. Mom asked again for her RN to be called "right away". She had an unfriendly attitude the entire time during [facility] consultation, LC tried to be supportive of her feeding choice and let her know that whether she does breast or formula, we'll always support her decision as long as baby is getting fed. Reviewed normal newborn behavior and feeding cues.  Feeding plan:  1. Mom will start feeding baby formula tonight, she asked for Enfamil during [facility] consultation 2.  If she changes her mind about feeding choice, she'll notify RN  BF brochure and feeding diary were left in the room prior exiting. Parents understand that  whether they do breast or formula it's desirable to chart the first two weeks worth of feedings. They reported all questions and concerns were answered, they're both aware of LC OP services and will call PRN.   Maternal Data Formula Feeding for Exclusion: No Has patient been taught Hand Expression?: Yes Does the patient have breastfeeding experience prior to this delivery?: No  Feeding Feeding Type: Breast Fed  LATCH Score Latch: Grasps breast easily, tongue down, lips flanged, rhythmical sucking.(with NS # 20)  Audible Swallowing: None  Type of Nipple: Everted at rest and after stimulation  Comfort (Breast/Nipple): Soft / non-tender  Hold (Positioning): Assistance needed to correctly position infant at breast and maintain latch.  LATCH Score: 7  Interventions Interventions: Breast feeding basics reviewed;Assisted with latch;Skin to skin;Breast massage;Hand express;Breast compression;Adjust position;Support pillows  Lactation Tools Discussed/Used WIC Program: Yes   Consult Status Consult Status: Complete Date: 08/21/19 Follow-up type: Call as needed    Stacey Rodriguez 08/21/2019, 8:40 PM

## 2019-08-21 NOTE — Progress Notes (Signed)
Stacey Rodriguez is a 24 y.o. female G1P0 with IUP at [redacted]w[redacted]d by [place] at 25+5, EDD 08/13/19, (LMP EDD 08/31/19)presenting for IOL for PD.  Subjective: Patient resting comfortably at this time. Did not wake.   Objective: BP (!) 97/50   Pulse (!) 107   Temp 98.4 F (36.9 C) (Oral)   Resp 18   Ht 5\' 6"  (1.676 m)   Wt 90.1 kg   LMP 11/24/2018   SpO2 98%   BMI 32.05 kg/m  I/O last 3 completed shifts: In: -  Out: 1000 [Urine:1000] No intake/output data recorded.  FHT:  FHR: 135 bpm, variability: moderate,  accelerations:  Abscent,  decelerations:  Absent UC:   irregular, every 1-4 minutes SVE:   Dilation: 7.5 Effacement (%): 60 Station: -2 Exam by:: [email or web address], RN  Labs: Lab Results  Component Value Date   WBC 7.6 08/20/2019   HGB 11.1 (L) 08/20/2019   HCT 33.6 (L) 08/20/2019   MCV 86.6 08/20/2019   PLT 204 08/20/2019   Assessment / Plan: Stacey Rodriguez is a 24 y.o. G1P0 at [redacted]w[redacted]d here for IOL for PD. 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   Labor: IOL. S/p FB, cytotec x 3 (2047) Gave terb at 0438 for variable decels. Now stable. No pit at this time.  Fetal Wellbeing:  Category I Pain Control:  per patient request I/D:  GBS + PCN Anticipated MOD:  VD  MOF: breast  MOC: nexplanon prior to discharge  12-06-1982 08/21/2019, 7:19 AM

## 2019-08-21 NOTE — Progress Notes (Signed)
Stacey Rodriguez is a 24 y.o. female G1P0 with IUP at [redacted]w[redacted]d by [place] at 25+5, EDD 08/13/19, (LMP EDD 08/31/19)presenting for IOL for PD.  Subjective: Patient resting comfortably at this time. Did not wake.   Objective: BP (!) 102/54   Pulse 79   Temp 97.8 F (36.6 C) (Oral)   Resp 16   Ht 5\' 6"  (1.676 m)   Wt 90.1 kg   LMP 11/24/2018   SpO2 98%   BMI 32.05 kg/m  No intake/output data recorded. No intake/output data recorded.  FHT:  FHR: 135 bpm, variability: moderate,  accelerations:  Abscent,  decelerations:  Absent UC:   irregular, every 1-4 minutes SVE:   Dilation: 7.5 Effacement (%): 60, 70 Station: -2 Exam by:: [email or web address], RN   Labs: Lab Results  Component Value Date   WBC 7.6 08/20/2019   HGB 11.1 (L) 08/20/2019   HCT 33.6 (L) 08/20/2019   MCV 86.6 08/20/2019   PLT 204 08/20/2019    Assessment / Plan: Stacey Rodriguez is a 24 y.o. G1P0 at [redacted]w[redacted]d here for IOL for PD. 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   Labor: IOL. S/p FB, cytotec x 3 (2047) patient doing well. No pitocin at this time.  Fetal Wellbeing:  Category I Pain Control:  per patient request I/D:  GBS + PCN Anticipated MOD:  VD  MOF: breast  MOC: nexplanon prior to discharge  12-06-1982 08/21/2019, 1:46 AM

## 2019-08-21 NOTE — Anesthesia Procedure Notes (Signed)
Epidural Patient location during procedure: OB Start time: 08/20/2019 11:38 PM End time: 08/20/2019 11:41 PM  Staffing Anesthesiologist: Kaylyn Layer, MD Performed: anesthesiologist   Preanesthetic Checklist Completed: patient identified, IV checked, risks and benefits discussed, monitors and equipment checked, pre-op evaluation and timeout performed  Epidural Patient position: sitting Prep: DuraPrep and site prepped and draped Patient monitoring: continuous pulse ox, blood pressure and heart rate Approach: midline Location: L3-L4 Injection technique: LOR air  Needle:  Needle type: Tuohy  Needle gauge: 17 G Needle length: 9 cm Needle insertion depth: 5 cm Catheter type: closed end flexible Catheter size: 19 Gauge Catheter at skin depth: 10 cm Test dose: negative and Other (1% lidocaine)  Assessment Events: blood not aspirated, injection not painful, no injection resistance, no paresthesia and negative IV test  Additional Notes Patient identified. Risks, benefits, and alternatives discussed with patient including but not limited to bleeding, infection, nerve damage, paralysis, failed block, incomplete pain control, headache, blood pressure changes, nausea, vomiting, reactions to medication, itching, and postpartum back pain. Confirmed with bedside nurse the patient's most recent platelet count. Confirmed with patient that they are not currently taking any anticoagulation, have any bleeding history, or any family history of bleeding disorders. Patient expressed understanding and wished to proceed. All questions were answered. Sterile technique was used throughout the entire procedure. Please see nursing notes for vital signs.   Crisp LOR on first pass. Test dose was given through epidural catheter and negative prior to continuing to dose epidural or start infusion. Warning signs of high block given to the patient including shortness of breath, tingling/numbness in hands, complete  motor block, or any concerning symptoms with instructions to call for help. Patient was given instructions on fall risk and not to get out of bed. All questions and concerns addressed with instructions to call with any issues or inadequate analgesia.  Reason for block:procedure for pain

## 2019-08-21 NOTE — Anesthesia Postprocedure Evaluation (Signed)
Anesthesia Post Note  Patient: [name]  Procedure(s) Performed: CESAREAN SECTION (N/A )     Patient location during evaluation: Mother Baby Anesthesia Type: Epidural Level of consciousness: oriented and awake and alert Pain management: pain level controlled Vital Signs Assessment: post-procedure vital signs reviewed and stable Respiratory status: spontaneous breathing and respiratory function stable Cardiovascular status: blood pressure returned to baseline and stable Postop Assessment: no headache, no backache, no apparent nausea or vomiting and able to ambulate Anesthetic complications: no    Last Vitals:  Vitals:   08/21/19 1406 08/21/19 1434  BP:  115/69  Pulse: 92 90  Resp: 15 16  Temp:  37 C  SpO2: 97% 98%    Last Pain:  Vitals:   08/21/19 1440  TempSrc:   PainSc: 8    Pain Goal:                Epidural/Spinal Function Cutaneous sensation: Able to Discern Pressure (08/21/19 1440), Patient able to flex knees: No (08/21/19 1440), Patient able to lift hips off bed: No (08/21/19 1440), Back pain beyond tenderness at insertion site: No (08/21/19 1440), Progressively worsening motor and/or sensory loss: No (08/21/19 1440), Bowel and/or bladder incontinence post epidural: No (08/21/19 1440)  Stacey Rodriguez

## 2019-08-21 NOTE — Op Note (Signed)
Stacey Rodriguez PROCEDURE DATE: 08/20/2019 - 08/21/2019  PREOPERATIVE DIAGNOSIS: Intrauterine pregnancy at  [redacted]w[redacted]d weeks gestation; non-reassuring fetal status  POSTOPERATIVE DIAGNOSIS: The same  PROCEDURE:     Cesarean Section  SURGEON:  Dr. Catalina Antigua  ASSISTANT: Dr. Jerilynn Birkenhead  INDICATIONS: Stacey Rodriguez is a 24 y.o. G1P1001 at [redacted]w[redacted]d scheduled for cesarean section secondary to non-reassuring fetal status.  The risks of cesarean section discussed with the patient included but were not limited to: bleeding which may require transfusion or reoperation; infection which may require antibiotics; injury to bowel, bladder, ureters or other surrounding organs; injury to the fetus; need for additional procedures including hysterectomy in the event of a life-threatening hemorrhage; placental abnormalities wth subsequent pregnancies, incisional problems, thromboembolic phenomenon and other postoperative/anesthesia complications. The patient concurred with the proposed plan, giving informed written consent for the procedure.    FINDINGS:  Viable female infant in cephalic presentation.  Apgars 9 and 9.  Clear amniotic fluid.  Intact placenta, three vessel cord.  Normal uterus, fallopian tubes and ovaries bilaterally.  ANESTHESIA:    Spinal INTRAVENOUS FLUIDS:2500 ml ESTIMATED BLOOD LOSS: 1164 ml URINE OUTPUT:  350 ml SPECIMENS: Placenta sent to L&D COMPLICATIONS: None immediate  PROCEDURE IN DETAIL:  The patient received intravenous antibiotics and had sequential compression devices applied to her lower extremities while in the preoperative area.  She was then taken to the operating room where anesthesia was induced and was found to be adequate. A foley catheter was placed into her bladder and attached to Stacey Rodriguez gravity. She was then placed in a dorsal supine position with a leftward tilt, and prepped and draped in a sterile manner. After an adequate timeout was performed, a Pfannenstiel skin  incision was made with scalpel and carried through to the underlying layer of fascia. The fascia was incised in the midline and this incision was extended bilaterally using the Mayo scissors. Kocher clamps were applied to the superior aspect of the fascial incision and the underlying rectus muscles were dissected off bluntly. A similar process was carried out on the inferior aspect of the facial incision. The rectus muscles were separated in the midline bluntly and the peritoneum was entered bluntly. The Alexis self-retaining retractor was introduced into the abdominal cavity. Attention was turned to the lower uterine segment where a bladder flap was created, and a transverse hysterotomy was made with a scalpel and extended bilaterally bluntly. The infant was successfully delivered and delayed cord clamping was performed for 1 minutes. The cord was clamped and cut and infant was handed over to awaiting neonatology team. Uterine massage was then administered and the placenta delivered intact with three-vessel cord. The uterus was cleared of clot and debris.  The hysterotomy was closed with 0 Vicryl in a running locked fashion, and an imbricating layer was also placed with a 0 Vicryl. Overall, excellent hemostasis was noted. The pelvis copiously irrigated and cleared of all clot and debris. Hemostasis was confirmed on all surfaces.  The peritoneum and the muscles were reapproximated using 0 vicryl interrupted stitches. The fascia was then closed using 0 Vicryl in a running fashion.  The skin was closed in a subcuticular fashion using 3.0 Vicryl. The patient tolerated the procedure well. Sponge, lap, instrument and needle counts were correct x 2. She was taken to the recovery room in stable condition.    Jaxsyn Catalfamo ConstantMD  08/21/2019 12:42 PM

## 2019-08-21 NOTE — Transfer of Care (Signed)
Immediate Anesthesia Transfer of Care Note  Patient: Stacey Rodriguez  Procedure(s) Performed: CESAREAN SECTION (N/A )  Patient Location: PACU  Anesthesia Type:Epidural  Level of Consciousness: awake, alert  and oriented  Airway & Oxygen Therapy: Patient Spontanous Breathing  Post-op Assessment: Report given to RN and Post -op Vital signs reviewed and stable  Post vital signs: Reviewed and stable  Last Vitals:  Vitals Value Taken Time  BP 114/80 08/21/19 1300  Temp    Pulse 96 08/21/19 1302  Resp 16 08/21/19 1302  SpO2 100 % 08/21/19 1302  Vitals shown include unvalidated device data.  Last Pain:  Vitals:   08/21/19 1031  TempSrc: Oral  PainSc:          Complications: No apparent anesthesia complications

## 2019-08-21 NOTE — Progress Notes (Signed)
Stacey Rodriguez is a 24 y.o. female G1P0 with IUP at 27w0dby [place] 25+5, EDD 08/13/19, (LMP EDD 08/31/19)presenting for IOL for PD.  Subjective: Patient resting comfortably. Met patient and discussed plan with her and FOB.  Objective: BP 101/65   Pulse 95   Temp 98 F (36.7 C) (Oral)   Resp 16   Ht '5\' 6"'  (1.676 m)   Wt 90.1 kg   LMP 11/24/2018   SpO2 98%   BMI 32.05 kg/m  I/O last 3 completed shifts: In: -  Out: 1000 [Urine:1000] No intake/output data recorded.  FHT:  FHR: 135 bpm, variability: moderate,  accelerations:  Abscent,  decelerations:  Recurrent late decels for last 1-2 hours UC:  q2-316mVE:   Dilation: 6 Effacement (%): 70 Station: -2 Exam by:: lee  Labs: Lab Results  Component Value Date   WBC 7.6 08/20/2019   HGB 11.1 (L) 08/20/2019   HCT 33.6 (L) 08/20/2019   MCV 86.6 08/20/2019   PLT 204 08/20/2019   Assessment / Plan: Stacey Rodriguez is a [age].o. G1P0 at 413w0dre for IOL for PD. 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   Labor: IOL. S/p FB, cytotec x 3 (2047) Gave terb at 0438 for late decels. Pit started at 0737 and now turned off due to recurrent lates. Will re-bolus and do position changes as needed and give FHR a chance to recover; already improved since Pit off. Will give 30-40 min and restart Pit if FHR reassuring. If unchanged cervix and unable to tolerate Pitocin, will recommend C-section. Discussed this in detail with patient and FOB who are in agreement with plan. D/w Dr. ConElly ModenaFetal Wellbeing:  Category II Pain Control:  Epidural  I/D:  GBS + PCN Anticipated MOD:  Guarded for VD; CS as appropriate   MOF: breast  MOC: nexplanon prior to discharge  CheChauncey Mann7/2021, 9:23 AM

## 2019-08-22 DIAGNOSIS — O480 Post-term pregnancy: Secondary | ICD-10-CM | POA: Diagnosis not present

## 2019-08-22 DIAGNOSIS — Z30017 Encounter for initial prescription of implantable subdermal contraceptive: Secondary | ICD-10-CM

## 2019-08-22 LAB — CBC
HCT: 25.4 % — ABNORMAL LOW (ref 36.0–46.0)
Hemoglobin: 8.3 g/dL — ABNORMAL LOW (ref 12.0–15.0)
MCH: 28.9 pg (ref 26.0–34.0)
MCHC: 32.7 g/dL (ref 30.0–36.0)
MCV: 88.5 fL (ref 80.0–100.0)
Platelets: 172 10*3/uL (ref 150–400)
RBC: 2.87 MIL/uL — ABNORMAL LOW (ref 3.87–5.11)
RDW: 14 % (ref 11.5–15.5)
WBC: 16.6 10*3/uL — ABNORMAL HIGH (ref 4.0–10.5)
nRBC: 0 % (ref 0.0–0.2)

## 2019-08-22 MED ORDER — FERROUS SULFATE 325 (65 FE) MG PO TABS
325.0000 mg | ORAL_TABLET | Freq: Every day | ORAL | Status: DC
  Administered 2019-08-23: 325 mg via ORAL
  Filled 2019-08-22: qty 1

## 2019-08-22 MED ORDER — ETONOGESTREL 68 MG ~~LOC~~ IMPL
68.0000 mg | DRUG_IMPLANT | Freq: Once | SUBCUTANEOUS | Status: AC
  Administered 2019-08-22: 68 mg via SUBCUTANEOUS
  Filled 2019-08-22: qty 1

## 2019-08-22 MED ORDER — LIDOCAINE HCL 1 % IJ SOLN
0.0000 mL | Freq: Once | INTRAMUSCULAR | Status: AC | PRN
  Administered 2019-08-22: 3 mL via INTRADERMAL
  Filled 2019-08-22: qty 20

## 2019-08-22 NOTE — Procedures (Signed)

## 2019-08-22 NOTE — Clinical Social Work Maternal (Signed)
CLINICAL SOCIAL WORK MATERNAL/CHILD NOTE  Patient Details  Name: Nestor Lewandowsky Reed-Cummings MRN: [id number] Date of Birth: 04/24/1996  Date:  23-Jul-2020  Clinical Social Worker Initiating Note:  Elijio Miles Date/Time: Initiated:  08/22/19/1002     Child's Name:  Antony Contras   Biological Parents:  Mother, Father(Ivanka Reed-Cummings and Halbert Jesson DOB: 12/09/1993)   Need for Interpreter:  None   Reason for Referral:  Behavioral Health Concerns, Current Substance Use/Substance Use During Pregnancy    Address:  [address] [place] [address]    Phone number:  [phone] (home)     Additional phone number:   Household Members/Support Persons (HM/SP):   Household Member/Support Person 1   HM/SP Name Relationship DOB or Age  HM/SP -1 Finas Delone FOB 12/09/1993  HM/SP -2        HM/SP -3        HM/SP -4        HM/SP -5        HM/SP -6        HM/SP -7        HM/SP -8          Natural Supports (not living in the home):  Parent, Extended Family   Professional Supports: None   Employment: Unemployed   Type of Work:     Education:  Programmer, systems   Homebound arranged:    Museum/gallery curator Resources:  Medicaid   Other Resources:  Physicist, medical , [place] Considerations Which May Impact Care:    Strengths:  Ability to meet basic needs , Home prepared for child , Pediatrician chosen   Psychotropic Medications:         Pediatrician:    [place] area  Pediatrician List:   [place]      Pediatrician Fax Number:    Risk Factors/Current Problems:  Mental Health Concerns , Substance Use    Cognitive State:  Able to Concentrate , Alert , Linear Thinking    Mood/Affect:  Calm , Comfortable , Interested , Relaxed    CSW Assessment:  CSW received consult for history of anxiety and depression. During chart review, it was  noted MOB used THC during pregnancy with last use noted to be the day she found out she was pregnant.  CSW met with MOB to offer support and complete assessment.    MOB sitting up in bed with infant laying in bed in front of her and FOB at bedside, when CSW entered the room. FOB on his way to [employer] when [place] arrived. CSW waited until after FOB left the room to start assessment. CSW introduced self and explained reason for consult to which MOB expressed understanding. MOB pleasant and engaged throughout assessment and was noted to be appropriate and attentive to infant during visit. MOB reported she and FOB currently live together in [place]. MOB stated she is not currently employed but receives both [employer] and food stamps and was encouraged to reach out and update them of her delivery. MOB inquired about MOB's mental health history and MOB acknowledged a history of anxiety and depression starting in 2014. MOB denied any recent depression but did note some symptoms during pregnancy that she attributed to stress. MOB denied ever being prescribed medications or receiving counseling in the past and denied any interest in either, at this time. MOB  denied any current symptoms or concerns and reported feeling good. CSW provided education regarding the baby blues period vs. perinatal mood disorders, discussed treatment and gave resources for mental health follow up if concerns arise. CSW recommended self-evaluation during the postpartum time period using the New Mom Checklist from Postpartum Progress and encouraged MOB to contact a medical professional if symptoms are noted at any time. MOB did not appear to be displaying any acute mental health symptoms and denied any current SI, HI or DV. MOB reported having good support from her mom, grandmother, brother, sister and FOB's family. MOB confirmed having all essential items for infant once discharged and stated infant would be sleeping in a bassinet once home.  CSW provided review of Sudden Infant Death Syndrome (SIDS) precautions and safe sleeping habits.   CSW inquired about MOB's substance use history and MOB acknowledged using marijuana during her pregnancy to help with her appetite and stress. MOB reported her last use was 3 days prior to delivery. CSW also addressed MOB's past history of percocet use. MOB acknowledged use and reported last use was at the very beginning of her pregnancy. CSW informed MOB of Hospital Drug Policy and explained UDS and CDS were still pending but that a CPS report would be made, if warranted. CSW also provided information on what process may look like if report was made, at [facility] request.   CSW to monitor UDS and CDS results and make report, if necessary.   CSW Plan/Description:  No Further Intervention Required/No Barriers to Discharge, Sudden Infant Death Syndrome (SIDS) Education, Perinatal Mood and Anxiety Disorder (PMADs) Education, [place], CSW Will Continue to Monitor Umbilical Cord Tissue Drug Screen Results and Make Report if Foye Spurling, LCSW 07/03/20, 10:54 AM

## 2019-08-22 NOTE — Progress Notes (Addendum)
Post Partum Day 1 Subjective: voiding, tolerating PO, + flatus and ambulating around the hospital requiring nurses to recommend more time in bed . Reports significant 8/10 abdominal pain improved with oxycodone 10 mg (0622).  C-section incision bandage soaked through this AM, some blood noted, dressings changed   Objective: Blood pressure (!) 102/55, pulse 73, temperature 97.9 F (36.6 C), temperature source Oral, resp. rate 18, height '5\' 6"'  (1.676 m), weight 90.1 kg, last menstrual period 11/24/2018, SpO2 98 %, unknown if currently breastfeeding.  Physical Exam:  General:  sleepy, dazed , no acute distress Cardio: Normal S1, S2, 2/6 systolic flow murmur  Lochia: appropriate Uterine Fundus: firm Incision: Dressings clean, dry, intact  DVT Evaluation: No evidence of DVT seen on physical exam. No significant calf/ankle edema.  Recent Labs    08/21/19 1519 08/22/19 0533  HGB 9.9* 8.3*  HCT 29.7* 25.4*    Assessment/Plan: Stacey Rodriguez is a 24 yo G1P1001 POD #1 from [facility] and IOL for PD at [redacted]w[redacted]d  Given hgb, start PO iron q2day Plans to breastfeed, continued consultation by lactation  Nexplanon placement needed  Outpatient circumcision planned     LOS: 2 days   LPhill Mutter1/03/2020, 7:15 AM   I personally saw and evaluated the patient, performing the key elements of the service. I developed and verified the management plan that is described in the resident's/student's note, and I agree with the content with my edits above. VSS, HRR&R, Resp unlabored, Legs neg.  FNigel Berthold CNM 08/25/2019 10:20 AM

## 2019-08-23 MED ORDER — ACETAMINOPHEN 325 MG PO TABS: 650.0000 mg | ORAL_TABLET | Freq: Four times a day (QID) | ORAL | 0 refills | Status: DC | PRN

## 2019-08-23 MED ORDER — SENNOSIDES-DOCUSATE SODIUM 8.6-50 MG PO TABS: 2.0000 | ORAL_TABLET | ORAL | 0 refills | Status: DC

## 2019-08-23 MED ORDER — OXYCODONE HCL 5 MG PO TABS: 5.0000 mg | ORAL_TABLET | ORAL | 0 refills | Status: DC | PRN

## 2019-08-23 MED ORDER — IBUPROFEN 800 MG PO TABS: 800.0000 mg | ORAL_TABLET | Freq: Three times a day (TID) | ORAL | 0 refills | Status: DC

## 2019-09-04 ENCOUNTER — Ambulatory Visit (INDEPENDENT_AMBULATORY_CARE_PROVIDER_SITE_OTHER): Payer: Medicaid Other | Admitting: General Practice

## 2019-09-04 ENCOUNTER — Other Ambulatory Visit: Payer: Self-pay

## 2019-09-04 VITALS — BP 118/81 | HR 94 | Ht 67.0 in | Wt 179.0 lb

## 2019-09-04 DIAGNOSIS — Z5189 Encounter for other specified aftercare: Secondary | ICD-10-CM

## 2019-09-04 NOTE — Progress Notes (Signed)
Patient presents to office today for wound check following primary c-section on 1/7. Patient reports doing well since delivery, reports occasional headaches but they are relieved with rest. Incision is clean, dry, & intact and appears to be healing well. Wound care and signs & symptoms of infection reviewed with patient. Patient will follow up at [facility] [address] visit on 2/9.  Chase Caller RN BSN 09/04/19

## 2019-09-12 NOTE — Progress Notes (Signed)
Patient ID: Stacey Rodriguez, female   DOB: 03-May-1996, 24 y.o.   MRN: [id number] Patient seen and assessed by nursing staff during this encounter. I have reviewed the chart and agree with the documentation and plan.  Scheryl Darter, MD 09/12/2019 11:46 AM

## 2019-09-23 ENCOUNTER — Encounter: Payer: Self-pay | Admitting: Family Medicine

## 2019-09-23 ENCOUNTER — Ambulatory Visit: Payer: Medicaid Other | Admitting: Advanced Practice Midwife

## 2019-10-03 ENCOUNTER — Other Ambulatory Visit: Payer: Self-pay

## 2019-10-03 ENCOUNTER — Ambulatory Visit (HOSPITAL_COMMUNITY)
Admission: EM | Admit: 2019-10-03 | Discharge: 2019-10-03 | Disposition: A | Discharge status: Home / Self Care | Payer: Medicaid Other | Attending: Family Medicine | Admitting: Family Medicine

## 2019-10-03 ENCOUNTER — Encounter (HOSPITAL_COMMUNITY): Payer: Self-pay

## 2019-10-03 DIAGNOSIS — L0291 Cutaneous abscess, unspecified: Secondary | ICD-10-CM | POA: Diagnosis not present

## 2019-10-03 MED ORDER — FLUCONAZOLE 200 MG PO TABS: 200.0000 mg | ORAL_TABLET | Freq: Once | ORAL | 0 refills | Status: AC

## 2019-10-03 MED ORDER — DOXYCYCLINE HYCLATE 100 MG PO CAPS: 100.0000 mg | ORAL_CAPSULE | Freq: Two times a day (BID) | ORAL | 0 refills | Status: DC

## 2019-10-03 MED ORDER — KETOROLAC TROMETHAMINE 60 MG/2ML IM SOLN: 60.0000 mg | Freq: Once | INTRAMUSCULAR | Status: DC

## 2019-10-03 MED ORDER — KETOROLAC TROMETHAMINE 60 MG/2ML IM SOLN
INTRAMUSCULAR | Status: AC
  Filled 2019-10-03: qty 2

## 2019-10-03 NOTE — ED Provider Notes (Signed)
[facility]    CSN: [id number] Arrival date & time: 10/03/19  1043      History   Chief Complaint Chief Complaint  Patient presents with  . Abscess    HPI Stacey Rodriguez is a 24 y.o. female.   Patient reports that she has an abscess to her left earlobe.  Reports it has been there for a week.  Reports that she has not tried to drain this on her own.  Reports that this is causing her a lot of pain.  She states she cannot sleep on her left side.  Denies fever, shortness of breath, headache, nausea, vomiting, diarrhea, chest tightness, rash, other symptoms.  ROS Per HPI  The history is provided by the patient.    Past Medical History:  Diagnosis Date  . Genital herpes   . Hx of chlamydia infection 2017  . Hx of trichomoniasis 2017    Patient Active Problem List   Diagnosis Date Noted  . [redacted] weeks gestation of pregnancy 08/21/2019  . Indication for care in labor and delivery, antepartum 08/20/2019  . GBS bacteriuria 07/25/2019  . GBS (group B Streptococcus carrier), +RV culture, currently pregnant 07/22/2019  . Alpha thalassemia silent carrier 05/15/2019  . Sickle cell trait (HCC) 05/15/2019  . Genital herpes affecting pregnancy 05/15/2019  . Supervision of other normal pregnancy, antepartum 01/27/2019  . Substance abuse (HCC) 11/11/2015  . Adjustment disorder with mixed anxiety and depressed mood 11/11/2015  . ECZEMA 05/27/2008    Past Surgical History:  Procedure Laterality Date  . CESAREAN SECTION N/A 08/21/2019   Procedure: CESAREAN SECTION;  Surgeon: Catalina Antigua, MD;  Location: [facility];  Service: Obstetrics;  Laterality: N/A;  . NO PAST SURGERIES      OB History    Gravida  1   Para  1   Term  1   Preterm      AB      Living  1     SAB      TAB      Ectopic      Multiple  0   Live Births  1            Home Medications    Prior to Admission medications   Medication Sig Start Date End Date Taking? Authorizing  Provider  acetaminophen (TYLENOL) 325 MG tablet Take 2 tablets (650 mg total) by mouth every 6 (six) hours as needed for moderate pain. 08/23/19   Fair, Hoyle Sauer, MD  albuterol (PROVENTIL HFA;VENTOLIN HFA) 108 (90 Base) MCG/ACT inhaler Inhale 1-2 puffs into the lungs every 6 (six) hours as needed for wheezing or shortness of breath. 12/13/17   Elvina Sidle, MD  AMBULATORY NON FORMULARY MEDICATION 1 Device by Other route once a week. Medication Name: Blood Pressure Cuff Monitored regularly at home  ICD 10 O09.90 Patient not taking: Reported on 08/05/2019 01/27/19   Marny Lowenstein, PA-C  doxycycline (VIBRAMYCIN) 100 MG capsule Take 1 capsule (100 mg total) by mouth 2 (two) times daily. 10/03/19   Moshe Cipro, NP  fluconazole (DIFLUCAN) 200 MG tablet Take 1 tablet (200 mg total) by mouth once for 1 dose. 10/03/19 10/03/19  Moshe Cipro, NP  ibuprofen (ADVIL) 800 MG tablet Take 1 tablet (800 mg total) by mouth every 8 (eight) hours. 08/23/19   Fair, Hoyle Sauer, MD  oxyCODONE (OXY IR/ROXICODONE) 5 MG immediate release tablet Take 1-2 tablets (5-10 mg total) by mouth every 4 (four) hours as needed for moderate  pain. 08/23/19   Fair, Marin Shutter, MD  Prenatal Vit-Fe Fumarate-FA (PRENATAL VITAMINS PO) Take 1 tablet by mouth daily.    [provider]  senna-docusate (SENOKOT-S) 8.6-50 MG tablet Take 2 tablets by mouth daily. 08/24/19   FairMarin Shutter, MD    Family History Family History  Problem Relation Age of Onset  . Asthma Mother   . Healthy Father     Social History Social History   Tobacco Use  . Smoking status: Never Smoker  . Smokeless tobacco: Never Used  Substance Use Topics  . Alcohol use: Not Currently    Comment: ocassionally  . 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   Review of Systems Review of  Systems   Physical Exam Triage Vital Signs ED Triage Vitals  Enc Vitals Group     BP 10/03/19 1101 119/81     Pulse Rate 10/03/19 1101 91     Resp 10/03/19 1101 16     Temp 10/03/19 1101 98.1 F (36.7 C)     Temp Source 10/03/19 1101 Oral     SpO2 10/03/19 1101 99 %     Weight --      Height --      Head Circumference --      Peak Flow --      Pain Score 10/03/19 1104 10     Pain Loc --      Pain Edu? --      Excl. in [place]? --    No data found.  Updated Vital Signs BP 119/81 (BP Location: Right Arm)   Pulse 91   Temp 98.1 F (36.7 C) (Oral)   Resp 16   SpO2 99%   Breastfeeding No   Visual Acuity Right Eye Distance:   Left Eye Distance:   Bilateral Distance:    Right Eye Near:   Left Eye Near:    Bilateral Near:     Physical Exam Vitals and nursing note reviewed.  Constitutional:      General: She is not in acute distress.    Appearance: She is well-developed.  HENT:     Head: Normocephalic and atraumatic.     Right Ear: Tympanic membrane normal.     Left Ear: Tympanic membrane normal. Swelling and tenderness present.     Ears:      Comments: Area of abscess lesion.    Nose: Nose normal.     Mouth/Throat:     Mouth: Mucous membranes are moist.  Eyes:     Conjunctiva/sclera: Conjunctivae normal.  Cardiovascular:     Rate and Rhythm: Normal rate and regular rhythm.     Heart sounds: Normal heart sounds. No murmur.  Pulmonary:     Effort: Pulmonary effort is normal. No respiratory distress.     Breath sounds: Normal breath sounds.  Abdominal:     General: Bowel sounds are normal. There is no distension.     Palpations: Abdomen is soft. There is no mass.     Tenderness: There is no abdominal tenderness. There is no guarding or rebound.     Hernia: No hernia is present.  Musculoskeletal:        General: Normal range of motion.     Cervical back: Normal range of motion and neck supple.  Skin:  General: Skin is warm and dry.     Capillary Refill:  Capillary refill takes less than 2 seconds.  Neurological:     General: No focal deficit present.     Mental Status: She is alert and oriented to person, place, and time.  Psychiatric:        Mood and Affect: Mood normal.        Behavior: Behavior normal.      UC Treatments / Results  Labs (all labs ordered are listed, but only abnormal results are displayed) Labs Reviewed - No data to display  EKG   Radiology No results found.  Procedures Incision and Drainage  Date/Time: 10/03/2019 12:07 PM Performed by: Moshe Cipro, NP Authorized by: Moshe Cipro, NP   Consent:    Consent obtained:  Verbal   Consent given by:  Patient   Risks discussed:  Bleeding, incomplete drainage, infection and pain   Alternatives discussed:  Alternative treatment Location:    Type:  Abscess   Size:  2 x 2 cm   Location:  Head (L ear lobe)   Head location:  L external ear Pre-procedure details:    Skin preparation:  Chloraprep Anesthesia (see MAR for exact dosages):    Anesthesia method:  Local infiltration   Local anesthetic:  Lidocaine 2% w/o epi Procedure type:    Complexity:  Simple Procedure details:    Incision types:  Stab incision   Incision depth:  Dermal   Scalpel blade:  11   Wound management:  Irrigated with saline   Drainage:  Purulent   Drainage amount:  Moderate   Wound treatment:  Wound left open   Packing materials:  None Post-procedure details:    Patient tolerance of procedure:  Tolerated well, no immediate complications   (including critical care time)  Medications Ordered in UC Medications - No data to display  Initial Impression / Assessment and Plan / UC Course  I have reviewed the triage vital signs and the nursing notes.  Pertinent labs & imaging results that were available during my care of the patient were reviewed by me and considered in my medical decision making (see chart for details).     Abscess to left earlobe.  Left earlobe  very tender, erythematous.  I&D in office today.  Purulent fluid drained from left ear.  Sent in doxycycline 100 mg twice daily x7 days.  Instructed patient to complete course of therapy.  Instructed patient when to come back and follow-up here, if not feeling better by Monday.  Instructed on when to go to the ER. Final Clinical Impressions(s) / UC Diagnoses   Final diagnoses:  Abscess     Discharge Instructions     Keep dry and covered for next 24-48 hours  After packing is removed in 48 hours you may then begin appling warm compresses 3-4x daily for 10-15 minutes.  You may then wash site daily with warm water and mild soap Keep covered to avoid friction  Take antibiotic as prescribed and to completion  Return sooner or go to the ED if you have any new or worsening symptoms such as increased redness, swelling, pain, nausea, vomiting, fever, chills, etc...     ED Prescriptions    Medication Sig Dispense Auth. Provider   doxycycline (VIBRAMYCIN) 100 MG capsule Take 1 capsule (100 mg total) by mouth 2 (two) times daily. 14 capsule Moshe Cipro, NP   fluconazole (DIFLUCAN) 200 MG tablet Take 1 tablet (200 mg total) by mouth once  for 1 dose. 2 tablet Moshe Cipro, NP     I have reviewed the PDMP during this encounter.   Moshe Cipro, NP 10/03/19 2028

## 2019-10-03 NOTE — ED Triage Notes (Signed)
Patient presents to Urgent Care with complaints of abscess/ infected area on her left ear lobe since 5 days ago. Patient reports she thinks a spider bit her.

## 2019-10-03 NOTE — Discharge Instructions (Addendum)
Keep dry and covered for next 24-48 hours  After packing is removed in 48 hours you may then begin appling warm compresses 3-4x daily for 10-15 minutes.  You may then wash site daily with warm water and mild soap Keep covered to avoid friction  Take antibiotic as prescribed and to completion  Return sooner or go to the ED if you have any new or worsening symptoms such as increased redness, swelling, pain, nausea, vomiting, fever, chills, etc..Marland Kitchen

## 2019-10-16 DIAGNOSIS — Z3009 Encounter for other general counseling and advice on contraception: Secondary | ICD-10-CM | POA: Diagnosis not present

## 2019-12-06 ENCOUNTER — Emergency Department (HOSPITAL_COMMUNITY)
Admission: EM | Admit: 2019-12-06 | Discharge: 2019-12-06 | Disposition: A | Discharge status: Home / Self Care | Payer: Medicaid Other | Attending: Emergency Medicine | Admitting: Emergency Medicine

## 2019-12-06 ENCOUNTER — Encounter (HOSPITAL_COMMUNITY): Payer: Self-pay

## 2019-12-06 ENCOUNTER — Other Ambulatory Visit: Payer: Self-pay

## 2019-12-06 DIAGNOSIS — T407X1A Poisoning by cannabis (derivatives), accidental (unintentional), initial encounter: Secondary | ICD-10-CM | POA: Diagnosis not present

## 2019-12-06 DIAGNOSIS — T50901A Poisoning by unspecified drugs, medicaments and biological substances, accidental (unintentional), initial encounter: Secondary | ICD-10-CM | POA: Diagnosis not present

## 2019-12-06 DIAGNOSIS — Z79899 Other long term (current) drug therapy: Secondary | ICD-10-CM | POA: Diagnosis not present

## 2019-12-06 DIAGNOSIS — T5191XA Toxic effect of unspecified alcohol, accidental (unintentional), initial encounter: Secondary | ICD-10-CM | POA: Diagnosis not present

## 2019-12-06 LAB — RAPID URINE DRUG SCREEN, HOSP PERFORMED
Amphetamines: NOT DETECTED
Barbiturates: NOT DETECTED
Benzodiazepines: NOT DETECTED
Cocaine: NOT DETECTED
Opiates: NOT DETECTED
Tetrahydrocannabinol: POSITIVE — AB

## 2019-12-06 LAB — URINALYSIS, ROUTINE W REFLEX MICROSCOPIC
Bilirubin Urine: NEGATIVE
Glucose, UA: NEGATIVE mg/dL
Hgb urine dipstick: NEGATIVE
Ketones, ur: 80 mg/dL — AB
Nitrite: NEGATIVE
Protein, ur: NEGATIVE mg/dL
Specific Gravity, Urine: 1.015 (ref 1.005–1.030)
pH: 7 (ref 5.0–8.0)

## 2019-12-06 LAB — COMPREHENSIVE METABOLIC PANEL
ALT: 21 U/L (ref 0–44)
AST: 23 U/L (ref 15–41)
Albumin: 4.5 g/dL (ref 3.5–5.0)
Alkaline Phosphatase: 54 U/L (ref 38–126)
Anion gap: 8 (ref 5–15)
BUN: 13 mg/dL (ref 6–20)
CO2: 23 mmol/L (ref 22–32)
Calcium: 9.2 mg/dL (ref 8.9–10.3)
Chloride: 108 mmol/L (ref 98–111)
Creatinine, Ser: 0.8 mg/dL (ref 0.44–1.00)
GFR calc Af Amer: >60 mL/min (ref >60)
GFR calc non Af Amer: >60 mL/min (ref >60)
Glucose, Bld: 133 mg/dL — ABNORMAL HIGH (ref 70–99)
Potassium: 3.9 mmol/L (ref 3.5–5.1)
Sodium: 139 mmol/L (ref 135–145)
Total Bilirubin: 0.9 mg/dL (ref 0.3–1.2)
Total Protein: 7.5 g/dL (ref 6.5–8.1)

## 2019-12-06 LAB — CBC WITH DIFFERENTIAL/PLATELET
Abs Immature Granulocytes: 0.03 10*3/uL (ref 0.00–0.07)
Basophils Absolute: 0 10*3/uL (ref 0.0–0.1)
Basophils Relative: 0 %
Eosinophils Absolute: 0 10*3/uL (ref 0.0–0.5)
Eosinophils Relative: 0 %
HCT: 35.1 % — ABNORMAL LOW (ref 36.0–46.0)
Hemoglobin: 11.7 g/dL — ABNORMAL LOW (ref 12.0–15.0)
Immature Granulocytes: 0 %
Lymphocytes Relative: 9 %
Lymphs Abs: 0.8 10*3/uL (ref 0.7–4.0)
MCH: 27.8 pg (ref 26.0–34.0)
MCHC: 33.3 g/dL (ref 30.0–36.0)
MCV: 83.4 fL (ref 80.0–100.0)
Monocytes Absolute: 0.5 10*3/uL (ref 0.1–1.0)
Monocytes Relative: 6 %
Neutro Abs: 7.5 10*3/uL (ref 1.7–7.7)
Neutrophils Relative %: 85 %
Platelets: 196 10*3/uL (ref 150–400)
RBC: 4.21 MIL/uL (ref 3.87–5.11)
RDW: 17.5 % — ABNORMAL HIGH (ref 11.5–15.5)
WBC: 8.8 10*3/uL (ref 4.0–10.5)
nRBC: 0 % (ref 0.0–0.2)

## 2019-12-06 LAB — ETHANOL: Alcohol, Ethyl (B): <10 mg/dL (ref <10)

## 2019-12-06 LAB — SALICYLATE LEVEL: Salicylate Lvl: <7 mg/dL — ABNORMAL LOW (ref 7.0–30.0)

## 2019-12-06 LAB — ACETAMINOPHEN LEVEL: Acetaminophen (Tylenol), Serum: <10 ug/mL — ABNORMAL LOW (ref 10–30)

## 2019-12-06 LAB — LIPASE, BLOOD: Lipase: 22 U/L (ref 11–51)

## 2019-12-06 MED ORDER — SODIUM CHLORIDE 0.9 % IV BOLUS
1000.0000 mL | Freq: Once | INTRAVENOUS | Status: AC
  Administered 2019-12-06: 1000 mL via INTRAVENOUS

## 2019-12-06 MED ORDER — ONDANSETRON 4 MG PO TBDP: 4.0000 mg | ORAL_TABLET | Freq: Once | ORAL | Status: DC

## 2019-12-06 MED ORDER — LORAZEPAM 2 MG/ML IJ SOLN
0.5000 mg | Freq: Once | INTRAMUSCULAR | Status: AC
  Administered 2019-12-06: 20:00:00 0.5 mg via INTRAVENOUS
  Filled 2019-12-06: qty 1

## 2019-12-06 NOTE — ED Notes (Signed)
Patient's significant other at bedside to take patient home

## 2019-12-06 NOTE — ED Provider Notes (Signed)
[facility]EMERGENCY [facility]PT Provider Note   CSN: [id number] Arrival date & time: 12/06/19  1514     History Chief Complaint  Patient presents with  . Drug Overdose    Stacey Rodriguez is a 24 y.o. female.  The history is provided by the patient, the EMS personnel and medical records. No language interpreter was used.  Drug Overdose     24 year old female with history of polysubstance abuse, adjustment disorder, sickle cell trait, brought here via EMS for evaluation of polysubstance abuse.  Patient report she went to a party today, as admits to consuming a large amount of alcohol, large amount of weeds, a large amount of ecstasy use, 2 Percocets.  Afterward she endorsed nausea vomiting and abdominal pain.  Described pain as a cramping sensation throughout 10 out of 10 and she did call EMS.  She denies SI or HI.  She was given Haldol and Zofran via EMS prior to arrival.  She does have known history of substance abuse.  She is unsure of her last pregnancy status.  Past Medical History:  Diagnosis Date  . Genital herpes   . Hx of chlamydia infection 2017  . Hx of trichomoniasis 2017    Patient Active Problem List   Diagnosis Date Noted  . [redacted] weeks gestation of pregnancy 08/21/2019  . Indication for care in labor and delivery, antepartum 08/20/2019  . GBS bacteriuria 07/25/2019  . GBS (group B Streptococcus carrier), +RV culture, currently pregnant 07/22/2019  . Alpha thalassemia silent carrier 05/15/2019  . Sickle cell trait (HCC) 05/15/2019  . Genital herpes affecting pregnancy 05/15/2019  . Supervision of other normal pregnancy, antepartum 01/27/2019  . Substance abuse (HCC) 11/11/2015  . Adjustment disorder with mixed anxiety and depressed mood 11/11/2015  . ECZEMA 05/27/2008    Past Surgical History:  Procedure Laterality Date  . CESAREAN SECTION N/A 08/21/2019   Procedure: CESAREAN SECTION;  Surgeon: Catalina Antigua, MD;  Location: [facility];   Service: Obstetrics;  Laterality: N/A;  . NO PAST SURGERIES       OB History    Gravida  1   Para  1   Term  1   Preterm      AB      Living  1     SAB      TAB      Ectopic      Multiple  0   Live Births  1           Family History  Problem Relation Age of Onset  . Asthma Mother   . Healthy Father     Social History   Tobacco Use  . Smoking status: Never Smoker  . Smokeless tobacco: Never Used  Substance Use Topics  . Alcohol use: Not Currently    Comment: ocassionally  . 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 Authorizing Provider  acetaminophen (TYLENOL) 325 MG tablet Take 2 tablets (650 mg total) by mouth every 6 (six) hours as needed for moderate pain. 08/23/19   Fair, Hoyle Sauer, MD  albuterol (PROVENTIL HFA;VENTOLIN HFA) 108 (90 Base) MCG/ACT inhaler Inhale 1-2 puffs into the lungs every 6 (six) hours as needed for wheezing or shortness of breath. 12/13/17   Elvina Sidle, MD  AMBULATORY NON FORMULARY MEDICATION 1 Device by Other route once a week. Medication Name: Blood Pressure Cuff Monitored regularly at home  ICD 10 O09.90 Patient not taking: Reported on 08/05/2019 01/27/19   Marny Lowenstein, PA-C  doxycycline (VIBRAMYCIN) 100 MG capsule Take 1 capsule (100 mg total) by mouth 2 (two) times daily. 10/03/19   Moshe Cipro, NP  ibuprofen (ADVIL) 800 MG tablet Take 1 tablet (800 mg total) by mouth every 8 (eight) hours. 08/23/19   Fair, Hoyle Sauer, MD  oxyCODONE (OXY IR/ROXICODONE) 5 MG immediate release tablet Take 1-2 tablets (5-10 mg total) by mouth every 4 (four) hours as needed for moderate pain. 08/23/19   Fair, Hoyle Sauer, MD  Prenatal Vit-Fe Fumarate-FA (PRENATAL VITAMINS PO) Take 1 tablet by mouth daily.    [provider]    senna-docusate (SENOKOT-S) 8.6-50 MG tablet Take 2 tablets by mouth daily. 08/24/19   FairHoyle Sauer, MD    Allergies    Patient has no known allergies.  Review of Systems   Review of Systems  All other systems reviewed and are negative.   Physical Exam Updated Vital Signs There were no vitals taken for this visit.  Physical Exam Vitals and nursing note reviewed.  Constitutional:      General: She is not in acute distress.    Appearance: She is well-developed.     Comments: Patient is laying with her blankets wrapped around her, not really cooperative but no acute discomfort.  HENT:     Head: Atraumatic.  Eyes:     Conjunctiva/sclera: Conjunctivae normal.  Cardiovascular:     Rate and Rhythm: Bradycardia present.  Pulmonary:     Effort: Pulmonary effort is normal.     Breath sounds: No wheezing, rhonchi or rales.  Abdominal:     Palpations: Abdomen is soft.     Tenderness: There is no abdominal tenderness.  Musculoskeletal:     Cervical back: Neck supple.  Skin:    Findings: No rash.  Neurological:     Mental Status: She is alert and oriented to person, place, and time.     GCS: GCS eye subscore is 4. GCS verbal subscore is 5. GCS motor subscore is 6.  Psychiatric:        Behavior: Behavior is agitated.        Thought Content: Thought content does not include homicidal or suicidal ideation.     ED Results / Procedures / Treatments   Labs (all labs ordered are listed, but only abnormal results are displayed) Labs Reviewed  ACETAMINOPHEN LEVEL - Abnormal; Notable for the following components:      Result Value   Acetaminophen (Tylenol), Serum <10 (*)    All other components within normal limits  COMPREHENSIVE METABOLIC PANEL - Abnormal; Notable for the following components:   Glucose, Bld 133 (*)    All other components within normal limits  SALICYLATE LEVEL - Abnormal; Notable for the following components:   Salicylate Lvl <7.0 (*)    All other components  within normal limits  CBC WITH DIFFERENTIAL/PLATELET - Abnormal; Notable for the following components:   Hemoglobin 11.7 (*)    HCT 35.1 (*)    RDW 17.5 (*)    All other components within normal limits  ETHANOL  LIPASE, BLOOD  RAPID URINE DRUG SCREEN, HOSP PERFORMED  URINALYSIS, ROUTINE W REFLEX MICROSCOPIC    EKG EKG Interpretation  Date/Time:  Saturday December 06 2019 15:29:31 EDT Ventricular Rate:  51 PR Interval:  QRS Duration: 110 QT Interval:  507 QTC Calculation: 467 R Axis:   55 Text Interpretation: Sinus rhythm Multiple premature complexes, vent & supraven flipped t waves in v2 seen on prior No significant change since last tracing Confirmed by Deno Etienne [phone]) on 12/06/2019 3:46:28 PM   Radiology No results found.  Procedures Procedures (including critical care time)  Medications Ordered in ED Medications  sodium chloride 0.9 % bolus 1,000 mL (has no administration in time range)    ED Course  I have reviewed the triage vital signs and the nursing notes.  Pertinent labs & imaging results that were available during my care of the patient were reviewed by me and considered in my medical decision making (see chart for details).    MDM Rules/Calculators/A&P                      BP (!) 113/92   Pulse 66   Temp (!) 97.5 F (36.4 C) (Oral)   Resp 18   SpO2 99%   Final Clinical Impression(s) / ED Diagnoses Final diagnoses:  Drug overdose, accidental or unintentional, initial encounter    Rx / DC Orders ED Discharge Orders    None     3:35 PM Patient here complaining of abdominal pain, nausea vomiting, after consuming a large amount of alcohol, ecstasy, marijuana, and Percocet.  Denies any SI HI.  She was at a party.  She is currently in no acute discomfort.  Generalized abdominal tenderness on palpation without guarding or rebound tenderness.  EKG obtained showing mildly prolonged QT, will avoid QT prolongation agent.  Poison control contacted.  IV  fluid given.  [facility] was contacted and recommend: Repeat EKG in 4 hrs.  If need any antipsychotic and consider benzo.    5:56 PM Patient signed out to Dr. Tyrone Nine, who will obtain repeat EKG at 4 hours mark, and will discharge patient when she is clinically sober and back to baseline.   Domenic Moras, PA-C 12/06/19 [place], [place], DO 12/06/19 1954

## 2019-12-06 NOTE — Discharge Instructions (Signed)
Follow up with your doctor.  Dont do drugs

## 2019-12-06 NOTE — ED Triage Notes (Signed)
Per EMS, Pt consumed weed, "a lot" of estasy, 2 percocet, unknown amount of weed, and a bottle of alcohol. Pt was parting for her birthday Pt given 5mg  of halodol, and 4 mg of Zofran by ems.

## 2020-01-26 ENCOUNTER — Other Ambulatory Visit: Payer: Self-pay

## 2020-01-26 ENCOUNTER — Emergency Department (HOSPITAL_COMMUNITY)
Admission: EM | Admit: 2020-01-26 | Discharge: 2020-01-26 | Discharge status: Against Medical Advice | Payer: Medicaid Other | Attending: Emergency Medicine | Admitting: Emergency Medicine

## 2020-01-26 DIAGNOSIS — R1110 Vomiting, unspecified: Secondary | ICD-10-CM | POA: Diagnosis not present

## 2020-01-26 DIAGNOSIS — R109 Unspecified abdominal pain: Secondary | ICD-10-CM | POA: Diagnosis present

## 2020-01-26 NOTE — ED Triage Notes (Signed)
Writer went into triage #3 to triage the patient and she was not there. A [employer] officer states he saw her walk down the hall and pointed toward the physician's room . Patient left a robe lying in the floor with emesis on it .

## 2020-01-26 NOTE — ED Notes (Signed)
Pt not seen in lobby No answer x2

## 2020-01-26 NOTE — ED Triage Notes (Signed)
Per EMS- patient reported that she took Percocet x 2 tabs 2 days ago to get high. Patient is now c.o abdominal pain. EMS stated that the patient was sticking her fingers down her throat to make herself vomit. EMS stated they asked her not to do that and she kept on. Patient also was trying to get EMS staff to start an IV and stated that she always feels better when she gets an IV started.

## 2020-01-27 ENCOUNTER — Emergency Department (HOSPITAL_COMMUNITY)
Admission: EM | Admit: 2020-01-27 | Discharge: 2020-01-27 | Discharge status: Against Medical Advice | Payer: Medicaid Other | Attending: Emergency Medicine | Admitting: Emergency Medicine

## 2020-01-27 ENCOUNTER — Other Ambulatory Visit: Payer: Self-pay

## 2020-01-27 ENCOUNTER — Encounter (HOSPITAL_COMMUNITY): Payer: Self-pay | Admitting: *Deleted

## 2020-01-27 DIAGNOSIS — F1910 Other psychoactive substance abuse, uncomplicated: Secondary | ICD-10-CM | POA: Insufficient documentation

## 2020-01-27 DIAGNOSIS — R109 Unspecified abdominal pain: Secondary | ICD-10-CM | POA: Diagnosis not present

## 2020-01-27 DIAGNOSIS — R1110 Vomiting, unspecified: Secondary | ICD-10-CM | POA: Diagnosis not present

## 2020-01-27 DIAGNOSIS — Z5321 Procedure and treatment not carried out due to patient leaving prior to being seen by health care provider: Secondary | ICD-10-CM | POA: Insufficient documentation

## 2020-01-27 LAB — COMPREHENSIVE METABOLIC PANEL
ALT: 19 U/L (ref 0–44)
AST: 26 U/L (ref 15–41)
Albumin: 5.2 g/dL — ABNORMAL HIGH (ref 3.5–5.0)
Alkaline Phosphatase: 65 U/L (ref 38–126)
Anion gap: 13 (ref 5–15)
BUN: 16 mg/dL (ref 6–20)
CO2: 21 mmol/L — ABNORMAL LOW (ref 22–32)
Calcium: 10.1 mg/dL (ref 8.9–10.3)
Chloride: 106 mmol/L (ref 98–111)
Creatinine, Ser: 0.85 mg/dL (ref 0.44–1.00)
GFR calc Af Amer: >60 mL/min (ref >60)
GFR calc non Af Amer: >60 mL/min (ref >60)
Glucose, Bld: 163 mg/dL — ABNORMAL HIGH (ref 70–99)
Potassium: 3.9 mmol/L (ref 3.5–5.1)
Sodium: 140 mmol/L (ref 135–145)
Total Bilirubin: 0.7 mg/dL (ref 0.3–1.2)
Total Protein: 9.1 g/dL — ABNORMAL HIGH (ref 6.5–8.1)

## 2020-01-27 LAB — CBC
HCT: 40.4 % (ref 36.0–46.0)
Hemoglobin: 13.7 g/dL (ref 12.0–15.0)
MCH: 29 pg (ref 26.0–34.0)
MCHC: 33.9 g/dL (ref 30.0–36.0)
MCV: 85.4 fL (ref 80.0–100.0)
Platelets: 255 10*3/uL (ref 150–400)
RBC: 4.73 MIL/uL (ref 3.87–5.11)
RDW: 15.3 % (ref 11.5–15.5)
WBC: 9.6 10*3/uL (ref 4.0–10.5)
nRBC: 0 % (ref 0.0–0.2)

## 2020-01-27 LAB — LIPASE, BLOOD: Lipase: 27 U/L (ref 11–51)

## 2020-01-27 LAB — I-STAT BETA HCG BLOOD, ED (MC, WL, AP ONLY): I-stat hCG, quantitative: <5 m[IU]/mL (ref <5)

## 2020-01-27 MED ORDER — ONDANSETRON 4 MG PO TBDP
4.0000 mg | ORAL_TABLET | Freq: Once | ORAL | Status: AC | PRN
  Administered 2020-01-27: 4 mg via ORAL
  Filled 2020-01-27: qty 1

## 2020-01-27 NOTE — ED Notes (Signed)
Patient screaming, laying on floor-states she is "going to die"-reassured patient that we would be starting her treatment even though she is not in an acute room

## 2020-01-27 NOTE — ED Triage Notes (Addendum)
Pt complains of abdominal pain and vomiting since yesterday. She states it is because she is withdrawing from percocet.

## 2020-01-29 ENCOUNTER — Emergency Department (HOSPITAL_COMMUNITY)
Admission: EM | Admit: 2020-01-29 | Discharge: 2020-01-29 | Disposition: A | Discharge status: Home / Self Care | Payer: Medicaid Other | Attending: Emergency Medicine | Admitting: Emergency Medicine

## 2020-01-29 ENCOUNTER — Encounter (HOSPITAL_COMMUNITY): Payer: Self-pay

## 2020-01-29 ENCOUNTER — Other Ambulatory Visit: Payer: Self-pay

## 2020-01-29 ENCOUNTER — Emergency Department (HOSPITAL_COMMUNITY)
Admission: EM | Admit: 2020-01-29 | Discharge: 2020-01-29 | Discharge status: Against Medical Advice | Payer: Medicaid Other

## 2020-01-29 DIAGNOSIS — F1193 Opioid use, unspecified with withdrawal: Secondary | ICD-10-CM

## 2020-01-29 DIAGNOSIS — R109 Unspecified abdominal pain: Secondary | ICD-10-CM | POA: Diagnosis not present

## 2020-01-29 DIAGNOSIS — F1123 Opioid dependence with withdrawal: Secondary | ICD-10-CM | POA: Insufficient documentation

## 2020-01-29 DIAGNOSIS — R52 Pain, unspecified: Secondary | ICD-10-CM | POA: Diagnosis not present

## 2020-01-29 DIAGNOSIS — R112 Nausea with vomiting, unspecified: Secondary | ICD-10-CM | POA: Diagnosis not present

## 2020-01-29 DIAGNOSIS — R9431 Abnormal electrocardiogram [ECG] [EKG]: Secondary | ICD-10-CM | POA: Diagnosis not present

## 2020-01-29 DIAGNOSIS — R1084 Generalized abdominal pain: Secondary | ICD-10-CM | POA: Diagnosis not present

## 2020-01-29 LAB — CBC WITH DIFFERENTIAL/PLATELET
Abs Immature Granulocytes: 0.04 10*3/uL (ref 0.00–0.07)
Basophils Absolute: 0 10*3/uL (ref 0.0–0.1)
Basophils Relative: 0 %
Eosinophils Absolute: 0.2 10*3/uL (ref 0.0–0.5)
Eosinophils Relative: 3 %
HCT: 43.5 % (ref 36.0–46.0)
Hemoglobin: 14.6 g/dL (ref 12.0–15.0)
Immature Granulocytes: 1 %
Lymphocytes Relative: 26 %
Lymphs Abs: 1.9 10*3/uL (ref 0.7–4.0)
MCH: 28.6 pg (ref 26.0–34.0)
MCHC: 33.6 g/dL (ref 30.0–36.0)
MCV: 85.1 fL (ref 80.0–100.0)
Monocytes Absolute: 1 10*3/uL (ref 0.1–1.0)
Monocytes Relative: 14 %
Neutro Abs: 4 10*3/uL (ref 1.7–7.7)
Neutrophils Relative %: 56 %
Platelets: 270 10*3/uL (ref 150–400)
RBC: 5.11 MIL/uL (ref 3.87–5.11)
RDW: 14.8 % (ref 11.5–15.5)
WBC: 7.1 10*3/uL (ref 4.0–10.5)
nRBC: 0 % (ref 0.0–0.2)

## 2020-01-29 LAB — COMPREHENSIVE METABOLIC PANEL
ALT: 15 U/L (ref 0–44)
AST: 16 U/L (ref 15–41)
Albumin: 5.1 g/dL — ABNORMAL HIGH (ref 3.5–5.0)
Alkaline Phosphatase: 60 U/L (ref 38–126)
Anion gap: 13 (ref 5–15)
BUN: 18 mg/dL (ref 6–20)
CO2: 22 mmol/L (ref 22–32)
Calcium: 9.7 mg/dL (ref 8.9–10.3)
Chloride: 102 mmol/L (ref 98–111)
Creatinine, Ser: 0.93 mg/dL (ref 0.44–1.00)
GFR calc Af Amer: >60 mL/min (ref >60)
GFR calc non Af Amer: >60 mL/min (ref >60)
Glucose, Bld: 111 mg/dL — ABNORMAL HIGH (ref 70–99)
Potassium: 3.5 mmol/L (ref 3.5–5.1)
Sodium: 137 mmol/L (ref 135–145)
Total Bilirubin: 1.3 mg/dL — ABNORMAL HIGH (ref 0.3–1.2)
Total Protein: 8.7 g/dL — ABNORMAL HIGH (ref 6.5–8.1)

## 2020-01-29 LAB — URINALYSIS, ROUTINE W REFLEX MICROSCOPIC
Bilirubin Urine: NEGATIVE
Glucose, UA: NEGATIVE mg/dL
Ketones, ur: 5 mg/dL — AB
Leukocytes,Ua: NEGATIVE
Nitrite: NEGATIVE
Protein, ur: 100 mg/dL — AB
Specific Gravity, Urine: 1.025 (ref 1.005–1.030)
pH: 6 (ref 5.0–8.0)

## 2020-01-29 LAB — I-STAT BETA HCG BLOOD, ED (MC, WL, AP ONLY): I-stat hCG, quantitative: <5 m[IU]/mL (ref <5)

## 2020-01-29 LAB — LIPASE, BLOOD: Lipase: 39 U/L (ref 11–51)

## 2020-01-29 MED ORDER — SODIUM CHLORIDE 0.9 % IV SOLN
8.0000 mg | Freq: Once | INTRAVENOUS | Status: AC
  Administered 2020-01-29: 8 mg via INTRAVENOUS
  Filled 2020-01-29: qty 4

## 2020-01-29 MED ORDER — ONDANSETRON HCL 4 MG PO TABS
4.0000 mg | ORAL_TABLET | Freq: Three times a day (TID) | ORAL | 0 refills | Status: DC | PRN
Start: 2020-01-29 — End: 2020-05-19

## 2020-01-29 MED ORDER — SODIUM CHLORIDE 0.9 % IV BOLUS
1000.0000 mL | Freq: Once | INTRAVENOUS | Status: AC
  Administered 2020-01-29: 1000 mL via INTRAVENOUS

## 2020-01-29 MED ORDER — DICYCLOMINE HCL 10 MG/ML IM SOLN
20.0000 mg | Freq: Once | INTRAMUSCULAR | Status: AC
  Administered 2020-01-29: 20 mg via INTRAMUSCULAR
  Filled 2020-01-29: qty 2

## 2020-01-29 NOTE — ED Triage Notes (Signed)
Arrived by EMS from home. Patient c/o abdominal pain and vomiting all day.  136/70 90 98.7 cbg 133

## 2020-01-29 NOTE — Discharge Instructions (Signed)
You were given a prescription for zofran to help with your nausea. Please take as directed.  Please follow up with your primary doctor within the next 5-7 days.  If you do not have a primary care provider, information for a healthcare clinic has been provided for you to make arrangements for follow up care. Please return to the ER sooner if you have any new or worsening symptoms, or if you have any of the following symptoms:  Abdominal pain that does not go away.  You have a fever.  You keep throwing up (vomiting).  The pain is felt only in portions of the abdomen. Pain in the right side could possibly be appendicitis. In an adult, pain in the left lower portion of the abdomen could be colitis or diverticulitis.  You pass bloody or black tarry stools.  There is bright red blood in the stool.  The constipation stays for more than 4 days.  There is belly (abdominal) or rectal pain.  You do not seem to be getting better.  You have any questions or concerns.   

## 2020-01-29 NOTE — ED Provider Notes (Signed)
[facility]-EMERGENCY DEPT Provider Note   CSN: [id number] Arrival date & time: 01/29/20  0139     History Chief Complaint  Patient presents with  . Abdominal Pain    Stacey Rodriguez is a 24 y.o. female.  HPI   Pt is a 24 y/o female with a h/o genital herpes, who presents to the ED today for eval of abd pain that started a few days ago. Pain located to the periumbilical area and has been intermittent since onset. Pain rated 10/10. Denies exacerbating or alleviating factors. Reports associated nausea, vomiting. Denies constipation, diarrhea, dysuria, frequency, urgency, fevers.   States she uses percocet and marijuana. Last dose of percocet was 1 week ago. She had been using daily prior to this. Denies ETOH use.   Past Medical History:  Diagnosis Date  . Genital herpes   . Hx of chlamydia infection 2017  . Hx of trichomoniasis 2017    Patient Active Problem List   Diagnosis Date Noted  . [redacted] weeks gestation of pregnancy 08/21/2019  . Indication for care in labor and delivery, antepartum 08/20/2019  . GBS bacteriuria 07/25/2019  . GBS (group B Streptococcus carrier), +RV culture, currently pregnant 07/22/2019  . Alpha thalassemia silent carrier 05/15/2019  . Sickle cell trait (HCC) 05/15/2019  . Genital herpes affecting pregnancy 05/15/2019  . Supervision of other normal pregnancy, antepartum 01/27/2019  . Substance abuse (HCC) 11/11/2015  . Adjustment disorder with mixed anxiety and depressed mood 11/11/2015  . ECZEMA 05/27/2008    Past Surgical History:  Procedure Laterality Date  . CESAREAN SECTION N/A 08/21/2019   Procedure: CESAREAN SECTION;  Surgeon: Catalina Antigua, MD;  Location: [facility] LD [facility]S;  Service: Obstetrics;  Laterality: N/A;  . NO PAST SURGERIES       OB History    Gravida  1   Para  1   Term  1   Preterm      AB      Living  1     SAB      TAB      Ectopic      Multiple  0   Live Births  1            Family History  Problem Relation Age of Onset  . Asthma Mother   . Healthy Father     Social History   Tobacco Use  . Smoking status: Never Smoker  . Smokeless tobacco: Never Used  Vaping Use  . Vaping Use: Never used  Substance Use Topics  . Alcohol use: Not Currently    Comment: ocassionally  . 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 Authorizing Provider  acetaminophen (TYLENOL) 325 MG tablet Take 2 tablets (650 mg total) by mouth every 6 (six) hours as needed for moderate pain. Patient not taking: Reported on 12/06/2019 08/23/19   Joselyn Arrow, MD  albuterol (PROVENTIL HFA;VENTOLIN HFA) 108 (90 Base) MCG/ACT inhaler Inhale 1-2 puffs into the lungs every 6 (six) hours as needed for wheezing or shortness of breath. Patient not taking: Reported on 01/29/2020 12/13/17   Elvina Sidle, MD  AMBULATORY NON FORMULARY MEDICATION 1 Device by Other route once a week. Medication Name: Blood Pressure Cuff Monitored regularly at home  ICD 10 O09.90 Patient not  taking: Reported on 08/05/2019 01/27/19   Luvenia Redden, PA-C  doxycycline (VIBRAMYCIN) 100 MG capsule Take 1 capsule (100 mg total) by mouth 2 (two) times daily. Patient not taking: Reported on 12/06/2019 10/03/19   Faustino Congress, NP  ibuprofen (ADVIL) 800 MG tablet Take 1 tablet (800 mg total) by mouth every 8 (eight) hours. Patient not taking: Reported on 12/06/2019 08/23/19   Chauncey Mann, MD  ondansetron (ZOFRAN) 4 MG tablet Take 1 tablet (4 mg total) by mouth every 8 (eight) hours as needed for nausea or vomiting. 01/29/20   Suhaila Troiano S, PA-C  oxyCODONE (OXY IR/ROXICODONE) 5 MG immediate release tablet Take 1-2 tablets (5-10 mg total) by mouth every 4 (four) hours as needed for moderate pain. Patient not taking:  Reported on 12/06/2019 08/23/19   Chauncey Mann, MD  senna-docusate (SENOKOT-S) 8.6-50 MG tablet Take 2 tablets by mouth daily. Patient not taking: Reported on 12/06/2019 08/24/19   Chauncey Mann, MD    Allergies    Patient has no known allergies.  Review of Systems   Review of Systems  Constitutional: Negative for fever.  HENT: Negative for ear pain and sore throat.   Eyes: Negative for visual disturbance.  Respiratory: Negative for cough and shortness of breath.   Cardiovascular: Negative for chest pain.  Gastrointestinal: Positive for abdominal pain, nausea and vomiting. Negative for constipation and diarrhea.  Genitourinary: Negative for dysuria, frequency, hematuria and urgency.  Musculoskeletal: Negative for back pain.  Skin: Negative for rash.  Neurological: Negative for headaches.  All other systems reviewed and are negative.   Physical Exam Updated Vital Signs BP (!) 141/86   Pulse 77   Temp 98.6 F (37 C) (Oral)   Resp 18   LMP 01/27/2020   SpO2 97%   Physical Exam Vitals and nursing note reviewed.  Constitutional:      General: She is not in acute distress.    Appearance: She is well-developed.     Comments: Patient lying in bed with blanket wrapped around head  HENT:     Head: Normocephalic and atraumatic.     Mouth/Throat:     Mouth: Mucous membranes are dry.  Eyes:     Conjunctiva/sclera: Conjunctivae normal.  Cardiovascular:     Rate and Rhythm: Normal rate and regular rhythm.     Heart sounds: No murmur heard.   Pulmonary:     Effort: Pulmonary effort is normal. No respiratory distress.     Breath sounds: Normal breath sounds. No wheezing, rhonchi or rales.  Abdominal:     General: Bowel sounds are normal.     Palpations: Abdomen is soft.     Tenderness: There is no guarding or rebound.     Comments: No focal tenderness  Musculoskeletal:     Cervical back: Neck supple.  Skin:    General: Skin is warm and dry.  Neurological:     Mental  Status: She is alert.     ED Results / Procedures / Treatments   Labs (all labs ordered are listed, but only abnormal results are displayed) Labs Reviewed  COMPREHENSIVE METABOLIC PANEL - Abnormal; Notable for the following components:      Result Value   Glucose, Bld 111 (*)    Total Protein 8.7 (*)    Albumin 5.1 (*)    Total Bilirubin 1.3 (*)    All other components within normal limits  URINALYSIS, ROUTINE W REFLEX MICROSCOPIC - Abnormal; Notable for the following components:  Color, Urine AMBER (*)    Hgb urine dipstick LARGE (*)    Ketones, ur 5 (*)    Protein, ur 100 (*)    Bacteria, UA RARE (*)    All other components within normal limits  CBC WITH DIFFERENTIAL/PLATELET  LIPASE, BLOOD  I-STAT BETA HCG BLOOD, ED ([facility], [facility], [facility] ONLY)    EKG EKG Interpretation  Date/Time:  Thursday January 29 2020 07:51:53 EDT Ventricular Rate:  86 PR Interval:    QRS Duration: 76 QT Interval:  373 QTC Calculation: 447 R Axis:   48 Text Interpretation: Sinus rhythm Probable left atrial enlargement Probable left ventricular hypertrophy Since last tracing rate faster Confirmed by Linwood Dibbles [phone]) on 01/29/2020 8:13:25 AM   Radiology No results found.  Procedures Procedures (including critical care time)  Medications Ordered in ED Medications  ondansetron (ZOFRAN) 8 mg in sodium chloride 0.9 % 50 mL IVPB (8 mg Intravenous New Bag/Given 01/29/20 0812)  sodium chloride 0.9 % bolus 1,000 mL (1,000 mLs Intravenous New Bag/Given 01/29/20 0812)  dicyclomine (BENTYL) injection 20 mg (20 mg Intramuscular Given 01/29/20 0809)    ED Course  I have reviewed the triage vital signs and the nursing notes.  Pertinent labs & imaging results that were available during my care of the patient were reviewed by me and considered in my medical decision making (see chart for details).    MDM Rules/Calculators/A&P                          24 year old female presenting for evaluation of nausea and  vomiting that began a few days ago.  Also associated with abdominal pain.  Patient reports daily use of Percocet and last used about 1 week ago. Also uses marijuana.  VS reassuring. Will get labs, give IVF, antiemetics and bentyl.   CBC nonacute CMP nonacute Lipase negative Beta hcg negative UA without leukocytosis or anemia. Appears contaminated.   EKG Sinus rhythm Probable left atrial enlargement Probable left ventricular hypertrophy Since last tracing rate faster   Rechecked pt. She states her nausea has improved and she is no longer vomiting. She was able to tolerate PO. States abd pain has improved as well. W/u reassuring. Low suspicion for appendicitis, bowel obstruction, bowel perforation, cholecystitis, diverticulitis, or ectopic pregnancy. Suspect sxs related to opioid withdrawal. Patient discharged home with symptomatic treatment and given strict instructions for follow-up with their primary care physician.  I have also discussed reasons to return immediately to the ER.  Patient expresses understanding and agrees with plan.  Final Clinical Impression(s) / ED Diagnoses Final diagnoses:  Abdominal pain, unspecified abdominal location  Non-intractable vomiting with nausea, unspecified vomiting type  Opioid withdrawal (HCC)    Rx / DC Orders ED Discharge Orders         Ordered    ondansetron (ZOFRAN) 4 MG tablet  Every 8 hours PRN     Discontinue  Reprint     01/29/20 0909           Lancer Thurner S, PA-C 01/29/20 0919    Linwood Dibbles, MD 01/30/20 0700

## 2020-04-05 LAB — RPR: RPR Ser Ql: NONREACTIVE — AB

## 2020-04-30 ENCOUNTER — Other Ambulatory Visit: Payer: Self-pay

## 2020-04-30 ENCOUNTER — Ambulatory Visit (HOSPITAL_COMMUNITY)
Admission: EM | Admit: 2020-04-30 | Discharge: 2020-04-30 | Disposition: A | Discharge status: Home / Self Care | Payer: Medicaid Other | Attending: Emergency Medicine | Admitting: Emergency Medicine

## 2020-04-30 ENCOUNTER — Encounter (HOSPITAL_COMMUNITY): Payer: Self-pay

## 2020-04-30 DIAGNOSIS — F1193 Opioid use, unspecified with withdrawal: Secondary | ICD-10-CM

## 2020-04-30 MED ORDER — ONDANSETRON 4 MG PO TBDP
4.0000 mg | ORAL_TABLET | Freq: Once | ORAL | Status: AC
  Administered 2020-04-30: 4 mg via ORAL

## 2020-04-30 MED ORDER — KETOROLAC TROMETHAMINE 30 MG/ML IJ SOLN
INTRAMUSCULAR | Status: AC
  Filled 2020-04-30: qty 1

## 2020-04-30 MED ORDER — ONDANSETRON 4 MG PO TBDP
ORAL_TABLET | ORAL | Status: AC
  Filled 2020-04-30: qty 1

## 2020-04-30 MED ORDER — KETOROLAC TROMETHAMINE 30 MG/ML IJ SOLN
30.0000 mg | Freq: Once | INTRAMUSCULAR | Status: AC
  Administered 2020-04-30: 30 mg via INTRAMUSCULAR

## 2020-04-30 NOTE — ED Triage Notes (Signed)
Pt reports having body aches, feeling weak and vomiting x 4 days. Pt states the vomiting started after she stopped taking Percocet for recreational purpose. Pt reports se had a negative COVID test 2 days ago.

## 2020-04-30 NOTE — Discharge Instructions (Addendum)
Patient left prior to reassessment and discharge after telling the nurse she felt better from the Toradol and Zofran.   Will put Narcotics Anonymous number in chart for patient to access through her mychart.  [facility] Narcotics Anonymous [phone].

## 2020-04-30 NOTE — ED Provider Notes (Signed)
[facility]    CSN: [id number] Arrival date & time: 04/30/20  1607      History   Chief Complaint Chief Complaint  Patient presents with  . Generalized Body Aches  . Emesis    HPI Stacey Rodriguez is a 24 y.o. female.   24 yo tearful female here for evaluation of body aches, nausea, vomiting, and shaking x 4 days since stopping recreational percocet cold [place]. She says she has not been able to keep down food or fluids. Last emesis was last PM, tried to eat solids this AM but became nauseated so she stopped. She did not vomit. She has a long standing history of opioid abuse, was in ER at [facility] in April for OD.      Past Medical History:  Diagnosis Date  . Genital herpes   . Hx of chlamydia infection 2017  . Hx of trichomoniasis 2017    Patient Active Problem List   Diagnosis Date Noted  . [redacted] weeks gestation of pregnancy 08/21/2019  . Indication for care in labor and delivery, antepartum 08/20/2019  . GBS bacteriuria 07/25/2019  . GBS (group B Streptococcus carrier), +RV culture, currently pregnant 07/22/2019  . Alpha thalassemia silent carrier 05/15/2019  . Sickle cell trait (HCC) 05/15/2019  . Genital herpes affecting pregnancy 05/15/2019  . Supervision of other normal pregnancy, antepartum 01/27/2019  . Substance abuse (HCC) 11/11/2015  . Adjustment disorder with mixed anxiety and depressed mood 11/11/2015  . ECZEMA 05/27/2008    Past Surgical History:  Procedure Laterality Date  . CESAREAN SECTION N/A 08/21/2019   Procedure: CESAREAN SECTION;  Surgeon: Catalina Antigua, MD;  Location: [facility] LD ORS;  Service: Obstetrics;  Laterality: N/A;  . NO PAST SURGERIES      OB History    Gravida  1   Para  1   Term  1   Preterm      AB      Living  1     SAB      TAB      Ectopic      Multiple  0   Live Births  1            Home Medications    Prior to Admission medications   Medication Sig Start Date End Date Taking?  Authorizing Provider  acetaminophen (TYLENOL) 325 MG tablet Take 2 tablets (650 mg total) by mouth every 6 (six) hours as needed for moderate pain. Patient not taking: Reported on 12/06/2019 08/23/19   Joselyn Arrow, MD  albuterol (PROVENTIL HFA;VENTOLIN HFA) 108 (90 Base) MCG/ACT inhaler Inhale 1-2 puffs into the lungs every 6 (six) hours as needed for wheezing or shortness of breath. Patient not taking: Reported on 01/29/2020 12/13/17   Elvina Sidle, MD  AMBULATORY NON FORMULARY MEDICATION 1 Device by Other route once a week. Medication Name: Blood Pressure Cuff Monitored regularly at home  ICD 10 O09.90 Patient not taking: Reported on 08/05/2019 01/27/19   Marny Lowenstein, PA-C  doxycycline (VIBRAMYCIN) 100 MG capsule Take 1 capsule (100 mg total) by mouth 2 (two) times daily. Patient not taking: Reported on 12/06/2019 10/03/19   Moshe Cipro, NP  ibuprofen (ADVIL) 800 MG tablet Take 1 tablet (800 mg total) by mouth every 8 (eight) hours. Patient not taking: Reported on 12/06/2019 08/23/19   Joselyn Arrow, MD  ondansetron (ZOFRAN) 4 MG tablet Take 1 tablet (4 mg total) by mouth every 8 (eight) hours as needed for nausea or  vomiting. 01/29/20   Couture, Cortni S, PA-C  oxyCODONE (OXY IR/ROXICODONE) 5 MG immediate release tablet Take 1-2 tablets (5-10 mg total) by mouth every 4 (four) hours as needed for moderate pain. Patient not taking: Reported on 12/06/2019 08/23/19   Joselyn Arrow, MD  senna-docusate (SENOKOT-S) 8.6-50 MG tablet Take 2 tablets by mouth daily. Patient not taking: Reported on 12/06/2019 08/24/19   Joselyn Arrow, MD    Family History Family History  Problem Relation Age of Onset  . Asthma Mother   . Healthy Father     Social History Social History   Tobacco Use  . Smoking status: Never Smoker  . Smokeless tobacco: Never Used  Vaping Use  . Vaping Use: Never used  Substance Use Topics  . Alcohol use: Not Currently    Comment: ocassionally  . 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   Review of Systems Review of Systems  Constitutional: Positive for fatigue. Negative for activity change, appetite change, chills, diaphoresis and fever.  HENT: Negative for congestion, rhinorrhea, sinus pain and sore throat.   Respiratory: Negative for cough, choking, shortness of breath and wheezing.   Cardiovascular: Negative for chest pain.  Gastrointestinal: Positive for nausea and vomiting. Negative for abdominal pain and diarrhea.  Genitourinary: Negative.   Musculoskeletal: Positive for arthralgias and myalgias.  Neurological: Negative for tremors and headaches.  Psychiatric/Behavioral: The patient is nervous/anxious.      Physical Exam Triage Vital Signs ED Triage Vitals  Enc Vitals Group     BP 04/30/20 0925 (!) 137/107     Pulse Rate 04/30/20 0925 92     Resp 04/30/20 0925 (!) 24     Temp 04/30/20 0925 98.6 F (37 C)     Temp Source 04/30/20 0925 Oral     SpO2 04/30/20 0925 96 %     Weight --      Height --      Head Circumference --      Peak Flow --      Pain Score 04/30/20 0924 0     Pain Loc --      Pain Edu? --      Excl. in GC? --    No data found.  Updated Vital Signs BP (!) 137/107 (BP Location: Left Arm)   Pulse 92   Temp 98.6 F (37 C) (Oral)   Resp (!) 24   LMP 04/24/2020 (Exact Date)   SpO2 96%   Visual Acuity Right Eye Distance:   Left Eye Distance:   Bilateral Distance:    Right Eye Near:   Left Eye Near:    Bilateral Near:     Physical Exam Vitals and nursing note reviewed.  Constitutional:      Appearance: Normal appearance. She is not ill-appearing or diaphoretic.  HENT:     Head: Normocephalic and atraumatic.     Nose: Nose normal.     Mouth/Throat:     Mouth: Mucous membranes are moist.     Pharynx: Oropharynx is clear.  Eyes:     Extraocular  Movements: Extraocular movements intact.     Conjunctiva/sclera: Conjunctivae normal.     Pupils: Pupils are equal, round, and reactive to light.  Cardiovascular:     Rate and Rhythm: Normal rate and regular rhythm.  Pulses: Normal pulses.     Heart sounds: Normal heart sounds. No murmur heard.  No gallop.   Pulmonary:     Effort: Pulmonary effort is normal.     Breath sounds: Normal breath sounds.  Abdominal:     General: Abdomen is flat. Bowel sounds are normal.     Palpations: Abdomen is soft.     Tenderness: There is abdominal tenderness.     Comments: Generalized tenderness without focal findings. No guarding or peritoneal signs.   Musculoskeletal:        General: Normal range of motion.     Cervical back: Normal range of motion and neck supple.  Skin:    General: Skin is warm and dry.     Capillary Refill: Capillary refill takes less than 2 seconds.  Neurological:     General: No focal deficit present.     Mental Status: She is alert and oriented to person, place, and time.  Psychiatric:        Mood and Affect: Mood normal.        Behavior: Behavior normal.        Thought Content: Thought content normal.        Judgment: Judgment normal.      UC Treatments / Results  Labs (all labs ordered are listed, but only abnormal results are displayed) Labs Reviewed - No data to display  EKG   Radiology No results found.  Procedures Procedures (including critical care time)  Medications Ordered in UC Medications  ondansetron (ZOFRAN-ODT) disintegrating tablet 4 mg (4 mg Oral Given 04/30/20 1002)  ketorolac (TORADOL) 30 MG/ML injection 30 mg (30 mg Intramuscular Given 04/30/20 1002)    Initial Impression / Assessment and Plan / UC Course  I have reviewed the triage vital signs and the nursing notes.  Pertinent labs & imaging results that were available during my care of the patient were reviewed by me and considered in my medical decision making (see chart for  details).   Patient presents with body aches, nausea, vomiting, and fatigue after stopping recreational Percocet 4 days ago. BP is elevated- most likely secondary to opioid withdrawal.   Will give antiemetics and trial PO challenge. Will medicate with Toradol for body aches.  Plan to D/C home with resources for addiction treatment and NA.   Patient reported to nurse that she felt better and left prior to reassessment and discharge.    Final Clinical Impressions(s) / UC Diagnoses   Final diagnoses:  Opioid use with withdrawal [facility])     Discharge Instructions     Patient left prior to reassessment and discharge after telling the nurse she felt better from the Toradol and Zofran.   Will put Narcotics Anonymous number in chart for patient to access through her mychart.  [facility] Narcotics Anonymous [phone].    ED Prescriptions    None     PDMP not reviewed this encounter.   Becky Augusta, NP 04/30/20 1057

## 2020-04-30 NOTE — ED Notes (Addendum)
Pt left because she do not wanted to wait, and she felt better after the Toradol IM and Zofran OP.  Provider Alycia Rossetti NP was informed.

## 2020-05-19 ENCOUNTER — Other Ambulatory Visit: Payer: Self-pay

## 2020-05-19 ENCOUNTER — Encounter (HOSPITAL_COMMUNITY): Payer: Self-pay

## 2020-05-19 ENCOUNTER — Ambulatory Visit (HOSPITAL_COMMUNITY)
Admission: RE | Admit: 2020-05-19 | Discharge: 2020-05-19 | Disposition: A | Discharge status: Home / Self Care | Payer: Medicaid Other | Source: Ambulatory Visit | Attending: Family Medicine | Admitting: Family Medicine

## 2020-05-19 VITALS — BP 120/74 | HR 85 | Temp 99.0°F | Resp 16

## 2020-05-19 DIAGNOSIS — N898 Other specified noninflammatory disorders of vagina: Secondary | ICD-10-CM | POA: Diagnosis not present

## 2020-05-19 DIAGNOSIS — Z3202 Encounter for pregnancy test, result negative: Secondary | ICD-10-CM

## 2020-05-19 DIAGNOSIS — Z113 Encounter for screening for infections with a predominantly sexual mode of transmission: Secondary | ICD-10-CM | POA: Diagnosis not present

## 2020-05-19 LAB — POCT URINALYSIS DIPSTICK, ED / UC
Bilirubin Urine: NEGATIVE
Glucose, UA: NEGATIVE mg/dL
Ketones, ur: NEGATIVE mg/dL
Leukocytes,Ua: NEGATIVE
Nitrite: NEGATIVE
Protein, ur: NEGATIVE mg/dL
Specific Gravity, Urine: 1.02 (ref 1.005–1.030)
Urobilinogen, UA: 0.2 mg/dL (ref 0.0–1.0)
pH: 6 (ref 5.0–8.0)

## 2020-05-19 LAB — HIV ANTIBODY (ROUTINE TESTING W REFLEX): HIV Screen 4th Generation wRfx: NONREACTIVE

## 2020-05-19 LAB — POC URINE PREG, ED: Preg Test, Ur: NEGATIVE

## 2020-05-19 NOTE — Discharge Instructions (Addendum)
You can check your my chart for results or we will call with any positive results.

## 2020-05-19 NOTE — ED Triage Notes (Signed)
Pt c/o vaginal discharge and odor x 2 weeks. Pt states she thinks she has BV. She has the nexplannon. She request HIV and syphillis testing as well.

## 2020-05-20 LAB — CERVICOVAGINAL ANCILLARY ONLY
Bacterial Vaginitis (gardnerella): POSITIVE — AB
Candida Glabrata: NEGATIVE
Candida Vaginitis: NEGATIVE
Chlamydia: NEGATIVE
Comment: NEGATIVE
Comment: NEGATIVE
Comment: NEGATIVE
Comment: NEGATIVE
Comment: NEGATIVE
Comment: NORMAL
Neisseria Gonorrhea: NEGATIVE
Trichomonas: NEGATIVE

## 2020-05-20 LAB — RPR: RPR Ser Ql: NONREACTIVE

## 2020-05-20 NOTE — ED Provider Notes (Signed)
[facility]    CSN: [id number] Arrival date & time: 05/19/20  1159      History   Chief Complaint Chief Complaint  Patient presents with   Appointment   Vaginal Discharge   SEXUALLY TRANSMITTED DISEASE    HPI Stacey Rodriguez is a 24 y.o. female.   Patient is a 24 year old female past medical history of genital herpes, chlamydia, trichomonas.  She presents today with vaginal discharge, odor x2 weeks.  Concern for BV based on symptoms and history.  She has Her birth control.  Denies any abdominal pain.  She would also like HIV and syphilis testing. Patient's last menstrual period was 04/24/2020 (exact date).      Past Medical History:  Diagnosis Date   Genital herpes    Hx of chlamydia infection 2017   Hx of trichomoniasis 2017    Patient Active Problem List   Diagnosis Date Noted   [redacted] weeks gestation of pregnancy 08/21/2019   Indication for care in labor and delivery, antepartum 08/20/2019   GBS bacteriuria 07/25/2019   GBS (group B Streptococcus carrier), +RV culture, currently pregnant 07/22/2019   Alpha thalassemia silent carrier 05/15/2019   Sickle cell trait (HCC) 05/15/2019   Genital herpes affecting pregnancy 05/15/2019   Supervision of other normal pregnancy, antepartum 01/27/2019   Substance abuse (HCC) 11/11/2015   Adjustment disorder with mixed anxiety and depressed mood 11/11/2015   ECZEMA 05/27/2008    Past Surgical History:  Procedure Laterality Date   CESAREAN SECTION N/A 08/21/2019   Procedure: CESAREAN SECTION;  Surgeon: Catalina Antigua, MD;  Location: [facility];  Service: Obstetrics;  Laterality: N/A;   NO PAST SURGERIES      OB History    Gravida  1   Para  1   Term  1   Preterm      AB      Living  1     SAB      TAB      Ectopic      Multiple  0   Live Births  1            Home Medications    Prior to Admission medications   Medication Sig Start Date End Date Taking?  Authorizing Provider  albuterol (PROVENTIL HFA;VENTOLIN HFA) 108 (90 Base) MCG/ACT inhaler Inhale 1-2 puffs into the lungs every 6 (six) hours as needed for wheezing or shortness of breath. 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   Review of Systems Review of Systems   Physical Exam Triage Vital Signs ED Triage Vitals  Enc Vitals Group     BP 05/19/20 1233 120/74     Pulse Rate 05/19/20 1233 85     Resp 05/19/20 1233 16     Temp 05/19/20 1233 99 F (37.2 C)  Temp Source 05/19/20 1233 Oral     SpO2 05/19/20 1233 98 %     Weight --      Height --      Head Circumference --      Peak Flow --      Pain Score 05/19/20 1229 0     Pain Loc --      Pain Edu? --      Excl. in GC? --    No data found.  Updated Vital Signs BP 120/74 (BP Location: Left Arm)    Pulse 85    Temp 99 F (37.2 C) (Oral)    Resp 16    LMP 04/24/2020 (Exact Date)    SpO2 98%   Visual Acuity Right Eye Distance:   Left Eye Distance:   Bilateral Distance:    Right Eye Near:   Left Eye Near:    Bilateral Near:     Physical Exam Vitals and nursing note reviewed.  Constitutional:      General: She is not in acute distress.    Appearance: Normal appearance. She is not ill-appearing, toxic-appearing or diaphoretic.  HENT:     Head: Normocephalic.     Nose: Nose normal.  Eyes:     Conjunctiva/sclera: Conjunctivae normal.    Pulmonary:     Effort: Pulmonary effort is normal.  Musculoskeletal:        General: Normal range of motion.     Cervical back: Normal range of motion.  Skin:    General: Skin is warm and dry.     Findings: No rash.  Neurological:     Mental Status: She is alert.  Psychiatric:        Mood and Affect: Mood normal.      UC Treatments / Results  Labs (all labs ordered are listed, but only abnormal results are displayed) Labs Reviewed  POCT URINALYSIS DIPSTICK, ED / UC - Abnormal; Notable for the following components:      Result Value   Hgb urine dipstick SMALL (*)    All other components within normal limits  CERVICOVAGINAL ANCILLARY ONLY - Abnormal; Notable for the following components:   Bacterial Vaginitis (gardnerella) Positive (*)    All other components within normal limits  HIV ANTIBODY (ROUTINE TESTING W REFLEX)  RPR  POC URINE PREG, ED    EKG   Radiology No results found.  Procedures Procedures (including critical care time)  Medications Ordered in UC Medications - No data to display  Initial Impression / Assessment and Plan / UC Course  I have reviewed the triage vital signs and the nursing notes.  Pertinent labs & imaging results that were available during my care of the patient were reviewed by me and considered in my medical decision making (see chart for details).     Screening for STDs Vaginal discharge We will go ahead and treat for BV today based on symptoms and history.  Sending swab for testing.  Urine without infection. Follow up as needed for continued or worsening symptoms  Final Clinical Impressions(s) / UC Diagnoses   Final diagnoses:  Screening for STD (sexually transmitted disease)  Vaginal discharge     Discharge Instructions     You can check your my chart for results or we will call with any positive results.     ED Prescriptions    None     PDMP not reviewed this encounter.   Dahlia Byes A, NP 05/20/20 1603

## 2020-05-21 ENCOUNTER — Telehealth (HOSPITAL_COMMUNITY): Payer: Self-pay | Admitting: Emergency Medicine

## 2020-05-21 MED ORDER — METRONIDAZOLE 500 MG PO TABS: 500.0000 mg | ORAL_TABLET | Freq: Two times a day (BID) | ORAL | 0 refills | Status: DC

## 2020-06-28 ENCOUNTER — Other Ambulatory Visit: Payer: Self-pay

## 2020-06-28 ENCOUNTER — Ambulatory Visit (HOSPITAL_COMMUNITY)
Admission: EM | Admit: 2020-06-28 | Discharge: 2020-06-28 | Disposition: A | Discharge status: Home / Self Care | Payer: Medicaid Other | Attending: Family Medicine | Admitting: Family Medicine

## 2020-06-28 ENCOUNTER — Encounter (HOSPITAL_COMMUNITY): Payer: Self-pay

## 2020-06-28 DIAGNOSIS — N760 Acute vaginitis: Secondary | ICD-10-CM | POA: Insufficient documentation

## 2020-06-28 MED ORDER — FLUCONAZOLE 150 MG PO TABS
150.0000 mg | ORAL_TABLET | Freq: Once | ORAL | 0 refills | Status: AC
Start: 2020-06-28 — End: 2020-06-28

## 2020-06-28 NOTE — ED Triage Notes (Signed)
Pt presents with vaginal discharge and itching X 3 days.

## 2020-06-28 NOTE — ED Provider Notes (Signed)
[facility]    CSN: [id number] Arrival date & time: 06/28/20  0300      History   Chief Complaint Chief Complaint  Patient presents with  . Vaginitis    HPI Stacey Rodriguez is a 24 y.o. female.   Patient presenting today with 3 days of white clumpy vaginal discharge, vaginal itching and irritation. Denies dysuria, pelvic pain, urinary frequency, rashes, N/V/D, concern for STIs. Has not tried anything OTC for sxs.      Past Medical History:  Diagnosis Date  . Genital herpes   . Hx of chlamydia infection 2017  . Hx of trichomoniasis 2017    Patient Active Problem List   Diagnosis Date Noted  . [redacted] weeks gestation of pregnancy 08/21/2019  . Indication for care in labor and delivery, antepartum 08/20/2019  . GBS bacteriuria 07/25/2019  . GBS (group B Streptococcus carrier), +RV culture, currently pregnant 07/22/2019  . Alpha thalassemia silent carrier 05/15/2019  . Sickle cell trait (HCC) 05/15/2019  . Genital herpes affecting pregnancy 05/15/2019  . Supervision of other normal pregnancy, antepartum 01/27/2019  . Substance abuse (HCC) 11/11/2015  . Adjustment disorder with mixed anxiety and depressed mood 11/11/2015  . ECZEMA 05/27/2008    Past Surgical History:  Procedure Laterality Date  . CESAREAN SECTION N/A 08/21/2019   Procedure: CESAREAN SECTION;  Surgeon: Catalina Antigua, MD;  Location: [facility];  Service: Obstetrics;  Laterality: N/A;  . NO PAST SURGERIES      OB History    Gravida  1   Para  1   Term  1   Preterm      AB      Living  1     SAB      TAB      Ectopic      Multiple  0   Live Births  1            Home Medications    Prior to Admission medications   Medication Sig Start Date End Date Taking? Authorizing Provider  fluconazole (DIFLUCAN) 150 MG tablet Take 1 tablet (150 mg total) by mouth once for 1 dose. 06/28/20 06/28/20  Particia Nearing, PA-C  metroNIDAZOLE (FLAGYL) 500 MG tablet Take 1  tablet (500 mg total) by mouth 2 (two) times daily. 05/21/20   Lamptey, Britta Mccreedy, MD  albuterol (PROVENTIL HFA;VENTOLIN HFA) 108 (90 Base) MCG/ACT inhaler Inhale 1-2 puffs into the lungs every 6 (six) hours as needed for wheezing or shortness of breath. Patient not taking: Reported on 01/29/2020 12/13/17 05/19/20  Elvina Sidle, MD    Family History Family History  Problem Relation Age of Onset  . Asthma Mother   . Healthy Father     Social History Social History   Tobacco Use  . Smoking status: Never Smoker  . Smokeless tobacco: Never Used  Vaping Use  . Vaping Use: Never used  Substance Use Topics  . Alcohol use: Not Currently    Comment: ocassionally  . 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   Review of Systems Review of Systems PER HPI    Physical Exam Triage Vital Signs ED Triage Vitals  Enc Vitals Group     BP 06/28/20 0942 102/69     Pulse Rate 06/28/20 0942 85  Resp 06/28/20 0942 18     Temp 06/28/20 0942 98 F (36.7 C)     Temp Source 06/28/20 0942 Oral     SpO2 06/28/20 0942 99 %     Weight --      Height --      Head Circumference --      Peak Flow --      Pain Score 06/28/20 0941 0     Pain Loc --      Pain Edu? --      Excl. in GC? --    No data found.  Updated Vital Signs BP 102/69 (BP Location: Left Arm)   Pulse 85   Temp 98 F (36.7 C) (Oral)   Resp 18   LMP 06/13/2020   SpO2 99%   Visual Acuity Right Eye Distance:   Left Eye Distance:   Bilateral Distance:    Right Eye Near:   Left Eye Near:    Bilateral Near:     Physical Exam Vitals and nursing note reviewed.  Constitutional:      Appearance: Normal appearance. She is not ill-appearing.  HENT:     Head: Atraumatic.  Eyes:     Extraocular Movements: Extraocular movements intact.     Conjunctiva/sclera: Conjunctivae  normal.  Cardiovascular:     Rate and Rhythm: Normal rate and regular rhythm.     Heart sounds: Normal heart sounds.  Pulmonary:     Effort: Pulmonary effort is normal.     Breath sounds: Normal breath sounds.  Abdominal:     General: Bowel sounds are normal. There is no distension.     Palpations: Abdomen is soft.     Tenderness: There is no abdominal tenderness. There is no guarding.  Genitourinary:    Comments: GU exam deferred, self swab performed Musculoskeletal:        General: Normal range of motion.     Cervical back: Normal range of motion and neck supple.  Skin:    General: Skin is warm and dry.  Neurological:     Mental Status: She is alert and oriented to person, place, and time.  Psychiatric:        Mood and Affect: Mood normal.        Thought Content: Thought content normal.        Judgment: Judgment normal.      UC Treatments / Results  Labs (all labs ordered are listed, but only abnormal results are displayed) Labs Reviewed  CERVICOVAGINAL ANCILLARY ONLY    EKG   Radiology No results found.  Procedures Procedures (including critical care time)  Medications Ordered in UC Medications - No data to display  Initial Impression / Assessment and Plan / UC Course  I have reviewed the triage vital signs and the nursing notes.  Pertinent labs & imaging results that were available during my care of the patient were reviewed by me and considered in my medical decision making (see chart for details).     Aptima swab pending, declines HIV or syphilis testing as she is not concerned about STIs. WIll start diflucan while awaiting results for possible yeast vaginitis. Treat based on swab results further than this. Discussed probiotics, good vaginal hygiene practices.   Final Clinical Impressions(s) / UC Diagnoses   Final diagnoses:  Acute vaginitis   Discharge Instructions   None    ED Prescriptions    Medication Sig Dispense Auth. Provider    fluconazole (DIFLUCAN) 150 MG tablet Take 1 tablet (  150 mg total) by mouth once for 1 dose. 1 tablet Particia Nearing, [place]     PDMP not reviewed this encounter.   Particia Nearing, [place] 06/28/20 1012

## 2020-06-29 LAB — CERVICOVAGINAL ANCILLARY ONLY
Bacterial Vaginitis (gardnerella): POSITIVE — AB
Candida Glabrata: NEGATIVE
Candida Vaginitis: POSITIVE — AB
Chlamydia: POSITIVE — AB
Comment: NEGATIVE
Comment: NEGATIVE
Comment: NEGATIVE
Comment: NEGATIVE
Comment: NEGATIVE
Comment: NORMAL
Neisseria Gonorrhea: NEGATIVE
Trichomonas: NEGATIVE

## 2020-06-30 ENCOUNTER — Telehealth (HOSPITAL_COMMUNITY): Payer: Self-pay | Admitting: Emergency Medicine

## 2020-06-30 MED ORDER — AZITHROMYCIN 250 MG PO TABS
1000.0000 mg | ORAL_TABLET | Freq: Once | ORAL | 0 refills | Status: AC
Start: 2020-06-30 — End: 2020-06-30

## 2020-06-30 MED ORDER — METRONIDAZOLE 500 MG PO TABS: 500.0000 mg | ORAL_TABLET | Freq: Two times a day (BID) | ORAL | 0 refills | Status: DC

## 2020-09-21 ENCOUNTER — Other Ambulatory Visit: Payer: Self-pay

## 2020-09-21 ENCOUNTER — Emergency Department (HOSPITAL_COMMUNITY)
Admission: EM | Admit: 2020-09-21 | Discharge: 2020-09-21 | Disposition: A | Discharge status: Home / Self Care | Payer: Medicaid Other | Attending: Emergency Medicine | Admitting: Emergency Medicine

## 2020-09-21 ENCOUNTER — Encounter (HOSPITAL_COMMUNITY): Payer: Self-pay | Admitting: *Deleted

## 2020-09-21 DIAGNOSIS — R531 Weakness: Secondary | ICD-10-CM | POA: Diagnosis not present

## 2020-09-21 DIAGNOSIS — F1123 Opioid dependence with withdrawal: Secondary | ICD-10-CM | POA: Insufficient documentation

## 2020-09-21 DIAGNOSIS — F1190 Opioid use, unspecified, uncomplicated: Secondary | ICD-10-CM

## 2020-09-21 LAB — COMPREHENSIVE METABOLIC PANEL
ALT: 34 U/L (ref 0–44)
AST: 33 U/L (ref 15–41)
Albumin: 5.4 g/dL — ABNORMAL HIGH (ref 3.5–5.0)
Alkaline Phosphatase: 54 U/L (ref 38–126)
Anion gap: 13 (ref 5–15)
BUN: 12 mg/dL (ref 6–20)
CO2: 28 mmol/L (ref 22–32)
Calcium: 10.7 mg/dL — ABNORMAL HIGH (ref 8.9–10.3)
Chloride: 94 mmol/L — ABNORMAL LOW (ref 98–111)
Creatinine, Ser: 1.15 mg/dL — ABNORMAL HIGH (ref 0.44–1.00)
GFR, Estimated: >60 mL/min (ref >60)
Glucose, Bld: 107 mg/dL — ABNORMAL HIGH (ref 70–99)
Potassium: 3.8 mmol/L (ref 3.5–5.1)
Sodium: 135 mmol/L (ref 135–145)
Total Bilirubin: 1.9 mg/dL — ABNORMAL HIGH (ref 0.3–1.2)
Total Protein: 9.3 g/dL — ABNORMAL HIGH (ref 6.5–8.1)

## 2020-09-21 LAB — CBC
HCT: 48 % — ABNORMAL HIGH (ref 36.0–46.0)
Hemoglobin: 17 g/dL — ABNORMAL HIGH (ref 12.0–15.0)
MCH: 31 pg (ref 26.0–34.0)
MCHC: 35.4 g/dL (ref 30.0–36.0)
MCV: 87.6 fL (ref 80.0–100.0)
Platelets: 238 10*3/uL (ref 150–400)
RBC: 5.48 MIL/uL — ABNORMAL HIGH (ref 3.87–5.11)
RDW: 13.5 % (ref 11.5–15.5)
WBC: 7.1 10*3/uL (ref 4.0–10.5)
nRBC: 0 % (ref 0.0–0.2)

## 2020-09-21 LAB — I-STAT BETA HCG BLOOD, ED (MC, WL, AP ONLY): I-stat hCG, quantitative: <5 m[IU]/mL (ref <5)

## 2020-09-21 LAB — ETHANOL: Alcohol, Ethyl (B): <10 mg/dL (ref <10)

## 2020-09-21 MED ORDER — ONDANSETRON HCL 4 MG/2ML IJ SOLN
4.0000 mg | Freq: Once | INTRAMUSCULAR | Status: AC
  Administered 2020-09-21: 4 mg via INTRAVENOUS
  Filled 2020-09-21: qty 2

## 2020-09-21 MED ORDER — ONDANSETRON HCL 4 MG PO TABS: 4.0000 mg | ORAL_TABLET | Freq: Three times a day (TID) | ORAL | 0 refills | Status: DC | PRN

## 2020-09-21 MED ORDER — SODIUM CHLORIDE 0.9 % IV BOLUS
1000.0000 mL | Freq: Once | INTRAVENOUS | Status: AC
  Administered 2020-09-21: 1000 mL via INTRAVENOUS

## 2020-09-21 NOTE — ED Triage Notes (Signed)
Pt crying at triage, reports withdrawing from alcohol and percocet, last used both on Saturday. Pt very anxious at triage. Denies SI or Hi.

## 2020-09-21 NOTE — Discharge Instructions (Signed)
You were given a prescription for zofran to help with your nausea. Please take as directed.  Use the resources provided regarding your substance use  Please follow up with your primary care provider within 5-7 days for re-evaluation of your symptoms. If you do not have a primary care provider, information for a healthcare clinic has been provided for you to make arrangements for follow up care. Please return to the emergency department for any new or worsening symptoms.

## 2020-09-21 NOTE — ED Notes (Addendum)
Pt drinking a bottled water when nurse entered room. Pt given a cup of water and graham crackers.

## 2020-09-21 NOTE — ED Provider Notes (Signed)
[facility] EMERGENCY DEPARTMENT Provider Note   CSN: [id number] Arrival date & time: 09/21/20  1657     History Chief Complaint  Patient presents with  . Alcohol Problem    Stacey Rodriguez is a 25 y.o. female.  HPI   Pt is a 25 y/o female who presents to the ED today for eval of withdrawal sxs. States she take about 4 tablets of percocet every day. She has not had any in about 3-4 days. She reports nausea, vomiting, diarrhea, insomnia, body aches, and generalized weakness.  States she drinks about 1 time per week. She does not drink heavily.   Past Medical History:  Diagnosis Date  . Genital herpes   . Hx of chlamydia infection 2017  . Hx of trichomoniasis 2017    Patient Active Problem List   Diagnosis Date Noted  . [redacted] weeks gestation of pregnancy 08/21/2019  . Indication for care in labor and delivery, antepartum 08/20/2019  . GBS bacteriuria 07/25/2019  . GBS (group B Streptococcus carrier), +RV culture, currently pregnant 07/22/2019  . Alpha thalassemia silent carrier 05/15/2019  . Sickle cell trait (HCC) 05/15/2019  . Genital herpes affecting pregnancy 05/15/2019  . Supervision of other normal pregnancy, antepartum 01/27/2019  . Substance abuse (HCC) 11/11/2015  . Adjustment disorder with mixed anxiety and depressed mood 11/11/2015  . ECZEMA 05/27/2008    Past Surgical History:  Procedure Laterality Date  . CESAREAN SECTION N/A 08/21/2019   Procedure: CESAREAN SECTION;  Surgeon: Catalina Antigua, MD;  Location: [facility];  Service: Obstetrics;  Laterality: N/A;  . NO PAST SURGERIES       OB History    Gravida  1   Para  1   Term  1   Preterm      AB      Living  1     SAB      IAB      Ectopic      Multiple  0   Live Births  1           Family History  Problem Relation Age of Onset  . Asthma Mother   . Healthy Father     Social History   Tobacco Use  . Smoking status: Never Smoker  . Smokeless tobacco:  Never Used  Vaping Use  . Vaping Use: Never used  Substance Use Topics  . Alcohol use: Not Currently    Comment: ocassionally  . 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 Authorizing Provider  metroNIDAZOLE (FLAGYL) 500 MG tablet Take 1 tablet (500 mg total) by mouth 2 (two) times daily. 06/30/20   Lamptey, Britta Mccreedy, MD  ondansetron (ZOFRAN) 4 MG tablet Take 1 tablet (4 mg total) by mouth every 8 (eight) hours as needed for nausea or vomiting. 09/21/20   Elder Davidian S, PA-C  albuterol (PROVENTIL HFA;VENTOLIN HFA) 108 (90 Base) MCG/ACT inhaler Inhale 1-2 puffs into the lungs every 6 (six) hours as needed for wheezing or shortness of breath. Patient not taking: Reported on 01/29/2020 12/13/17 05/19/20  Elvina Sidle, MD    Allergies    Patient has no known allergies.  Review of Systems   Review of Systems  Constitutional: Positive for chills. Negative for fever.  HENT: Negative  for ear pain and sore throat.   Eyes: Negative for pain and visual disturbance.  Respiratory: Negative for cough and shortness of breath.   Cardiovascular: Negative for chest pain.  Gastrointestinal: Positive for diarrhea, nausea and vomiting. Negative for abdominal pain.  Genitourinary: Negative for dysuria and hematuria.  Musculoskeletal: Negative for back pain.  Skin: Negative for rash.  Neurological: Positive for weakness. Negative for headaches.  All other systems reviewed and are negative.   Physical Exam Updated Vital Signs BP 137/89   Pulse (!) 105   Temp 98.5 F (36.9 C)   Resp 20   SpO2 99%   Physical Exam Vitals and nursing note reviewed.  Constitutional:      General: She is not in acute distress.    Appearance: She is well-developed and well-nourished.  HENT:     Head: Normocephalic and  atraumatic.  Eyes:     Conjunctiva/sclera: Conjunctivae normal.  Cardiovascular:     Rate and Rhythm: Regular rhythm. Tachycardia present.     Heart sounds: Normal heart sounds. No murmur heard.   Pulmonary:     Effort: Pulmonary effort is normal. No respiratory distress.     Breath sounds: Normal breath sounds. No wheezing, rhonchi or rales.  Abdominal:     General: Bowel sounds are normal.     Palpations: Abdomen is soft.     Tenderness: There is no abdominal tenderness. There is no guarding or rebound.  Musculoskeletal:        General: No edema.     Cervical back: Neck supple.  Skin:    General: Skin is warm and dry.  Neurological:     Mental Status: She is alert.  Psychiatric:        Mood and Affect: Mood and affect normal.     ED Results / Procedures / Treatments   Labs (all labs ordered are listed, but only abnormal results are displayed) Labs Reviewed  COMPREHENSIVE METABOLIC PANEL - Abnormal; Notable for the following components:      Result Value   Chloride 94 (*)    Glucose, Bld 107 (*)    Creatinine, Ser 1.15 (*)    Calcium 10.7 (*)    Total Protein 9.3 (*)    Albumin 5.4 (*)    Total Bilirubin 1.9 (*)    All other components within normal limits  CBC - Abnormal; Notable for the following components:   RBC 5.48 (*)    Hemoglobin 17.0 (*)    HCT 48.0 (*)    All other components within normal limits  ETHANOL  I-STAT BETA HCG BLOOD, ED ([facility], [facility], [facility] ONLY)    EKG EKG Interpretation  Date/Time:  Tuesday September 21 2020 20:39:32 EST Ventricular Rate:  86 PR Interval:    QRS Duration: 72 QT Interval:  389 QTC Calculation: 466 R Axis:   71 Text Interpretation: Sinus arrhythmia Right atrial enlargement LVH by voltage ST elevation, consider inferior injury Early repolarization unchanged from previous tracings Confirmed by Arby Barrette [phone]) on 09/21/2020 8:46:12 PM   Radiology No results found.  Procedures Procedures   Medications Ordered in  ED Medications  sodium chloride 0.9 % bolus 1,000 mL (1,000 mLs Intravenous New Bag/Given 09/21/20 2016)  ondansetron (ZOFRAN) injection 4 mg (4 mg Intravenous Given 09/21/20 2014)    ED Course  I have reviewed the triage vital signs and the nursing notes.  Pertinent labs & imaging results that were available during my care of the patient were reviewed by me and  considered in my medical decision making (see chart for details).    MDM Rules/Calculators/A&P                          25 y/o F presenting for eval of withdrawal from opiates  Reviewed/interpreted labs Cbc with elevated hgb, likely secondary to hemoconcentration cmp with mildly elevated cr, likely prerenal, mildly elevated bili, likely reactive, otherwise reassuring Beta hcg neg  EKG - Sinus arrhythmia Right atrial enlargement LVH by voltage ST elevation, consider inferior injury Early repolarization unchanged from previous tracings   Pt feeling improved and tolerating po after zofran and ivf. She is requesting to be discharged. Discussed typical course of opioid withdrawal. Will give rx for zofran. I do not suspect etoh withdrawal as she states she does not drink heavily. Resources for substance use counseling given and peer support consulted. Advised on f/u and return precautions. She voices understanding and is in agreement with the plan. All questions answered, pt stable for discharge.  Final Clinical Impression(s) / ED Diagnoses Final diagnoses:  Opioid use disorder    Rx / DC Orders ED Discharge Orders         Ordered    Consult to Peer Support       Provider:  (Not yet assigned)   09/21/20 2046    ondansetron (ZOFRAN) 4 MG tablet  Every 8 hours PRN,   Status:  Discontinued        09/21/20 2046    ondansetron (ZOFRAN) 4 MG tablet  Every 8 hours PRN        09/21/20 2046           Karrie Meres, PA-C 09/21/20 2048    Arby Barrette, MD 10/04/20 1754

## 2020-09-21 NOTE — ED Notes (Signed)
Pt requesting to leave.

## 2020-11-22 ENCOUNTER — Other Ambulatory Visit (HOSPITAL_COMMUNITY)
Admission: RE | Admit: 2020-11-22 | Discharge: 2020-11-22 | Disposition: A | Discharge status: Home / Self Care | Payer: Medicaid Other | Source: Ambulatory Visit | Attending: Primary Care | Admitting: Primary Care

## 2020-11-22 ENCOUNTER — Encounter (INDEPENDENT_AMBULATORY_CARE_PROVIDER_SITE_OTHER): Payer: Self-pay | Admitting: Primary Care

## 2020-11-22 ENCOUNTER — Other Ambulatory Visit: Payer: Self-pay

## 2020-11-22 ENCOUNTER — Ambulatory Visit (INDEPENDENT_AMBULATORY_CARE_PROVIDER_SITE_OTHER): Payer: Medicaid Other | Admitting: Primary Care

## 2020-11-22 VITALS — BP 100/65 | HR 99 | Temp 97.2°F | Resp 16

## 2020-11-22 DIAGNOSIS — Z113 Encounter for screening for infections with a predominantly sexual mode of transmission: Secondary | ICD-10-CM

## 2020-11-22 DIAGNOSIS — Z09 Encounter for follow-up examination after completed treatment for conditions other than malignant neoplasm: Secondary | ICD-10-CM | POA: Diagnosis not present

## 2020-11-22 DIAGNOSIS — Z7689 Persons encountering health services in other specified circumstances: Secondary | ICD-10-CM

## 2020-11-22 NOTE — Progress Notes (Signed)
New Patient Office Visit  Subjective:  Patient ID: Stacey Rodriguez, female    DOB: Dec 01, 1995  Age: 25 y.o. MRN: [id number]  CC:  Chief Complaint  Patient presents with  . Establish Care    HPI Ms. Stacey Rodriguez is a  25 y.o.female presents for follow up from the emergency room on  09/21/20, for Opioid use disorder with withdrawals. She is concerned with vaginal discharge and itching began  2 ago  She describes discharge as thin and white . She has been in precipitating event, recent sexual encounter or recent antibiotic use.(BV tx- flagyl 500 mg bid for 7 days completed dose.)   Patient is sexually active with 1  Female.  She denies fever, chills, nausea, vomiting, abdominal or pelvic pain, urinary symptoms and vaginal odor  Past Medical History:  Diagnosis Date  . Genital herpes   . Hx of chlamydia infection 2017  . Hx of trichomoniasis 2017    Past Surgical History:  Procedure Laterality Date  . CESAREAN SECTION N/A 08/21/2019   Procedure: CESAREAN SECTION;  Surgeon: Catalina Antigua, MD;  Location: [facility];  Service: Obstetrics;  Laterality: N/A;  . NO PAST SURGERIES      Family History  Problem Relation Age of Onset  . Asthma Mother   . Healthy Father     Social History   Socioeconomic History  . Marital status: Single    Spouse name: Not on file  . Number of children: Not on file  . Years of education: Not on file  . Highest education level: Not on file  Occupational History  . Not on file  Tobacco Use  . Smoking status: Never Smoker  . Smokeless tobacco: Never Used  Vaping Use  . Vaping Use: Never used  Substance and Sexual Activity  . Alcohol use: Not Currently    Comment: ocassionally  . 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 Sexual activity: Yes    Birth control/protection: None  Other Topics Concern  . Not on file  Social History  Narrative   ** Merged History Encounter **       Social Determinants of Health   Financial Resource Strain: Not on file  Food Insecurity: Not on file  Transportation Needs: Not on file  Physical Activity: Not on file  Stress: Not on file  Social Connections: Not on file  Intimate Partner Violence: Not on file    ROS Review of Systems  Genitourinary: Positive for vaginal discharge.  All other systems reviewed and are negative.   Objective:   Today's Vitals: BP 100/65   Pulse 99   Temp (!) 97.2 F (36.2 C)   Resp 16   LMP 11/01/2020 (LMP Unknown)   SpO2 98%   Physical Exam General Appearance: Well nourished, in no apparent distress. Eyes: PERRLA, EOMs, conjunctiva no swelling or erythema Sinuses: No Frontal/maxillary tenderness ENT/Mouth: Ext aud canals clear, TMs without erythema, bulging.Hearing normal.  Neck: Supple, thyroid normal.  Respiratory: Respiratory effort normal, BS equal bilaterally without rales, rhonchi, wheezing or stridor.  Cardio: RRR with no MRGs. Brisk peripheral pulses without edema.  Abdomen: Soft, + BS.  Non tender, no guarding, rebound, hernias, masses. Lymphatics: Non tender without lymphadenopathy.  Musculoskeletal: Full ROM, 5/5 strength, normal gait.  Skin: Warm, dry without rashes, lesions, ecchymosis.  Neuro. Normal muscle tone, no cerebellar symptoms.Psych: Awake and  oriented X 3, normal affect, Insight and Judgment appropriate.   Assessment & Plan:  [name] was seen today for establish care.  Diagnoses and all orders for this visit:  Encounter to establish care Establish care with new provider   Hospital discharge follow-up  Seen in the emergency room on  09/21/20, for Opioid use disorder with withdrawals. She has not had any opiods since  09/21/20. Discharge note recommend appt/PCP   Screening examination for sexually transmitted disease -     Cervicovaginal ancillary only   Follow-up: Return if symptoms worsen or fail to improve.    Grayce Sessions, NP

## 2020-11-22 NOTE — Patient Instructions (Signed)
Preventing Sexually Transmitted Infections, Adult Sexually transmitted infections (STIs) are diseases that are spread from person to person (are contagious). They are spread, or transmitted, through bodily fluids exchanged during sex or sexual contact. These bodily fluids include saliva, semen, blood, vaginal mucus, and urine. STIs are very common among people of all ages. Some common STIs include:  Herpes.  Hepatitis B.  Chlamydia.  Gonorrhea.  Syphilis.  HPV (human papillomavirus).  HIV, also called the human immunodeficiency virus. This is the virus that can cause AIDS (acquired immunodeficiency syndrome). Often, people who have these STIs do not have symptoms. Even without symptoms, these infections can be spread from person to person and require treatment. How can these conditions affect me? STIs can be treated, and many STIs can be cured. However, some STIs cannot be cured and will affect you for the rest of your life. Certain STIs may:  Require you to take medicine for the rest of your life.  Affect your ability to have children (your fertility).  Increase your risk for developing another STI or certain serious health conditions. These may include: ? Cervical cancer. ? Head and neck cancer. ? Pelvic inflammatory disease (PID), in women. ? Organ damage or damage to other parts of your body, if the infection spreads.  Cause problems during pregnancy and may be transmitted to the baby during the pregnancy or childbirth. What can increase my risk? You may have an increased risk for developing an STI if:  You have unprotected sex. Sex includes oral, vaginal, or anal sex.  You have more than one sex partner.  You have a sex partner who has multiple sex partners.  You have sex with someone who has an STI.  You have an STI or you had an STI before.  You inject drugs or have a sex partner or partners who inject drugs. What actions can I take to prevent STIs? The only way  to completely prevent STIs is not to have sex of any kind. This is called practicing abstinence. If you are sexually active, you can protect yourself and others by taking these actions to lower your risk of getting an STI: Lifestyle Avoid mixing alcohol, drugs, and sex. Alcohol and drug use can affect your ability to make good decisions and can lead to risky sexual behaviors. Medicines Ask your health care provider about taking pre-exposure prophylaxis (PrEP) to prevent HIV infection. General information  Stay up to date on vaccinations. Certain vaccines can lower your risk of getting certain STIs, such as: ? Hepatitis A and hepatitis B vaccines. You may have been vaccinated as a young child, but you will likely need a booster shot as a teen or young adult. ? HPV (human papillomavirus) vaccine.  Have only one sex partner (be monogamous) or limit the number of sex partners you have.  Use methods that prevent the exchange of body fluids between partners (barrier protection) correctly every time you have sex. Barrier protection can be used during oral, vaginal, or anal sex. Commonly used barrier methods include: ? Female condom. ? Female condom. ? Dental dam.  Use a new condom for every sex act from start to finish.  Get tested for STIs. Have your partners get tested, too.  If you test positive for an STI, follow recommendations from your health care provider about treatment and make sure your sex partners are tested and treated.  Birth control pills, injections, implants, and intrauterine devices (IUDs) do not protect against STIs. To prevent both STIs and   pregnancy, always use a condom with another form of birth control.  Some STIs, such as herpes, are spread through skin-to-skin contact. A condom may not protect you from getting such STIs. Avoid all sexual contact if you or your partners have herpes and there is an active flare with open sores.   Where to find more information Learn more  about STIs from:  Centers for Disease Control and Prevention: ? More information about specific STIs: cdc.gov/std ? Places to get sexual health counseling and treatment for free or at a low cost: gettested.cdc.gov  [place] Department of Health and Human Services: www.womenshealth.gov Summary  Sexually transmitted infections (STIs) can spread through exchanging bodily fluids during sexual contact. Fluids include saliva, semen, blood, vaginal mucus, and urine.  You may have an increased risk for developing an STI if you have unprotected sex. Sex includes oral, vaginal, or anal sex.  If you do have sex, limit your number of sex partners and use barrier protection every time you have sex. This information is not intended to replace advice given to you by your health care provider. Make sure you discuss any questions you have with your health care provider. Document Revised: 09/15/2019 Document Reviewed: 09/15/2019 Elsevier Patient Education  2021 Elsevier Inc.  

## 2020-11-22 NOTE — Progress Notes (Signed)
Sx's of yeast infection. Vaginal itching and white discharge Sx's onset x 2 days

## 2020-11-23 ENCOUNTER — Other Ambulatory Visit (INDEPENDENT_AMBULATORY_CARE_PROVIDER_SITE_OTHER): Payer: Self-pay | Admitting: Primary Care

## 2020-11-23 DIAGNOSIS — A749 Chlamydial infection, unspecified: Secondary | ICD-10-CM

## 2020-11-23 LAB — CERVICOVAGINAL ANCILLARY ONLY
Bacterial Vaginitis (gardnerella): POSITIVE — AB
Candida Glabrata: NEGATIVE
Candida Vaginitis: POSITIVE — AB
Chlamydia: POSITIVE — AB
Comment: NEGATIVE
Comment: NEGATIVE
Comment: NEGATIVE
Comment: NEGATIVE
Comment: NEGATIVE
Comment: NORMAL
Neisseria Gonorrhea: NEGATIVE
Trichomonas: NEGATIVE

## 2020-11-23 MED ORDER — FLUCONAZOLE 150 MG PO TABS: 150.0000 mg | ORAL_TABLET | Freq: Once | ORAL | 0 refills | Status: AC

## 2020-11-23 MED ORDER — METRONIDAZOLE 500 MG PO TABS: 500.0000 mg | ORAL_TABLET | Freq: Two times a day (BID) | ORAL | 0 refills | Status: DC

## 2020-11-23 MED ORDER — AZITHROMYCIN 500 MG PO TABS
1000.0000 mg | ORAL_TABLET | Freq: Once | ORAL | Status: AC
  Administered 2020-11-25: 1000 mg via ORAL

## 2020-11-25 ENCOUNTER — Encounter (INDEPENDENT_AMBULATORY_CARE_PROVIDER_SITE_OTHER): Payer: Self-pay | Admitting: Primary Care

## 2020-11-25 ENCOUNTER — Other Ambulatory Visit: Payer: Self-pay

## 2020-11-25 ENCOUNTER — Ambulatory Visit (INDEPENDENT_AMBULATORY_CARE_PROVIDER_SITE_OTHER): Payer: Medicaid Other | Admitting: Primary Care

## 2020-11-25 VITALS — BP 114/69 | HR 104 | Temp 98.1°F | Ht 67.0 in | Wt 160.0 lb

## 2020-11-25 DIAGNOSIS — A749 Chlamydial infection, unspecified: Secondary | ICD-10-CM

## 2020-11-25 DIAGNOSIS — B379 Candidiasis, unspecified: Secondary | ICD-10-CM | POA: Diagnosis not present

## 2020-11-25 DIAGNOSIS — B9689 Other specified bacterial agents as the cause of diseases classified elsewhere: Secondary | ICD-10-CM

## 2020-11-25 DIAGNOSIS — N760 Acute vaginitis: Secondary | ICD-10-CM | POA: Diagnosis not present

## 2020-11-25 NOTE — Progress Notes (Signed)
[facility]   [id number]  Arrival Time:    [id number] DISCHARGE  SUBJECTIVE:  Stacey Rodriguez is a 25 y.o. female who in in today for treatment for chlamydia and medication has been sent in for bacterial vaginosis and a yeast infection Patient's last menstrual period was 11/01/2020 (lmp unknown). Current birth control method: Compliant with BC:  ROS: As per HPI.  All other pertinent ROS negative.     Past Medical History:  Diagnosis Date  . Genital herpes   . Hx of chlamydia infection 2017  . Hx of trichomoniasis 2017   Past Surgical History:  Procedure Laterality Date  . CESAREAN SECTION N/A 08/21/2019   Procedure: CESAREAN SECTION;  Surgeon: Catalina Antigua, MD;  Location: [facility] LD ORS;  Service: Obstetrics;  Laterality: N/A;  . NO PAST SURGERIES     No Known Allergies Current Outpatient Medications on File Prior to Visit  Medication Sig Dispense Refill  . metroNIDAZOLE (FLAGYL) 500 MG tablet Take 1 tablet (500 mg total) by mouth 2 (two) times daily. 14 tablet 0  . [DISCONTINUED] albuterol (PROVENTIL HFA;VENTOLIN HFA) 108 (90 Base) MCG/ACT inhaler Inhale 1-2 puffs into the lungs every 6 (six) hours as needed for wheezing or shortness of breath. (Patient not taking: Reported on 01/29/2020) 1 Inhaler 11   Current Facility-Administered Medications on File Prior to Visit  Medication Dose Route Frequency Provider Last Rate Last Admin  . azithromycin (ZITHROMAX) tablet 1,000 mg  1,000 mg Oral Once Grayce Sessions, NP         Family History  Problem Relation Age of Onset  . Asthma Mother   . Healthy Father     OBJECTIVE:  Vitals:   11/25/20 1403  BP: 114/69  Pulse: (!) 104  Temp: 98.1 F (36.7 C)  TempSrc: Temporal  SpO2: 94%  Weight: 160 lb (72.6 kg)  Height: 5\' 7"  (1.702 m)     General appearance: Alert, NAD, appears stated age Head: NCAT Throat: lips, mucosa, and tongue normal; teeth and gums normal Lungs: CTA bilaterally without  adventitious breath sounds Heart: regular rate and rhythm.  Radial pulses 2+ symmetrical bilaterally Back: no CVA tenderness Abdomen: soft, non-tender; bowel sounds normal; no masses or organomegaly; no guarding or rebound tenderness GU: denies vulvar lesions or erythema Skin: warm and dry Psychological:  Alert and cooperative. Normal mood and affect.  LABS:  Results for orders placed or performed in visit on 11/22/20  Cervicovaginal ancillary only  Result Value Ref Range   Neisseria Gonorrhea Negative    Chlamydia Positive (A)    Trichomonas Negative    Bacterial Vaginitis (gardnerella) Positive (A)    Candida Vaginitis Positive (A)    Candida Glabrata Negative    Comment      Normal Reference Range Bacterial Vaginosis - Negative   Comment Normal Reference Range Candida Species - Negative    Comment Normal Reference Range Candida Galbrata - Negative    Comment Normal Reference Range Trichomonas - Negative    Comment Normal Reference Ranger Chlamydia - Negative    Comment      Normal Reference Range Neisseria Gonorrhea - Negative     ASSESSMENT & PLAN: Stacey Rodriguez was seen today for treatment for std.  Diagnoses and all orders for this visit:  Chlamydia Given  azithromycin 1g in office  BV (bacterial vaginosis) Prescribed metronidazole 500 mg twice daily for 7 days (do not take while consuming alcohol and/or if breastfeeding)  Candida infection Prescribed diflucan 150 mg once daily and then  second dose 72 hours later  Grayce Sessions

## 2021-01-03 ENCOUNTER — Other Ambulatory Visit: Payer: Self-pay

## 2021-01-03 ENCOUNTER — Encounter (HOSPITAL_COMMUNITY): Payer: Self-pay

## 2021-01-03 ENCOUNTER — Ambulatory Visit (HOSPITAL_COMMUNITY)
Admission: EM | Admit: 2021-01-03 | Discharge: 2021-01-03 | Disposition: A | Discharge status: Home / Self Care | Payer: Medicaid Other | Attending: Family Medicine | Admitting: Family Medicine

## 2021-01-03 DIAGNOSIS — N898 Other specified noninflammatory disorders of vagina: Secondary | ICD-10-CM | POA: Diagnosis not present

## 2021-01-03 MED ORDER — CEFTRIAXONE SODIUM 500 MG IJ SOLR
INTRAMUSCULAR | Status: AC
  Filled 2021-01-03: qty 500

## 2021-01-03 MED ORDER — CEFTRIAXONE SODIUM 500 MG IJ SOLR
500.0000 mg | Freq: Once | INTRAMUSCULAR | Status: AC
  Administered 2021-01-03: 500 mg via INTRAMUSCULAR

## 2021-01-03 MED ORDER — LIDOCAINE HCL (PF) 1 % IJ SOLN
INTRAMUSCULAR | Status: AC
  Filled 2021-01-03: qty 2

## 2021-01-03 MED ORDER — DOXYCYCLINE HYCLATE 100 MG PO CAPS: 100.0000 mg | ORAL_CAPSULE | Freq: Two times a day (BID) | ORAL | 0 refills | Status: DC

## 2021-01-03 NOTE — ED Provider Notes (Signed)
[facility]   [id number] 01/03/21 Arrival Time: 1032  ASSESSMENT & PLAN:  1. Vaginal discharge   2. Vaginal itching       Discharge Instructions     You have been given the following today for treatment of suspected gonorrhea and/or chlamydia:  cefTRIAXone (ROCEPHIN) injection 500 mg  Please pick up your prescription for doxycycline 100 mg and begin taking twice daily for the next seven (7) days.  Even though we have treated you today, we have sent testing for sexually transmitted infections. We will notify you of any positive results once they are received. If required, we will prescribe any medications you might need.  Please refrain from all sexual activity for at least the next seven days.    Without s/s of PID.  Labs Reviewed  CERVICOVAGINAL ANCILLARY ONLY   Will notify of any positive results. Instructed to refrain from sexual activity for at least seven days.  Reviewed expectations re: course of current medical issues. Questions answered. Outlined signs and symptoms indicating need for more acute intervention. Patient verbalized understanding. After Visit Summary given.   SUBJECTIVE:  Stacey Rodriguez is a 25 y.o. female who presents with complaint of vaginal discharge and itching. Worried about STD. Onset gradual. First noticed 2-3 d ago. Describes discharge as thick and opaque; without specific odor. No specific aggravating or alleviating factors reported. Denies: urinary frequency, dysuria and gross hematuria. Afebrile. No abdominal or pelvic pain. Normal PO intake wihout n/v. No genital rashes or lesions. Reports that she is sexually active. OTC treatment: none. History of STI: treated chlamydia.  No LMP recorded. Patient has had an implant.   OBJECTIVE:  Vitals:   01/03/21 1148  BP: 100/75  Pulse: 79  Resp: 16  Temp: 98.3 F (36.8 C)  TempSrc: Oral  SpO2: 99%    General appearance: alert, cooperative, appears stated age and no  distress Lungs: unlabored respirations; speaks full sentences without difficulty Back: no CVA tenderness; FROM at waist Abdomen: soft, non-tender GU: deferred Skin: warm and dry Psychological: alert and cooperative; normal mood and affect.  Results for orders placed or performed in visit on 11/22/20  Cervicovaginal ancillary only  Result Value Ref Range   Neisseria Gonorrhea Negative    Chlamydia Positive (A)    Trichomonas Negative    Bacterial Vaginitis (gardnerella) Positive (A)    Candida Vaginitis Positive (A)    Candida Glabrata Negative    Comment      Normal Reference Range Bacterial Vaginosis - Negative   Comment Normal Reference Range Candida Species - Negative    Comment Normal Reference Range Candida Galbrata - Negative    Comment Normal Reference Range Trichomonas - Negative    Comment Normal Reference Ranger Chlamydia - Negative    Comment      Normal Reference Range Neisseria Gonorrhea - Negative    Labs Reviewed  CERVICOVAGINAL ANCILLARY ONLY    No Known Allergies  Past Medical History:  Diagnosis Date  . Genital herpes   . Hx of chlamydia infection 2017  . Hx of trichomoniasis 2017   Family History  Problem Relation Age of Onset  . Asthma Mother   . Healthy Father    Social History   Socioeconomic History  . Marital status: Single    Spouse name: Not on file  . Number of children: Not on file  . Years of education: Not on file  . Highest education level: Not on file  Occupational History  . Not on file  Tobacco Use  . Smoking status: Never Smoker  . Smokeless tobacco: Never Used  Vaping Use  . Vaping Use: Never used  Substance and Sexual Activity  . Alcohol use: Not Currently    Comment: ocassionally  . 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 Sexual activity: Yes    Birth control/protection: None  Other Topics Concern  . Not on  file  Social History Narrative   ** Merged History Encounter **       Social Determinants of Health   Financial Resource Strain: Not on file  Food Insecurity: Not on file  Transportation Needs: Not on file  Physical Activity: Not on file  Stress: Not on file  Social Connections: Not on file  Intimate Partner Violence: Not on file          Mardella Layman, MD 01/03/21 1239

## 2021-01-03 NOTE — Discharge Instructions (Addendum)

## 2021-01-03 NOTE — ED Triage Notes (Signed)
Pt reports white vaginal discharge and vaginal itchiness x 2-3 days.   Pt requested STD's test.

## 2021-01-04 LAB — CERVICOVAGINAL ANCILLARY ONLY
Bacterial Vaginitis (gardnerella): POSITIVE — AB
Candida Glabrata: NEGATIVE
Candida Vaginitis: POSITIVE — AB
Chlamydia: NEGATIVE
Comment: NEGATIVE
Comment: NEGATIVE
Comment: NEGATIVE
Comment: NEGATIVE
Comment: NEGATIVE
Comment: NORMAL
Neisseria Gonorrhea: NEGATIVE
Trichomonas: NEGATIVE

## 2021-01-06 ENCOUNTER — Telehealth (HOSPITAL_COMMUNITY): Payer: Self-pay | Admitting: Emergency Medicine

## 2021-01-06 MED ORDER — FLUCONAZOLE 150 MG PO TABS: 150.0000 mg | ORAL_TABLET | Freq: Once | ORAL | 0 refills | Status: AC

## 2021-02-18 IMAGING — US US MFM OB DETAIL +14 WK
1 series · 13 of 28 positions shown · non-contrast
Comparison: none

[Series 1: us mfm ob detail +14 wk · 13 of 66 slices shown]
[im 3/66]
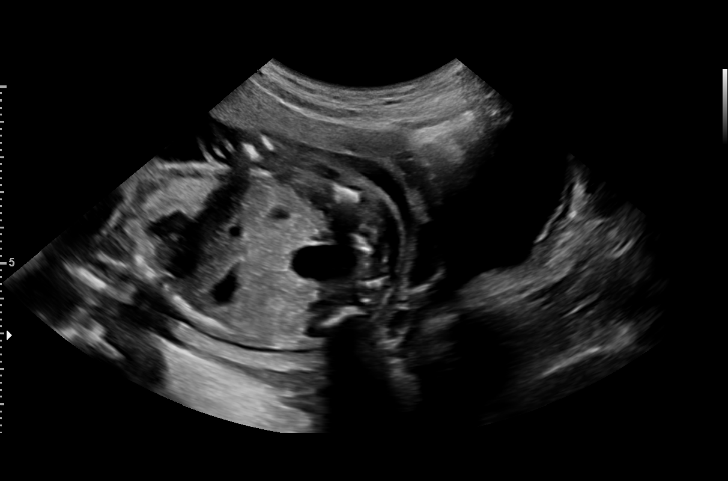
[im 8/66]
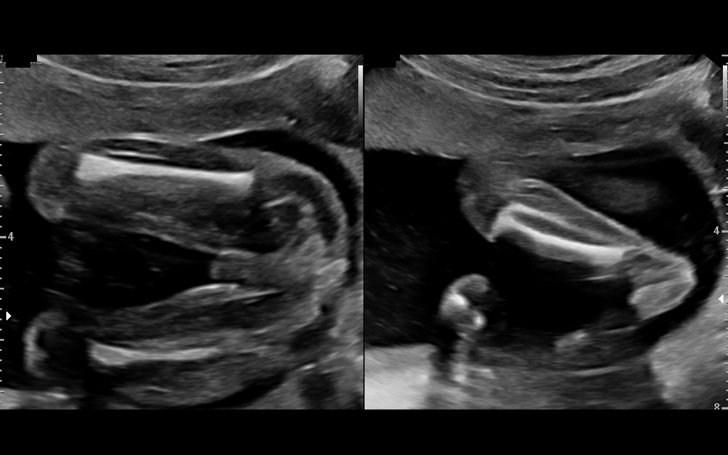
[im 13/66]
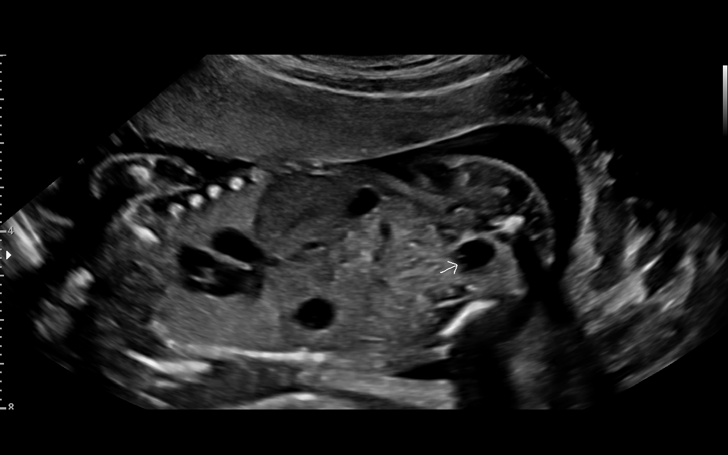
[im 17/66]
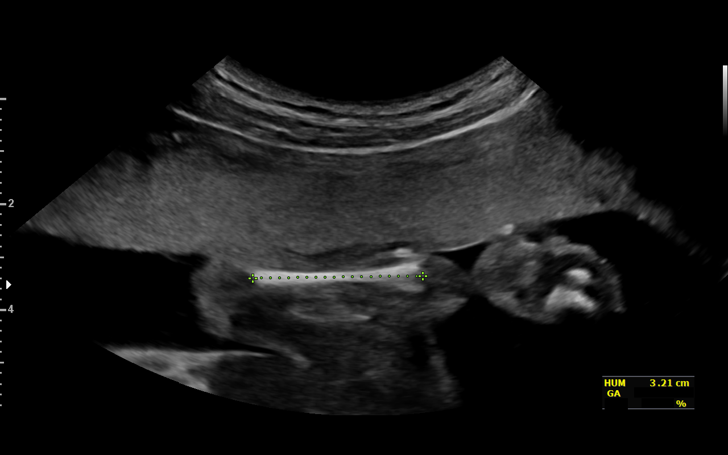
[im 22/66]
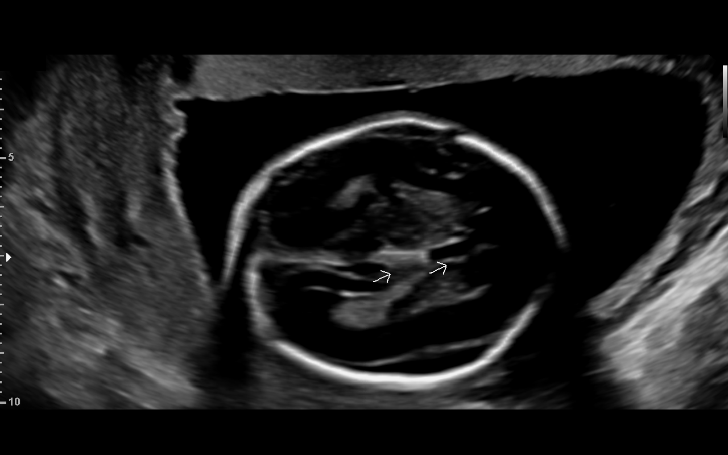
[im 27/66]
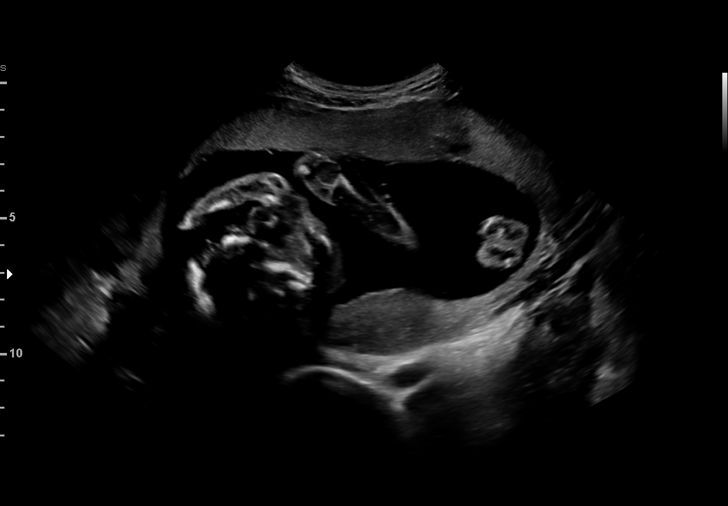
[im 34/66]
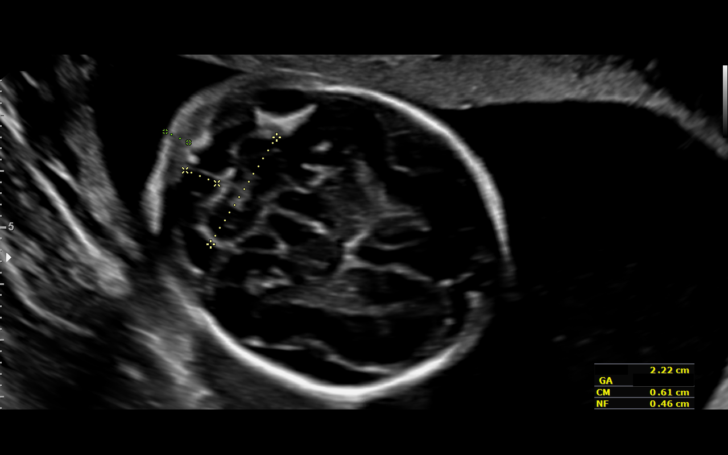
[im 39/66]
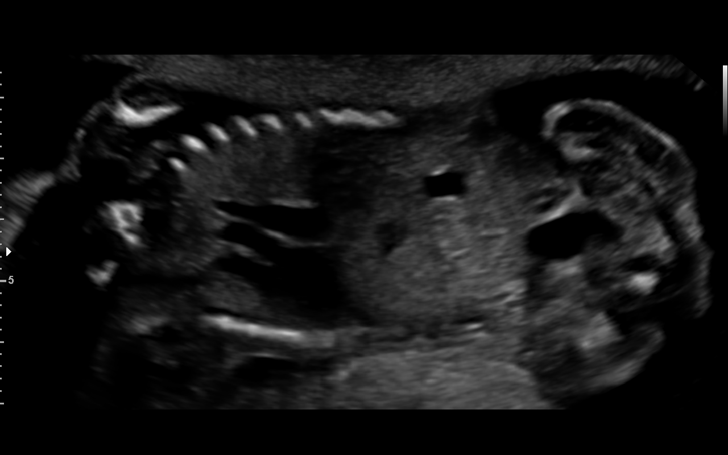
[im 44/66]
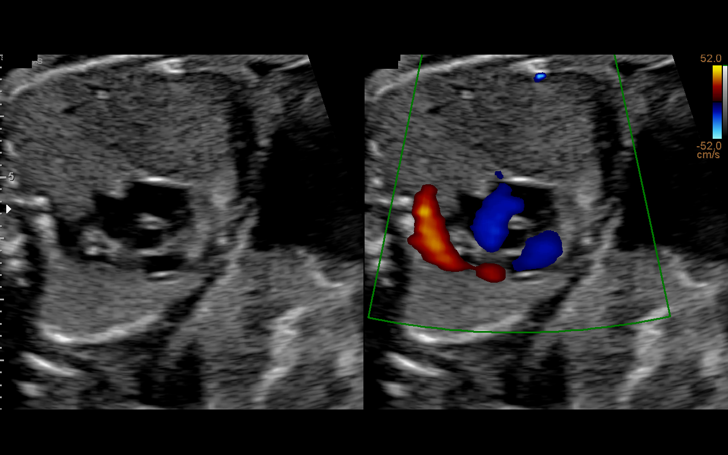
[im 49/66]
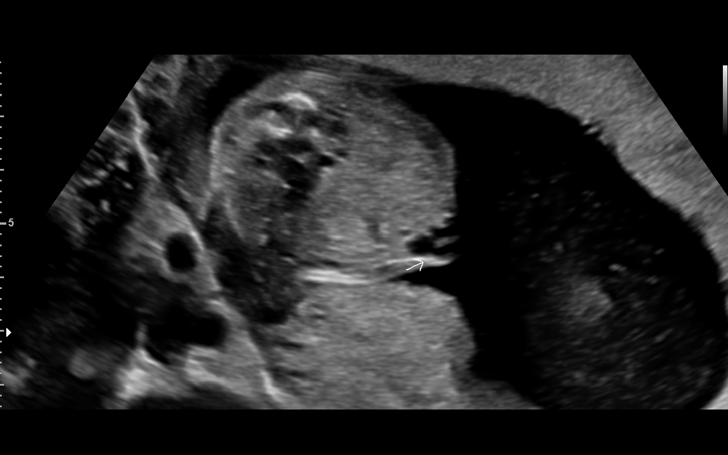
[im 53/66]
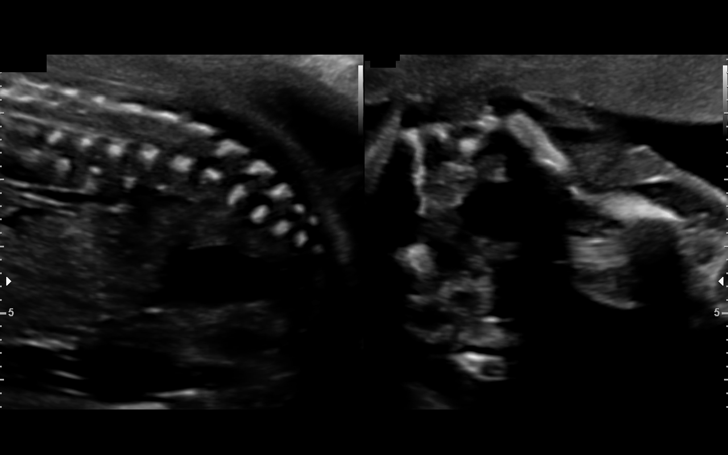
[im 58/66]
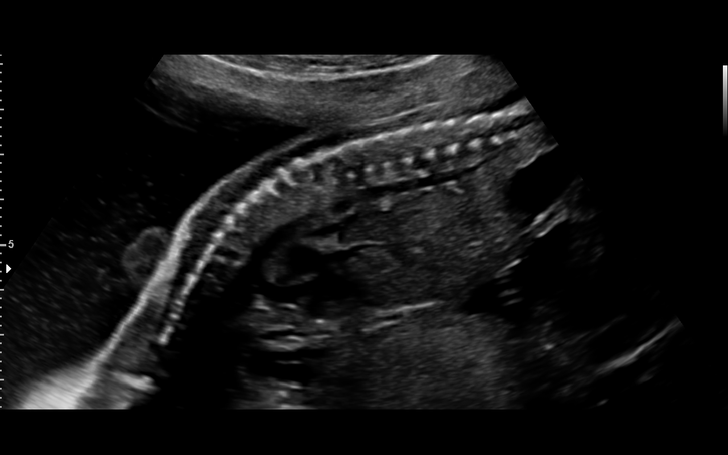
[im 63/66]
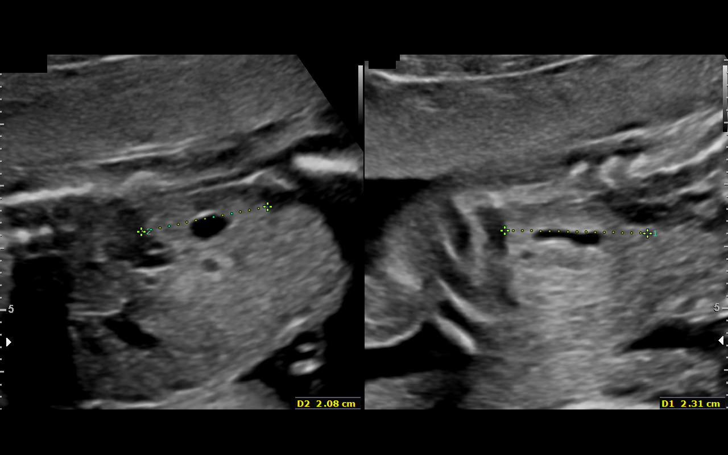

[13 of 28 positions shown; findings below may reference images not displayed]

ERJO NP

 ----------------------------------------------------------------------

 ----------------------------------------------------------------------
Indications

  Encounter for antenatal screening for
  malformations (Low Risk NIPS)
  Genetic carrier (Bekric Verdiu Silent Carrier)
  21 weeks gestation of pregnancy
 ----------------------------------------------------------------------
Fetal Evaluation

 Num Of Fetuses:         1
 Fetal Heart Rate(bpm):  133
 Cardiac Activity:       Observed
 Presentation:           Variable
 Placenta:               Anterior
 P. Cord Insertion:      Visualized

 Amniotic Fluid
 AFI FV:      Within normal limits

                             Largest Pocket(cm)

Biometry

 BPD:      52.8  mm     G. Age:  22w 0d         61  %    CI:        71.15   %    70 - 86
                                                         FL/HC:      17.2   %    18.4 -
 HC:      199.4  mm     G. Age:  22w 1d         56  %    HC/AC:      1.21        1.06 -
 AC:      164.5  mm     G. Age:  21w 4d         35  %    FL/BPD:     65.0   %    71 - 87
 FL:       34.3  mm     G. Age:  20w 6d         14  %    FL/AC:      20.9   %    20 - 24
 HUM:      32.7  mm     G. Age:  21w 0d         31  %
 CER:      22.2  mm     G. Age:  21w 1d         30  %
 NFT:       4.6  mm

 LV:        6.3  mm
 CM:        6.1  mm
 Est. FW:     414  gm    0 lb 15 oz      24  %
OB History

 Gravidity:    1         Term:   0
 Living:       0
Gestational Age

 LMP:           19w 1d        Date:  11/24/18                 EDD:   08/31/19
 U/S Today:     21w 5d                                        EDD:   08/13/19
 Best:          21w 5d     Det. By:  U/S (04/07/19)           EDD:   08/13/19
Anatomy

 Cranium:               Appears normal         Aortic Arch:            Appears normal
 Cavum:                 Appears normal         Ductal Arch:            Appears normal
 Ventricles:            Appears normal         Diaphragm:              Appears normal
 Choroid Plexus:        Appears normal         Stomach:                Appears normal, left
                                                                       sided
 Cerebellum:            Appears normal         Abdomen:                Appears normal
 Posterior Fossa:       Appears normal         Abdominal Wall:         Appears nml (cord
                                                                       insert, abd wall)
 Nuchal Fold:           Appears normal         Cord Vessels:           Appears normal (3
                                                                       vessel cord)
 Face:                  Orbits appear          Kidneys:                Appear normal
                        normal
 Lips:                  Appears normal         Bladder:                Appears normal
 Thoracic:              Appears normal         Spine:                  Limited views
                                                                       appear normal
 Heart:                 Appears normal         Upper Extremities:      Appears normal
                        (4CH, axis, and
                        situs)
 RVOT:                  Not well visualized    Lower Extremities:      Appears normal
 LVOT:                  Not well visualized

 Other:  Male gender. Technically difficult due to fetal position.
Cervix Uterus Adnexa

 Cervix
 Length:           4.19  cm.
 Normal appearance by transabdominal scan.

 Uterus
 No abnormality visualized.

 Left Ovary
 Within normal limits.

 Right Ovary
 Within normal limits.

 Cul De Sac
 No free fluid seen.

 Adnexa
 No abnormality visualized.
Impression

 We performed fetal anatomy scan. No makers of
 aneuploidies or fetal structural defects are seen. Fetal
 biometry is consistent with her previously-established dates.
 Amniotic fluid is normal and good fetal activity is seen.
 Patient understands the limitations of ultrasound in detecting
 fetal anomalies.

 On cell-free fetal DNA screening, the risks of fetal
 aneuploidies are not increased.

 Patient met with our genetic counselor today (alpha
 thalassemia silent carrier). You will be receiving a separate
 letter from her.
Recommendations

 -An appointment was made for her to return in 4 weeks for
 completion of fetal anatomy.
                 [place], [place]

## 2021-05-27 ENCOUNTER — Emergency Department (HOSPITAL_COMMUNITY)
Admission: EM | Admit: 2021-05-27 | Discharge: 2021-05-27 | Disposition: A | Discharge status: Home / Self Care | Payer: Medicaid Other | Attending: Emergency Medicine | Admitting: Emergency Medicine

## 2021-05-27 ENCOUNTER — Other Ambulatory Visit: Payer: Self-pay

## 2021-05-27 DIAGNOSIS — R110 Nausea: Secondary | ICD-10-CM | POA: Diagnosis not present

## 2021-05-27 DIAGNOSIS — R1111 Vomiting without nausea: Secondary | ICD-10-CM | POA: Diagnosis not present

## 2021-05-27 DIAGNOSIS — F1193 Opioid use, unspecified with withdrawal: Secondary | ICD-10-CM | POA: Insufficient documentation

## 2021-05-27 DIAGNOSIS — R112 Nausea with vomiting, unspecified: Secondary | ICD-10-CM | POA: Diagnosis not present

## 2021-05-27 MED ORDER — ONDANSETRON 8 MG PO TBDP: 8.0000 mg | ORAL_TABLET | Freq: Three times a day (TID) | ORAL | 0 refills | Status: DC | PRN

## 2021-05-27 MED ORDER — CLONIDINE HCL 0.1 MG PO TABS
0.1000 mg | ORAL_TABLET | Freq: Once | ORAL | Status: AC
  Administered 2021-05-27: 0.1 mg via ORAL
  Filled 2021-05-27: qty 1

## 2021-05-27 MED ORDER — CLONIDINE HCL 0.1 MG PO TABS: 0.1000 mg | ORAL_TABLET | Freq: Three times a day (TID) | ORAL | 0 refills | Status: DC | PRN

## 2021-05-27 MED ORDER — LOPERAMIDE HCL 2 MG PO CAPS: 2.0000 mg | ORAL_CAPSULE | Freq: Four times a day (QID) | ORAL | 0 refills | Status: DC | PRN

## 2021-05-27 MED ORDER — IBUPROFEN 600 MG PO TABS: 600.0000 mg | ORAL_TABLET | Freq: Four times a day (QID) | ORAL | 0 refills | Status: DC | PRN

## 2021-05-27 MED ORDER — DICYCLOMINE HCL 10 MG PO CAPS
10.0000 mg | ORAL_CAPSULE | Freq: Once | ORAL | Status: AC
  Administered 2021-05-27: 10 mg via ORAL
  Filled 2021-05-27: qty 1

## 2021-05-27 MED ORDER — ONDANSETRON 4 MG PO TBDP
4.0000 mg | ORAL_TABLET | Freq: Once | ORAL | Status: AC
  Administered 2021-05-27: 4 mg via ORAL
  Filled 2021-05-27: qty 1

## 2021-05-27 MED ORDER — NAPROXEN 500 MG PO TABS
500.0000 mg | ORAL_TABLET | Freq: Once | ORAL | Status: AC
  Administered 2021-05-27: 500 mg via ORAL
  Filled 2021-05-27: qty 1

## 2021-05-27 NOTE — ED Triage Notes (Addendum)
Pt BIBA from home-  Per EMS- pt c/o nausea, emesis x 3 days. Pt reports withdrawing from percocet (last taken Tuesday); previously taking for 1 week (3-5x/day/ 10 mg).VSS 130/90; 90 HR; cbg 142;   Pt denies any other complaints other than "withdrawing for percocet"

## 2021-05-27 NOTE — Discharge Instructions (Signed)
Substance Abuse Treatment Programs ° °Intensive Outpatient Programs °[facility]     °[address]      °[place], [place]                   °[phone]      ° °[facility] °[address] °[place], [place] °[phone] ° °[facility]     °(Inpatient and outpatient)     °[address]           °[phone]   ° °[facility] °[phone] (Suboxone and Methadone) ° °[address]      °[place], [place] [address]      °336-882-2125      ° °[address] °[place], [place] °852-3033 ° °[facility] (Outpatient/Inpatient, [facility])    °(insurance only) [phone]      °       °[facility] (Groups & Residential) °[place], [place] °336-389-1413 ° °   °[facility]     °[address]     °[place], [place]      °[phone]      ° °[facility] (for caregivers and family) °[address] °[place], [place] °336-299-4655 ° ° ° ° ° °Residential Treatment Programs °[facility]      °[address], [place], [place] [address]  °([phone]      ° °[facility] °[address] [place], [place] [address] °[phone] ° °[facility]        °336-248-8914      ° °[facility] °1-800-659-3381 ° °[facility]             °[address]                                         °[place], [place]                                                °877-615-2722 or 336-784-9470                              ° °[facility] [place] °[address] °[place], [address] °1.877.941.8954 ° °[facility]    °[address]      °[place], [place]     °336-273-5306      ° °[facility] °[address] °, [place] °336-285-9073 ° °[facility]   °[address]     °[place], [place] [address]     °[phone]      °Admissions: 8am-3pm M-F ° °[facility] °[address] °[place],  [place] °336-227-7417 ° °[facility]: Residential Program (90 Days)   °[place],  [place]      °336-725-8389 or 800-758-6077    ° °[facility] °[place],  °(Walk in Hours over the weekend or by referral) ° °[facility] °[address], [place],  [address] °([phone] ° °Crisis Mobile: [facility]:  1-877-626-1772 (for crisis response 24 hours a day) °[facility] Hotline:      1-800-256-2452 °Outpatient Psychiatry and Counseling ° °[facility]: Mobile Crisis   Management 24 hours:  1-877-626-1772 ° °[facility] sliding scale fee and walk in schedule: M-F 8am-12pm/1pm-3pm °[address]  °[place], [place] [address] °[phone] ° °[facility] °[address] °[place], [place] [address] °336-703-9650 ° °[facility] (Former[facility] known [facility])- new patient walk-in appointments available Monday - Friday 8am -3pm.          °[address] °[place], [place] [address] °336-676-6840 or crisis line- 336-676-6905 ° °[facility] Intensive Outpatient Therapy Program °[address] °[place], [place] [address] °336-832-9804 ° °[facility]                  °Crisis Services      °336.641.4993      °[address]     °[place], [place] [address]                ° °[facility]   °[facility] °[phone] °[address] °[place], [place] [address] ° ° °[facility]          °[address],  °[place], [place] [address]       °([phone] ° °[facility] °[address] °[place], [place] [address] °336-292-1510 ° °[facility]    °[address]    °[place], [place] [address]     °336-632-3505      ° °Parish McKinney, MD     °[address]     °[place] [address]     °336-282-1251     °  °[facility] °[address] °[place] [address] ° °[facility]     °[address]     °[place], [place]      °336-542-2076      ° °[facility] °Stacey Ahluwalia, MD °[address] °[place], [place] [address] °336-420-9558 ° °[facility]     °[address]     °[place], [place] [address]     °336-274-1237      ° °[facility] °[address] °[place], [place] [address] °336-854-4450 °Resources for Temporary Residential Assistance/Crisis Centers ° °DAY CENTERS °[facility] °M-F 8am-3pm   °[address] [place], [place] [address]   336-332-0824 °Services include: laundry, barbering, support groups, case management, phone  & computer access, showers, AA/NA mtgs, mental health/substance abuse nurse, job skills class, disability information, VA assistance, spiritual classes, etc.  ° °HOMELESS SHELTERS ° °[facility]     °[facility]   °[address], [place]     °336.271.5959       °       °[facility] (women and children)       °[address] °[place], [place] [address] °336-275-0820 °[email or web address] for application and process °Application Required ° °[facility]   °[address]    °[place] [address]     °[phone]       °             °[facility] °[address] °[place], [place] [address] °336.273.5572 °336-235-0363(schedule application appt.) °Application Required ° °[facility] (women only)    °[address]     °[place],  [address]     °336-884-1039      °  Intake starts 6pm daily °Need valid ID, SSC, & Police report °[facility] [place] °[address] °[place], [place] °336-881-5420 °Application Required ° °[facility] (men only)     °[address]      °[place], [place]     °[phone]      ° °[facility] °(Pregnant women only) °[address] °[place], [place] °336-275-0206 ° °[facility]      °[address]      °[place], [place] [address]     °336-722-9951      °       °[facility] °[address] °[place], [place] °336-723-1848 °90 day commitment/SA/Application process ° °[facility](men only)     °[address]     °[place], [place]     °[phone]       °Check-in at 7pm     °       °[facility] °[address] °[place], [place] [address] °336-248-6684 °Men/Women/Women and Children must be there by 7 pm ° °[facility] °[place], [place] °336-722-8721                ° °

## 2021-05-27 NOTE — ED Notes (Signed)
D/c paperwork reviewed with pt, including prescriptions. Pt verbalzed understanding, with no questions or concerns at time of d/c. Ambulatory to ED exit to meet ride.

## 2021-05-27 NOTE — ED Provider Notes (Signed)
[facility]-EMERGENCY DEPT Provider Note   CSN: [id number] Arrival date & time: 05/27/21  1016     History Chief Complaint  Patient presents with   Withdrawal    Stacey Rodriguez is a 25 y.o. female.  HPI     25 year old female comes in with chief complaint of withdrawal.  Patient admits to using Percocets intermittently.  She ran out of her Percocet 3 days ago and has been having withdrawal symptoms since then.  She is having nausea, vomiting along with body aches and sweats.  She has had withdrawal symptoms in the past.  No SI, HI.  Past Medical History:  Diagnosis Date   Genital herpes    Hx of chlamydia infection 2017   Hx of trichomoniasis 2017    Patient Active Problem List   Diagnosis Date Noted   [redacted] weeks gestation of pregnancy 08/21/2019   Indication for care in labor and delivery, antepartum 08/20/2019   GBS bacteriuria 07/25/2019   GBS (group B Streptococcus carrier), +RV culture, currently pregnant 07/22/2019   Alpha thalassemia silent carrier 05/15/2019   Sickle cell trait (HCC) 05/15/2019   Genital herpes affecting pregnancy 05/15/2019   Supervision of other normal pregnancy, antepartum 01/27/2019   Substance abuse (HCC) 11/11/2015   Adjustment disorder with mixed anxiety and depressed mood 11/11/2015   ECZEMA 05/27/2008    Past Surgical History:  Procedure Laterality Date   CESAREAN SECTION N/A 08/21/2019   Procedure: CESAREAN SECTION;  Surgeon: Catalina Antigua, MD;  [facility]: [facility] LD [facility];  Service: Obstetrics;  Laterality: N/A;   NO PAST SURGERIES       OB History     Gravida  1   Para  1   Term  1   Preterm      AB      Living  1      SAB      IAB      Ectopic      Multiple  0   Live Births  1           Family History  Problem Relation Age of Onset   Asthma Mother    Healthy Father     Social History   Tobacco Use   Smoking status: Never   Smokeless tobacco: Never  Vaping Use   Vaping  Use: Never used  Substance Use Topics   Alcohol use: Not Currently    Comment: ocassionally   Drug use: Yes    Types: Marijuana    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 Authorizing Provider  cloNIDine (CATAPRES) 0.1 MG tablet Take 1 tablet (0.1 mg total) by mouth 3 (three) times daily as needed (restlessness, anxiety). 05/27/21  Yes Derwood Kaplan, MD  etonogestrel (NEXPLANON) 68 MG IMPL implant 1 each by Subdermal route once.   Yes [provider]  ibuprofen (ADVIL) 600 MG tablet Take 1 tablet (600 mg total) by mouth every 6 (six) hours as needed. 05/27/21  Yes Derwood Kaplan, MD  loperamide (IMODIUM) 2 MG capsule Take 1 capsule (2 mg total) by mouth 4 (four) times daily as needed for diarrhea or loose stools. 05/27/21  Yes Jaquelyn Sakamoto, MD  ondansetron (ZOFRAN ODT) 8 MG disintegrating tablet Take 1 tablet (8 mg total) by mouth every 8 (eight) hours as needed for nausea. 05/27/21  Yes  Derwood Kaplan, MD  doxycycline (VIBRAMYCIN) 100 MG capsule Take 1 capsule (100 mg total) by mouth 2 (two) times daily. Patient not taking: Reported on 05/27/2021 01/03/21   Mardella Layman, MD  albuterol (PROVENTIL HFA;VENTOLIN HFA) 108 (90 Base) MCG/ACT inhaler Inhale 1-2 puffs into the lungs every 6 (six) hours as needed for wheezing or shortness of breath. Patient not taking: Reported on 01/29/2020 12/13/17 05/19/20  Elvina Sidle, MD    Allergies    Patient has no known allergies.  Review of Systems   Review of Systems  Constitutional:  Positive for activity change.  Respiratory:  Negative for shortness of breath.   Cardiovascular:  Negative for chest pain.  Gastrointestinal:  Positive for nausea and vomiting.  Psychiatric/Behavioral:  The patient is nervous/anxious.   All other systems reviewed and are negative.  Physical  Exam Updated Vital Signs BP (!) 121/93 (BP Location: Left Arm)   Pulse 86   Temp 97.7 F (36.5 C) (Oral)   Resp 18   SpO2 96%   Physical Exam Vitals and nursing note reviewed.  Constitutional:      Appearance: She is well-developed.  HENT:     Head: Atraumatic.  Cardiovascular:     Rate and Rhythm: Normal rate.  Pulmonary:     Effort: Pulmonary effort is normal.  Musculoskeletal:     Cervical back: Normal range of motion and neck supple.  Skin:    General: Skin is warm and dry.  Neurological:     Mental Status: She is alert and oriented to person, place, and time.    ED Results / Procedures / Treatments   Labs (all labs ordered are listed, but only abnormal results are displayed) Labs Reviewed - No data to display  EKG None  Radiology No results found.  Procedures Procedures   Medications Ordered in ED Medications  cloNIDine (CATAPRES) tablet 0.1 mg (has no administration in time range)  dicyclomine (BENTYL) capsule 10 mg (has no administration in time range)  ondansetron (ZOFRAN-ODT) disintegrating tablet 4 mg (has no administration in time range)  naproxen (NAPROSYN) tablet 500 mg (has no administration in time range)    ED Course  I have reviewed the triage vital signs and the nursing notes.  Pertinent labs & imaging results that were available during my care of the patient were reviewed by me and considered in my medical decision making (see chart for details).    MDM Rules/Calculators/A&P                           25 year old comes in w/ chief complaint of opiate withdrawals. She appears nontoxic right now.  We will start her on some clonidine along with other supportive medications. No tachycardia, does not appear profoundly dry.  No work-up indicated this time.  No SI, HI.  Patient is amenable to following up with outpatient resources.    Final Clinical Impression(s) / ED Diagnoses Final diagnoses:  Opiate withdrawal (HCC)    Rx / DC  Orders ED Discharge Orders          Ordered    cloNIDine (CATAPRES) 0.1 MG tablet  3 times daily PRN        05/27/21 1137    ondansetron (ZOFRAN ODT) 8 MG disintegrating tablet  Every 8 hours PRN        05/27/21 1137    loperamide (IMODIUM) 2 MG capsule  4 times daily PRN  05/27/21 1137    ibuprofen (ADVIL) 600 MG tablet  Every 6 hours PRN        05/27/21 1137             Derwood Kaplan, MD 05/27/21 1139

## 2021-05-30 ENCOUNTER — Telehealth: Payer: Self-pay

## 2021-05-30 NOTE — Telephone Encounter (Signed)
Transition Care Management Follow-up Telephone Call Date of discharge and from where: 05/27/2021-[facility] How have you been since you were released from the hospital? Patient stated she is doing much better Any questions or concerns? No  Items Reviewed: Did the pt receive and understand the discharge instructions provided? Yes  Medications obtained and verified? Yes  Other? No  Any new allergies since your discharge? No  Dietary orders reviewed? No Do you have support at home? Yes   Home Care and Equipment/Supplies: Were home health services ordered? not applicable If so, what is the name of the agency? N/A  Has the agency set up a time to come to the patient's home? not applicable Were any new equipment or medical supplies ordered?  No What is the name of the medical supply agency? N/A Were you able to get the supplies/equipment? not applicable Do you have any questions related to the use of the equipment or supplies? No  Functional Questionnaire: (I = Independent and D = Dependent) ADLs: I  Bathing/Dressing- I  Meal Prep- I  Eating- I  Maintaining continence- I  Transferring/Ambulation- I  Managing Meds- I  Follow up appointments reviewed:  PCP Hospital f/u appt confirmed? No   Specialist Hospital f/u appt confirmed? No   Are transportation arrangements needed? No  If their condition worsens, is the pt aware to call PCP or go to the Emergency Dept.? Yes Was the patient provided with contact information for the PCP's office or ED? Yes Was to pt encouraged to call back with questions or concerns? Yes

## 2021-05-30 NOTE — Telephone Encounter (Signed)
Transition Care Management Unsuccessful Follow-up Telephone Call  Date of discharge and from where:  05/27/2021-[facility]  Attempts:  1st Attempt  Reason for unsuccessful TCM follow-up call:  Left voice message

## 2021-06-21 ENCOUNTER — Ambulatory Visit (HOSPITAL_COMMUNITY)
Admission: EM | Admit: 2021-06-21 | Discharge: 2021-06-21 | Disposition: A | Discharge status: Home / Self Care | Payer: Medicaid Other | Attending: Emergency Medicine | Admitting: Emergency Medicine

## 2021-06-21 ENCOUNTER — Other Ambulatory Visit: Payer: Self-pay

## 2021-06-21 DIAGNOSIS — N898 Other specified noninflammatory disorders of vagina: Secondary | ICD-10-CM

## 2021-06-21 MED ORDER — METRONIDAZOLE 500 MG PO TABS: 500.0000 mg | ORAL_TABLET | Freq: Two times a day (BID) | ORAL | 0 refills | Status: DC

## 2021-06-21 NOTE — ED Triage Notes (Signed)
Pt reports itching, odor and discharge from vagina. Would like to be checked for yeast infection, BV, and STDs.

## 2021-06-21 NOTE — ED Provider Notes (Signed)
[facility]    CSN: [id number] Arrival date & time: 06/21/21  0815      History   Chief Complaint No chief complaint on file.   HPI Stacey Rodriguez is a 25 y.o. female.   Patient presents with thin Amit Leece discharge, vaginal odor, urinary frequency and vaginal irritation for 3 to 4 days.  Denies urinary urgency, abdominal pain, flank pain, hematuria, new rash or lesions, fever, chills.  History of genital herpes, chlamydia and trichomoniasis.  Sexually active, 1 partner, no condom use.   Past Medical History:  Diagnosis Date   Genital herpes    Hx of chlamydia infection 2017   Hx of trichomoniasis 2017    Patient Active Problem List   Diagnosis Date Noted   [redacted] weeks gestation of pregnancy 08/21/2019   Indication for care in labor and delivery, antepartum 08/20/2019   GBS bacteriuria 07/25/2019   GBS (group B Streptococcus carrier), +RV culture, currently pregnant 07/22/2019   Alpha thalassemia silent carrier 05/15/2019   Sickle cell trait ([place]) 05/15/2019   Genital herpes affecting pregnancy 05/15/2019   Supervision of other normal pregnancy, antepartum 01/27/2019   Substance abuse ([place]) 11/11/2015   Adjustment disorder with mixed anxiety and depressed mood 11/11/2015   ECZEMA 05/27/2008    Past Surgical History:  Procedure Laterality Date   CESAREAN SECTION N/A 08/21/2019   Procedure: CESAREAN SECTION;  Surgeon: Mora Bellman, MD;  [facility];  Service: Obstetrics;  Laterality: N/A;   NO PAST SURGERIES      OB History     Gravida  1   Para  1   Term  1   Preterm      AB      Living  1      SAB      IAB      Ectopic      Multiple  0   Live Births  1            Home Medications    Prior to Admission medications   Medication Sig Start Date End Date Taking? Authorizing Provider  cloNIDine (CATAPRES) 0.1 MG tablet Take 1 tablet (0.1 mg total) by mouth 3 (three) times daily as needed (restlessness, anxiety). 05/27/21    Varney Biles, MD  doxycycline (VIBRAMYCIN) 100 MG capsule Take 1 capsule (100 mg total) by mouth 2 (two) times daily. Patient not taking: Reported on 05/27/2021 01/03/21   Vanessa Kick, MD  etonogestrel (NEXPLANON) 68 MG IMPL implant 1 each by Subdermal route once.    [provider]  ibuprofen (ADVIL) 600 MG tablet Take 1 tablet (600 mg total) by mouth every 6 (six) hours as needed. 05/27/21   Varney Biles, MD  loperamide (IMODIUM) 2 MG capsule Take 1 capsule (2 mg total) by mouth 4 (four) times daily as needed for diarrhea or loose stools. 05/27/21   Varney Biles, MD  metroNIDAZOLE (FLAGYL) 500 MG tablet Take 1 tablet (500 mg total) by mouth 2 (two) times daily. 06/21/21  Yes Andriy Sherk R, NP  ondansetron (ZOFRAN ODT) 8 MG disintegrating tablet Take 1 tablet (8 mg total) by mouth every 8 (eight) hours as needed for nausea. 05/27/21   Varney Biles, MD  albuterol (PROVENTIL HFA;VENTOLIN HFA) 108 (90 Base) MCG/ACT inhaler Inhale 1-2 puffs into the lungs every 6 (six) hours as needed for wheezing or shortness of breath. 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   Review of Systems Review of Systems  Constitutional: Negative.   Respiratory: Negative.    Genitourinary:  Positive for frequency, vaginal discharge and vaginal pain. Negative for decreased urine volume, difficulty urinating, dyspareunia, dysuria, enuresis,  flank pain, genital sores, hematuria, menstrual problem, pelvic pain, urgency and vaginal bleeding.  Skin: Negative.     Physical Exam Triage Vital Signs ED Triage Vitals  Enc Vitals Group     BP 06/21/21 0851 104/70     Pulse Rate 06/21/21 0851 81     Resp 06/21/21 0851 16     Temp 06/21/21 0851 98.7 F (37.1 C)     Temp Source 06/21/21 0851 Oral     SpO2 06/21/21 0851 95 %     Weight --      Height --      Head Circumference --      Peak Flow --      Pain Score 06/21/21 0849 0     Pain Loc --      Pain Edu? --      Excl. in GC? --    No data found.  Updated Vital Signs BP 104/70   Pulse 81   Temp 98.7 F (37.1 C) (Oral)   Resp 16   LMP  (LMP Unknown)   SpO2 95%   Visual Acuity Right Eye Distance:   Left Eye Distance:   Bilateral Distance:    Right Eye Near:   Left Eye Near:    Bilateral Near:     Physical Exam Eyes:     Extraocular Movements: Extraocular movements intact.  Pulmonary:     Effort: Pulmonary effort is normal.  Genitourinary:    Comments: Deferred self collect vaginal swab Skin:    General: Skin is warm and dry.  Neurological:     Mental Status: She is oriented to person, place, and time. Mental status is at baseline.  Psychiatric:        Mood and Affect: Mood normal.        Behavior: Behavior normal.     UC Treatments / Results  Labs (all labs ordered are listed, but only abnormal results are displayed) Labs Reviewed  CERVICOVAGINAL ANCILLARY ONLY    EKG   Radiology No results found.  Procedures Procedures (including critical care time)  Medications Ordered in UC Medications - No data to display  Initial Impression / Assessment and Plan / UC Course  I have reviewed the triage vital signs and the nursing notes.  Pertinent labs & imaging results that were available during my care of the patient were reviewed by me and considered in my medical decision making (see chart for details).  Vaginal discharge  We will treat  prophylactically for bacterial vaginosis based on symptomology while awaiting test results, advised abstinence until labs results, treatment is complete and symptoms have resolved, advised condom use moving forward  1.  Metronidazole  500 mg twice daily for 7 days, advised abstinence from alcohol while on medication 2.  Patient may follow-up for persistent or reoccurring symptoms as needed Final Clinical Impressions(s) / UC Diagnoses   Final diagnoses:  Vaginal discharge     Discharge Instructions      Today you are being treated prophylactically for bacterial vaginosis  Take  metronidazole twice a day for the next 7 days, do not drink any alcohol while using this medication as it will make you very sick  Labs pending 2-3 days, you will be contacted if positive for any sti and treatment will be sent to the pharmacy, you will have to return to the clinic if positive for gonorrhea to receive treatment   Please refrain from having sex until labs results, if positive please refrain from having sex until treatment complete and symptoms resolve   If positive for Chlamydia  gonorrhea or trichomoniasis please notify partner or partners so they may tested as well  Moving forward, it is recommended you use some form of protection against the transmission of sti infections  such as condoms or dental dams with each sexual encounter     ED Prescriptions     Medication Sig Dispense Auth. Provider   metroNIDAZOLE (FLAGYL) 500 MG tablet Take 1 tablet (500 mg total) by mouth 2 (two) times daily. 14 tablet Jdyn Parkerson, Leitha Schuller, NP      PDMP not reviewed this encounter.   Hans [place], [place] 06/21/21 [phone]

## 2021-06-21 NOTE — Discharge Instructions (Addendum)
Today you are being treated prophylactically for bacterial vaginosis  Take  metronidazole twice a day for the next 7 days, do not drink any alcohol while using this medication as it will make you very sick  Labs pending 2-3 days, you will be contacted if positive for any sti and treatment will be sent to the pharmacy, you will have to return to the clinic if positive for gonorrhea to receive treatment   Please refrain from having sex until labs results, if positive please refrain from having sex until treatment complete and symptoms resolve   If positive for Chlamydia  gonorrhea or trichomoniasis please notify partner or partners so they may tested as well  Moving forward, it is recommended you use some form of protection against the transmission of sti infections  such as condoms or dental dams with each sexual encounter

## 2021-06-23 LAB — CERVICOVAGINAL ANCILLARY ONLY
Bacterial Vaginitis (gardnerella): POSITIVE — AB
Candida Glabrata: NEGATIVE
Candida Vaginitis: NEGATIVE
Chlamydia: NEGATIVE
Comment: NEGATIVE
Comment: NEGATIVE
Comment: NEGATIVE
Comment: NEGATIVE
Comment: NEGATIVE
Comment: NORMAL
Neisseria Gonorrhea: NEGATIVE
Trichomonas: NEGATIVE

## 2021-08-30 ENCOUNTER — Encounter (HOSPITAL_COMMUNITY): Payer: Self-pay

## 2021-08-30 ENCOUNTER — Ambulatory Visit (HOSPITAL_COMMUNITY)
Admission: EM | Admit: 2021-08-30 | Discharge: 2021-08-30 | Disposition: A | Discharge status: Home / Self Care | Payer: Medicaid Other | Attending: Urgent Care | Admitting: Urgent Care

## 2021-08-30 ENCOUNTER — Other Ambulatory Visit: Payer: Self-pay

## 2021-08-30 DIAGNOSIS — N3001 Acute cystitis with hematuria: Secondary | ICD-10-CM

## 2021-08-30 DIAGNOSIS — R350 Frequency of micturition: Secondary | ICD-10-CM

## 2021-08-30 LAB — POCT URINALYSIS DIPSTICK, ED / UC
Bilirubin Urine: NEGATIVE
Glucose, UA: NEGATIVE mg/dL
Ketones, ur: NEGATIVE mg/dL
Nitrite: NEGATIVE
Protein, ur: NEGATIVE mg/dL
Specific Gravity, Urine: 1.02 (ref 1.005–1.030)
Urobilinogen, UA: 0.2 mg/dL (ref 0.0–1.0)
pH: 6 (ref 5.0–8.0)

## 2021-08-30 MED ORDER — CEPHALEXIN 500 MG PO CAPS: 500.0000 mg | ORAL_CAPSULE | Freq: Two times a day (BID) | ORAL | 0 refills | Status: DC

## 2021-08-30 NOTE — ED Triage Notes (Signed)
Pt presents with urinary frequency/urgency x1 week

## 2021-08-30 NOTE — Discharge Instructions (Signed)

## 2021-08-30 NOTE — ED Provider Notes (Signed)
[place]   MRN: [id number] DOB: 11-02-1995  Subjective:   Stacey Rodriguez is a 26 y.o. female presenting for 1 week history of urinary frequency, urinary urgency, pelvic/bladder pressure. No fever, n/v, abdominal or flank pain, vaginal discharge, hematuria. Has a remote history of UTI. Does not hydrate well with water. Also drinks juice and soda.  No concern for an STI, declined testing.  No concern for pregnancy as she has Nexplanon and has not had cycles.  No current facility-administered medications for this encounter.  Current Outpatient Medications:    cloNIDine (CATAPRES) 0.1 MG tablet, Take 1 tablet (0.1 mg total) by mouth 3 (three) times daily as needed (restlessness, anxiety). (Patient not taking: Reported on 08/30/2021), Disp: 9 tablet, Rfl: 0   doxycycline (VIBRAMYCIN) 100 MG capsule, Take 1 capsule (100 mg total) by mouth 2 (two) times daily. (Patient not taking: Reported on 05/27/2021), Disp: 14 capsule, Rfl: 0   etonogestrel (NEXPLANON) 68 MG IMPL implant, 1 each by Subdermal route once., Disp: , Rfl:    ibuprofen (ADVIL) 600 MG tablet, Take 1 tablet (600 mg total) by mouth every 6 (six) hours as needed., Disp: 20 tablet, Rfl: 0   loperamide (IMODIUM) 2 MG capsule, Take 1 capsule (2 mg total) by mouth 4 (four) times daily as needed for diarrhea or loose stools., Disp: 12 capsule, Rfl: 0   metroNIDAZOLE (FLAGYL) 500 MG tablet, Take 1 tablet (500 mg total) by mouth 2 (two) times daily. (Patient not taking: Reported on 08/30/2021), Disp: 14 tablet, Rfl: 0   ondansetron (ZOFRAN ODT) 8 MG disintegrating tablet, Take 1 tablet (8 mg total) by mouth every 8 (eight) hours as needed for nausea., Disp: 20 tablet, Rfl: 0   No Known Allergies  Past Medical History:  Diagnosis Date   Genital herpes    Hx of chlamydia infection 2017   Hx of trichomoniasis 2017     Past Surgical History:  Procedure Laterality Date   CESAREAN SECTION N/A 08/21/2019   Procedure:  CESAREAN SECTION;  Surgeon: Mora Bellman, MD;  Location: [facility];  Service: Obstetrics;  Laterality: N/A;   NO PAST SURGERIES      Family History  Problem Relation Age of Onset   Asthma Mother    Healthy Father     Social History   Tobacco Use   Smoking status: Never   Smokeless tobacco: Never  Vaping Use   Vaping Use: Never used  Substance Use Topics   Alcohol use: Not Currently    Comment: ocassionally   Drug use: Yes    Types: Marijuana    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    Appearance: Normal appearance. She is well-developed. She is not ill-appearing, toxic-appearing or diaphoretic.  HENT:     Head: Normocephalic and atraumatic.     Nose: Nose normal.     Mouth/Throat:     Mouth: Mucous membranes are moist.     Pharynx: Oropharynx is clear.  Eyes:     General: No scleral icterus.       Right eye: No discharge.  Left eye: No discharge.     Extraocular Movements: Extraocular movements intact.     Conjunctiva/sclera: Conjunctivae normal.     Pupils: Pupils are equal, round, and reactive to light.  Cardiovascular:     Rate and Rhythm: Normal rate.  Pulmonary:     Effort: Pulmonary effort is normal.  Abdominal:     General: Bowel sounds are normal. There is no distension.     Palpations: Abdomen is soft. There is no mass.     Tenderness: There is no abdominal tenderness. There is no right CVA tenderness, left CVA tenderness, guarding or rebound.  Skin:    General: Skin is warm and dry.  Neurological:     General: No focal deficit present.     Mental Status: She is alert and oriented to person, place, and time.  Psychiatric:        Mood and Affect: Mood normal.         Behavior: Behavior normal.        Thought Content: Thought content normal.        Judgment: Judgment normal.    Results for orders placed or performed during the hospital encounter of 08/30/21 (from the past 24 hour(s))  POC Urinalysis dipstick     Status: Abnormal   Collection Time: 08/30/21  8:38 AM  Result Value Ref Range   Glucose, UA NEGATIVE NEGATIVE mg/dL   Bilirubin Urine NEGATIVE NEGATIVE   Ketones, ur NEGATIVE NEGATIVE mg/dL   Specific Gravity, Urine 1.020 1.005 - 1.030   Hgb urine dipstick TRACE (A) NEGATIVE   pH 6.0 5.0 - 8.0   Protein, ur NEGATIVE NEGATIVE mg/dL   Urobilinogen, UA 0.2 0.0 - 1.0 mg/dL   Nitrite NEGATIVE NEGATIVE   Leukocytes,Ua MODERATE (A) NEGATIVE    Assessment and Plan :   PDMP not reviewed this encounter.  1. Acute cystitis with hematuria   2. Urinary frequency    Patient declined STI testing and pregnancy test. Start Keflex to cover for acute cystitis, urine culture pending.  Recommended aggressive hydration, limiting urinary irritants. Counseled patient on potential for adverse effects with medications prescribed/recommended today, ER and return-to-clinic precautions discussed, patient verbalized understanding.    Jaynee Eagles, [place] 08/30/21 [phone]

## 2021-09-26 ENCOUNTER — Emergency Department (HOSPITAL_COMMUNITY)
Admission: EM | Admit: 2021-09-26 | Discharge: 2021-09-26 | Discharge status: Against Medical Advice | Payer: Medicaid Other | Attending: Emergency Medicine | Admitting: Emergency Medicine

## 2021-09-26 ENCOUNTER — Other Ambulatory Visit: Payer: Self-pay

## 2021-09-26 ENCOUNTER — Encounter (HOSPITAL_COMMUNITY): Payer: Self-pay

## 2021-09-26 DIAGNOSIS — Z5321 Procedure and treatment not carried out due to patient leaving prior to being seen by health care provider: Secondary | ICD-10-CM | POA: Diagnosis not present

## 2021-09-26 DIAGNOSIS — R1110 Vomiting, unspecified: Secondary | ICD-10-CM | POA: Insufficient documentation

## 2021-09-26 DIAGNOSIS — F1113 Opioid abuse with withdrawal: Secondary | ICD-10-CM | POA: Diagnosis present

## 2021-09-26 DIAGNOSIS — R52 Pain, unspecified: Secondary | ICD-10-CM | POA: Diagnosis not present

## 2021-09-26 MED ORDER — SODIUM CHLORIDE 0.9 % IV BOLUS: 1000.0000 mL | Freq: Once | INTRAVENOUS | Status: DC

## 2021-09-26 NOTE — ED Triage Notes (Addendum)
Per EMS- patient reports that  she is withdrawing from opiates and had her last Percocet 3 days ago. Patient c/o N/V and anxiety. Patient also c/o abdominal pain.   Patient states, "I do not have to be seen. I just want some medicine sent to [facility],. I have done this before . I know what I need."

## 2021-09-26 NOTE — ED Provider Triage Note (Signed)
Emergency Medicine Provider Triage Evaluation Note  Stacey Rodriguez , a 26 y.o. female  was evaluated in triage.  Pt complains of opiate withdrawal.  Patient states she stopped taking her Percocet 3 days ago.  She has been having intractable vomiting and diffuse pain.  No fevers or chills.  No chest pain or shortness of breath.  Review of Systems  Positive:  Negative: See above   Physical Exam  BP 107/87 (BP Location: Left Arm)    Pulse 77    Temp 97.8 F (36.6 C) (Oral)    Resp 20    Ht 5\' 8"  (1.727 m)    Wt 63.5 kg    SpO2 94%    BMI 21.29 kg/m  Gen:   Awake, no distress   Resp:  Normal effort  MSK:   Moves extremities without difficulty  Other:    Medical Decision Making  Medically screening exam initiated at 3:33 PM.  Appropriate orders placed.  Stacey Rodriguez was informed that the remainder of the evaluation will be completed by another provider, this initial triage assessment does not replace that evaluation, and the importance of remaining in the ED until their evaluation is complete.  Patient demanded medication sent to her pharmacy so she could leave.  I instructed her that there were roughly 19-20 patients ahead of her.  Vital signs are normal.  Patient does appear uncomfortable but she is in no acute distress.  Patient states that she was leaving.   02-06-2003 [place], [place] 09/26/21 1535

## 2021-09-27 ENCOUNTER — Encounter (HOSPITAL_COMMUNITY): Payer: Self-pay | Admitting: Emergency Medicine

## 2021-09-27 ENCOUNTER — Emergency Department (HOSPITAL_COMMUNITY)
Admission: EM | Admit: 2021-09-27 | Discharge: 2021-09-27 | Disposition: A | Discharge status: Home / Self Care | Payer: Medicaid Other | Attending: Emergency Medicine | Admitting: Emergency Medicine

## 2021-09-27 DIAGNOSIS — R5383 Other fatigue: Secondary | ICD-10-CM | POA: Insufficient documentation

## 2021-09-27 DIAGNOSIS — F1910 Other psychoactive substance abuse, uncomplicated: Secondary | ICD-10-CM | POA: Insufficient documentation

## 2021-09-27 DIAGNOSIS — R110 Nausea: Secondary | ICD-10-CM

## 2021-09-27 DIAGNOSIS — M545 Low back pain, unspecified: Secondary | ICD-10-CM | POA: Insufficient documentation

## 2021-09-27 LAB — CBC WITH DIFFERENTIAL/PLATELET
Abs Immature Granulocytes: 0.01 10*3/uL (ref 0.00–0.07)
Basophils Absolute: 0 10*3/uL (ref 0.0–0.1)
Basophils Relative: 1 %
Eosinophils Absolute: 0 10*3/uL (ref 0.0–0.5)
Eosinophils Relative: 0 %
HCT: 47.7 % — ABNORMAL HIGH (ref 36.0–46.0)
Hemoglobin: 17.1 g/dL — ABNORMAL HIGH (ref 12.0–15.0)
Immature Granulocytes: 0 %
Lymphocytes Relative: 28 %
Lymphs Abs: 2.1 10*3/uL (ref 0.7–4.0)
MCH: 29.8 pg (ref 26.0–34.0)
MCHC: 35.8 g/dL (ref 30.0–36.0)
MCV: 83.2 fL (ref 80.0–100.0)
Monocytes Absolute: 1.1 10*3/uL — ABNORMAL HIGH (ref 0.1–1.0)
Monocytes Relative: 15 %
Neutro Abs: 4.4 10*3/uL (ref 1.7–7.7)
Neutrophils Relative %: 56 %
Platelets: 262 10*3/uL (ref 150–400)
RBC: 5.73 MIL/uL — ABNORMAL HIGH (ref 3.87–5.11)
RDW: 12.5 % (ref 11.5–15.5)
WBC: 7.6 10*3/uL (ref 4.0–10.5)
nRBC: 0 % (ref 0.0–0.2)

## 2021-09-27 LAB — URINALYSIS, ROUTINE W REFLEX MICROSCOPIC
Bilirubin Urine: NEGATIVE
Glucose, UA: NEGATIVE mg/dL
Hgb urine dipstick: NEGATIVE
Ketones, ur: NEGATIVE mg/dL
Leukocytes,Ua: NEGATIVE
Nitrite: NEGATIVE
Protein, ur: 100 mg/dL — AB
Specific Gravity, Urine: 1.021 (ref 1.005–1.030)
pH: 5 (ref 5.0–8.0)

## 2021-09-27 LAB — COMPREHENSIVE METABOLIC PANEL
ALT: 17 U/L (ref 0–44)
AST: 22 U/L (ref 15–41)
Albumin: 5.6 g/dL — ABNORMAL HIGH (ref 3.5–5.0)
Alkaline Phosphatase: 60 U/L (ref 38–126)
Anion gap: 15 (ref 5–15)
BUN: 28 mg/dL — ABNORMAL HIGH (ref 6–20)
CO2: 22 mmol/L (ref 22–32)
Calcium: 10.8 mg/dL — ABNORMAL HIGH (ref 8.9–10.3)
Chloride: 93 mmol/L — ABNORMAL LOW (ref 98–111)
Creatinine, Ser: 1.27 mg/dL — ABNORMAL HIGH (ref 0.44–1.00)
GFR, Estimated: 60 mL/min — ABNORMAL LOW (ref >60)
Glucose, Bld: 118 mg/dL — ABNORMAL HIGH (ref 70–99)
Potassium: 3.2 mmol/L — ABNORMAL LOW (ref 3.5–5.1)
Sodium: 130 mmol/L — ABNORMAL LOW (ref 135–145)
Total Bilirubin: 1.6 mg/dL — ABNORMAL HIGH (ref 0.3–1.2)
Total Protein: 9.4 g/dL — ABNORMAL HIGH (ref 6.5–8.1)

## 2021-09-27 LAB — I-STAT BETA HCG BLOOD, ED (MC, WL, AP ONLY): I-stat hCG, quantitative: <5 m[IU]/mL (ref <5)

## 2021-09-27 MED ORDER — ONDANSETRON HCL 4 MG/2ML IJ SOLN
4.0000 mg | Freq: Once | INTRAMUSCULAR | Status: AC
  Administered 2021-09-27: 4 mg via INTRAVENOUS
  Filled 2021-09-27: qty 2

## 2021-09-27 MED ORDER — SODIUM CHLORIDE 0.9 % IV BOLUS
500.0000 mL | Freq: Once | INTRAVENOUS | Status: AC
  Administered 2021-09-27: 500 mL via INTRAVENOUS

## 2021-09-27 MED ORDER — KETOROLAC TROMETHAMINE 15 MG/ML IJ SOLN
15.0000 mg | Freq: Once | INTRAMUSCULAR | Status: AC
  Administered 2021-09-27: 15 mg via INTRAVENOUS
  Filled 2021-09-27: qty 1

## 2021-09-27 MED ORDER — ONDANSETRON 8 MG PO TBDP: 8.0000 mg | ORAL_TABLET | Freq: Three times a day (TID) | ORAL | 0 refills | Status: DC | PRN

## 2021-09-27 NOTE — Discharge Instructions (Signed)
Please follow-up with your primary care doctor.  Additionally would strongly recommend looking into rehab program for substance abuse.  Can use the prescribed nausea medicine as needed.  If you develop uncontrolled vomiting, pain or other new concerning symptom, come back to ER for reassessment.

## 2021-09-27 NOTE — ED Triage Notes (Signed)
Patient BIB [facility] from home with complaint of opiate withdrawal, patient states she took two percocets in the morning and two percocets in the evening every day since September 2022, but stopped four days ago and states she feels like she is withdrawing from percocet due to her back pain. Patient alert, oriented, and in no apparent distress at this time.

## 2021-09-27 NOTE — ED Provider Notes (Signed)
[facility] DEPART[facility] Provider Note   CSN: [id number] Arrival date & time: 09/27/21  1224     History  Chief Complaint  Patient presents with   Back Pain    Stacey Rodriguez is a 26 y.o. female.  Presented to the emergency room with concern for opiate withdrawal.  Patient reports that she has been taking Percocets on a regular basis normally 10 mg dose.  She denies taking any excessive quantity in a short period of time.  Reports that she quit a few days ago and is interested in quitting.  She states that she suffers from chronic back pain, primarily low back pain however ever since stopping her Percocet her pain has been a lot worse, moderate to severe, worse with movement improved with rest.  She denies any history of IV drug use.  No chills or fevers.  Has had some associated nausea but no vomiting.  Denies trauma, no recent surgery, no bladder or bowel incontinence.  HPI     Home Medications Prior to Admission medications   Medication Sig Start Date End Date Taking? Authorizing Provider  cephALEXin (KEFLEX) 500 MG capsule Take 1 capsule (500 mg total) by mouth 2 (two) times daily. 08/30/21   Jaynee Eagles, PA-C  cloNIDine (CATAPRES) 0.1 MG tablet Take 1 tablet (0.1 mg total) by mouth 3 (three) times daily as needed (restlessness, anxiety). Patient not taking: Reported on 08/30/2021 05/27/21   Varney Biles, MD  doxycycline (VIBRAMYCIN) 100 MG capsule Take 1 capsule (100 mg total) by mouth 2 (two) times daily. Patient not taking: Reported on 05/27/2021 01/03/21   Vanessa Kick, MD  etonogestrel (NEXPLANON) 68 MG IMPL implant 1 each by Subdermal route once.    [provider]  ibuprofen (ADVIL) 600 MG tablet Take 1 tablet (600 mg total) by mouth every 6 (six) hours as needed. 05/27/21   Varney Biles, MD  loperamide (IMODIUM) 2 MG capsule Take 1 capsule (2 mg total) by mouth 4 (four) times daily as needed for diarrhea or loose stools. 05/27/21    Varney Biles, MD  metroNIDAZOLE (FLAGYL) 500 MG tablet Take 1 tablet (500 mg total) by mouth 2 (two) times daily. Patient not taking: Reported on 08/30/2021 06/21/21   Hans Eden, NP  ondansetron (ZOFRAN ODT) 8 MG disintegrating tablet Take 1 tablet (8 mg total) by mouth every 8 (eight) hours as needed for nausea. 09/27/21   Lucrezia Starch, MD  albuterol (PROVENTIL HFA;VENTOLIN HFA) 108 (90 Base) MCG/ACT inhaler Inhale 1-2 puffs into the lungs every 6 (six) hours as needed for wheezing or shortness of breath. Patient not taking: Reported on 01/29/2020 12/13/17 05/19/20  Robyn Haber, MD      Allergies    Patient has no known allergies.    Review of Systems   Review of Systems  Constitutional:  Positive for fatigue. Negative for chills and fever.  HENT:  Negative for ear pain and sore throat.   Eyes:  Negative for pain and visual disturbance.  Respiratory:  Negative for cough and shortness of breath.   Cardiovascular:  Negative for chest pain and palpitations.  Gastrointestinal:  Positive for nausea. Negative for abdominal pain and vomiting.  Genitourinary:  Negative for dysuria and hematuria.  Musculoskeletal:  Positive for back pain. Negative for arthralgias.  Skin:  Negative for color change and rash.  Neurological:  Negative for seizures and syncope.  All other systems reviewed and are negative.  Physical Exam Updated Vital Signs BP (!) 137/96 (  BP Location: Right Arm)    Pulse 99    Temp 98.3 F (36.8 C) (Oral)    Resp 16    SpO2 95%  Physical Exam Vitals and nursing note reviewed.  Constitutional:      General: She is not in acute distress.    Appearance: She is well-developed.  HENT:     Head: Normocephalic and atraumatic.  Eyes:     Conjunctiva/sclera: Conjunctivae normal.  Cardiovascular:     Rate and Rhythm: Normal rate and regular rhythm.     Heart sounds: No murmur heard. Pulmonary:     Effort: Pulmonary effort is normal. No respiratory distress.      Breath sounds: Normal breath sounds.  Abdominal:     Palpations: Abdomen is soft.     Tenderness: There is no abdominal tenderness.  Musculoskeletal:        General: No swelling.     Cervical back: Neck supple.     Comments: No midline CT or L-spine tenderness, no step-off or deformity, no swelling noted  Skin:    General: Skin is warm and dry.     Capillary Refill: Capillary refill takes less than 2 seconds.  Neurological:     Mental Status: She is alert.  Psychiatric:        Mood and Affect: Mood normal.    ED Results / Procedures / Treatments   Labs (all labs ordered are listed, but only abnormal results are displayed) Labs Reviewed  CBC WITH DIFFERENTIAL/PLATELET - Abnormal; Notable for the following components:      Result Value   RBC 5.73 (*)    Hemoglobin 17.1 (*)    HCT 47.7 (*)    Monocytes Absolute 1.1 (*)    All other components within normal limits  COMPREHENSIVE METABOLIC PANEL - Abnormal; Notable for the following components:   Sodium 130 (*)    Potassium 3.2 (*)    Chloride 93 (*)    Glucose, Bld 118 (*)    BUN 28 (*)    Creatinine, Ser 1.27 (*)    Calcium 10.8 (*)    Total Protein 9.4 (*)    Albumin 5.6 (*)    Total Bilirubin 1.6 (*)    GFR, Estimated 60 (*)    All other components within normal limits  URINALYSIS, ROUTINE W REFLEX MICROSCOPIC - Abnormal; Notable for the following components:   APPearance HAZY (*)    Protein, ur 100 (*)    Bacteria, UA MANY (*)    All other components within normal limits  I-STAT BETA HCG BLOOD, ED ([facility], [facility], [facility] ONLY)    EKG None  Radiology No results found.  Procedures Procedures    Medications Ordered in ED Medications  ketorolac (TORADOL) 15 MG/ML injection 15 mg (15 mg Intravenous Given 09/27/21 1324)  ondansetron (ZOFRAN) injection 4 mg (4 mg Intravenous Given 09/27/21 1325)  sodium chloride 0.9 % bolus 500 mL (500 mLs Intravenous New Bag/Given 09/27/21 1323)    ED Course/ Medical Decision Making/  A&P                           Medical Decision Making Amount and/or Complexity of Data Reviewed Labs: ordered.  Risk Prescription drug management.   26 year old lady presenting to ER with concern for acute on chronic back pain as well as nausea and concern for possible opiate withdrawal.  Patient initially appeared uncomfortable but was not in distress and had normal vital signs  except for some mild tachycardia.  Basic lab work was obtained and reviewed, mild low sodium, slight elevation in creatinine though not significant enough to constitute acute kidney injury, similar to prior.  Suspect symptoms are are ultimately most likely related to opiate withdrawal.  She was provided symptomatic control with pain (not opioid medication) and nausea control.  Additionally provided IV fluids.  On reassessment vital signs have improved and her symptoms have all completely resolved.  Given she has no ongoing symptoms at this time, feel she can be discharged and managed in the outpatient setting.  Will provide resources for outpatient rehab and counseling.  Counseled on importance of cessation from abusing substances in the future.    After the discussed management above, the patient was determined to be safe for discharge.  The patient was in agreement with this plan and all questions regarding their care were answered.  ED return precautions were discussed and the patient will return to the ED with any significant worsening of condition.         Final Clinical Impression(s) / ED Diagnoses Final diagnoses:  Substance abuse ([place])  Nausea    Rx / DC Orders ED Discharge Orders          Ordered    ondansetron (ZOFRAN ODT) 8 MG disintegrating tablet  Every 8 hours PRN        09/27/21 1420              Lucrezia Starch, MD 09/27/21 1420

## 2021-10-19 ENCOUNTER — Ambulatory Visit (HOSPITAL_COMMUNITY)
Admission: EM | Admit: 2021-10-19 | Discharge: 2021-10-19 | Disposition: A | Discharge status: Home / Self Care | Payer: Medicaid Other | Attending: Family Medicine | Admitting: Family Medicine

## 2021-10-19 ENCOUNTER — Other Ambulatory Visit: Payer: Self-pay

## 2021-10-19 ENCOUNTER — Encounter (HOSPITAL_COMMUNITY): Payer: Self-pay | Admitting: Emergency Medicine

## 2021-10-19 DIAGNOSIS — Z113 Encounter for screening for infections with a predominantly sexual mode of transmission: Secondary | ICD-10-CM

## 2021-10-19 DIAGNOSIS — N760 Acute vaginitis: Secondary | ICD-10-CM | POA: Diagnosis not present

## 2021-10-19 DIAGNOSIS — N898 Other specified noninflammatory disorders of vagina: Secondary | ICD-10-CM

## 2021-10-19 DIAGNOSIS — R300 Dysuria: Secondary | ICD-10-CM | POA: Diagnosis present

## 2021-10-19 DIAGNOSIS — B9689 Other specified bacterial agents as the cause of diseases classified elsewhere: Secondary | ICD-10-CM

## 2021-10-19 DIAGNOSIS — A6000 Herpesviral infection of urogenital system, unspecified: Secondary | ICD-10-CM | POA: Diagnosis not present

## 2021-10-19 LAB — POCT URINALYSIS DIPSTICK, ED / UC
Bilirubin Urine: NEGATIVE
Glucose, UA: NEGATIVE mg/dL
Hgb urine dipstick: NEGATIVE
Ketones, ur: NEGATIVE mg/dL
Leukocytes,Ua: NEGATIVE
Nitrite: NEGATIVE
Protein, ur: NEGATIVE mg/dL
Specific Gravity, Urine: 1.02 (ref 1.005–1.030)
Urobilinogen, UA: 0.2 mg/dL (ref 0.0–1.0)
pH: 5.5 (ref 5.0–8.0)

## 2021-10-19 MED ORDER — METRONIDAZOLE 500 MG PO TABS: 500.0000 mg | ORAL_TABLET | Freq: Two times a day (BID) | ORAL | 0 refills | Status: DC

## 2021-10-19 MED ORDER — VALACYCLOVIR HCL 1 G PO TABS: 1000.0000 mg | ORAL_TABLET | Freq: Two times a day (BID) | ORAL | 0 refills | Status: AC

## 2021-10-19 NOTE — Discharge Instructions (Addendum)
You were seen today for vaginal symptoms.  ?I have sent out valtrex for your herpes outbreak.  Please take this twice/day x 10 days;  ?I have sent out flagyl for presumed BV.  If the genital swab comes back showing something else we will call you to get you treated.  ?Your urine sample did not show any infection at this time.  ?Please get plenty of rest and fluids.  If symptoms do not improve then please follow up.  ? ?

## 2021-10-19 NOTE — ED Triage Notes (Signed)
Pt reports vaginal discharge, vaginal odor and urinary frequency x 3 days. Pt states she's currently having an outbreak.  ?

## 2021-10-19 NOTE — ED Provider Notes (Signed)
?[facility] ? ? ? ?CSN: [id number] ?Arrival date & time: 10/19/21  0910 ? ? ?  ? ?History   ?Chief Complaint ?Chief Complaint  ?Patient presents with  ? Exposure to STD  ? ? ?HPI ?Stacey Rodriguez is a 26 y.o. female.  ? ?Patient is here for vaginal d/c and odor the last 3 days.  White d/c, not itchy.   No std exposure.  She states she does have h/o BV and this does seem typical for that.  No h/o yeast.  ?Sexually active, 2 partners, no condom use.  ?Also having dysuria.  She is rushing to the bathroom, and not urinating a lot.  ?She is having an outbreak of herpes, where she shaved at. Last outbreak was 4 years ago.  Started 2 days ago.  She did take 2 doses of valtrex yesterday.  ?She uses nexpanon;  no period in a long time.  ?Past Medical History:  ?Diagnosis Date  ? Genital herpes   ? Hx of chlamydia infection 2017  ? Hx of trichomoniasis 2017  ? ? ?Patient Active Problem List  ? Diagnosis Date Noted  ? [redacted] weeks gestation of pregnancy 08/21/2019  ? Indication for care in labor and delivery, antepartum 08/20/2019  ? GBS bacteriuria 07/25/2019  ? GBS (group B Streptococcus carrier), +RV culture, currently pregnant 07/22/2019  ? Alpha thalassemia silent carrier 05/15/2019  ? Sickle cell trait (HCC) 05/15/2019  ? Genital herpes affecting pregnancy 05/15/2019  ? Supervision of other normal pregnancy, antepartum 01/27/2019  ? Substance abuse (H[facility]) 11/11/2015  ? Adjustment disorder with mixed anxiety and depressed mood 11/11/2015  ? ECZEMA 05/27/2008  ? ? ?Past Surgical History:  ?Procedure Laterality Date  ? CESAREAN SECTION N/A 08/21/2019  ? Procedure: CESAREAN SECTION;  Surgeon: Catalina Antigua, MD;  Location: [facility];  Service: Obstetrics;  Laterality: N/A;  ? NO PAST SURGERIES    ? ? ?OB History   ? ? Gravida  ?1  ? Para  ?1  ? Term  ?1  ? Preterm  ?   ? AB  ?   ? Living  ?1  ?  ? ? SAB  ?   ? IAB  ?   ? Ectopic  ?   ? Multiple  ?0  ? Live Births  ?1  ?   ?  ?  ? ? ? ?Home Medications   ? ?Prior to  Admission medications   ?Medication Sig Start Date End Date Taking? Authorizing Provider  ?cephALEXin (KEFLEX) 500 MG capsule Take 1 capsule (500 mg total) by mouth 2 (two) times daily. 08/30/21   Wallis Bamberg, PA-C  ?etonogestrel (NEXPLANON) 68 MG IMPL implant 1 each by Subdermal route once.    [provider]  ?ibuprofen (ADVIL) 600 MG tablet Take 1 tablet (600 mg total) by mouth every 6 (six) hours as needed. 05/27/21   Derwood Kaplan, MD  ?loperamide (IMODIUM) 2 MG capsule Take 1 capsule (2 mg total) by mouth 4 (four) times daily as needed for diarrhea or loose stools. 05/27/21   Derwood Kaplan, MD  ?ondansetron (ZOFRAN ODT) 8 MG disintegrating tablet Take 1 tablet (8 mg total) by mouth every 8 (eight) hours as needed for nausea. 09/27/21   Milagros Loll, MD  ?albuterol (PROVENTIL HFA;VENTOLIN HFA) 108 (90 Base) MCG/ACT inhaler Inhale 1-2 puffs into the lungs every 6 (six) hours as needed for wheezing or shortness of breath. ?Patient not taking: Reported on 01/29/2020 12/13/17 05/19/20  Elvina Sidle, MD  ? ? ?  Family History ?Family History  ?Problem Relation Age of Onset  ? Asthma Mother   ? Healthy Father   ? ? ?Social History ?Social History  ? ?Tobacco Use  ? Smoking status: Never  ? Smokeless tobacco: Never  ?Vaping Use  ? Vaping Use: Never used  ?Substance Use Topics  ? Alcohol use: Not Currently  ?  Comment: ocassionally  ? Drug use: Yes  ?  Types: Marijuana  ?  Comment: Percocet abuse  ? ? ? ?Allergies   ?Patient has no known allergies. ? ? ?Review of Systems ?Review of Systems  ?Constitutional: Negative.   ?HENT: Negative.    ?Respiratory: Negative.    ?Cardiovascular: Negative.   ?Gastrointestinal: Negative.   ?Genitourinary:  Positive for genital sores, urgency and vaginal discharge.  ? ? ?Physical Exam ?Triage Vital Signs ?ED Triage Vitals  ?Enc Vitals Group  ?   BP 10/19/21 0933 101/70  ?   Pulse Rate 10/19/21 0933 93  ?   Resp 10/19/21 0933 16  ?   Temp 10/19/21 0933 98.2 ?F (36.8 ?C)   ?   Temp Source 10/19/21 0933 Oral  ?   SpO2 10/19/21 0933 98 %  ?   Weight 10/19/21 0932 139 lb 15.9 oz (63.5 kg)  ?   Height 10/19/21 0932 5\' 8"  (1.727 m)  ?   Head Circumference --   ?   Peak Flow --   ?   Pain Score 10/19/21 0932 0  ?   Pain Loc --   ?   Pain Edu? --   ?   Excl. in GC? --   ? ?No data found. ? ?Updated Vital Signs ?BP 101/70 (BP Location: Right Arm)   Pulse 93   Temp 98.2 ?F (36.8 ?C) (Oral)   Resp 16   Ht 5\' 8"  (1.727 m)   Wt 63.5 kg   SpO2 98%   BMI 21.29 kg/m?  ? ?Visual Acuity ?Right Eye Distance:   ?Left Eye Distance:   ?Bilateral Distance:   ? ?Right Eye Near:   ?Left Eye Near:    ?Bilateral Near:    ? ?Physical Exam ?Pulmonary:  ?   Effort: Pulmonary effort is normal.  ?Genitourinary: ?   Comments: At the lower pubic ramus area on the right is a cluster of small blister like pustules;  area is tender;  ?Neurological:  ?   Mental Status: She is alert and oriented to person, place, and time.  ?Psychiatric:     ?   Mood and Affect: Mood normal.     ?   Behavior: Behavior normal.  ? ? ? ?UC Treatments / Results  ?Labs ?(all labs ordered are listed, but only abnormal results are displayed) ?Labs Reviewed - No data to display ? ?EKG ? ? ?Radiology ?No results found. ? ?Procedures ?Procedures (including critical care time) ? ?Medications Ordered in UC ?Medications - No data to display ? ?Initial Impression / Assessment and Plan / UC Course  ?I have reviewed the triage vital signs and the nursing notes. ? ?Pertinent labs & imaging results that were available during my care of the patient were reviewed by me and considered in my medical decision making (see chart for details). ? ?Patient was seen today for vaginal symptoms.  Will treat for BV today and await culture before further treatment.  Will treat for herpes outbreak. Urine looked good so will not treat for UTI at this time.    ? ?Final Clinical Impressions(s) / UC  Diagnoses  ? ?Final diagnoses:  ?Vaginal discharge  ?Bacterial  vaginosis  ?Screen for STD (sexually transmitted disease)  ?Genital herpes simplex, unspecified site  ?Dysuria  ? ? ? ?Discharge Instructions   ? ?  ?You were seen today for vaginal symptoms.  ?I have sent out valtrex for your herpes outbreak.  Please take this twice/day x 10 days;  ?I have sent out flagyl for presumed BV.  If the genital swab comes back showing something else we will call you to get you treated.  ?Your urine sample did not show any infection at this time.  ?Please get plenty of rest and fluids.  If symptoms do not improve then please follow up.  ? ? ? ? ?ED Prescriptions   ? ? Medication Sig Dispense Auth. Provider  ? metroNIDAZOLE (FLAGYL) 500 MG tablet Take 1 tablet (500 mg total) by mouth 2 (two) times daily. 14 tablet Azaela Caracci, MD  ? valACYclovir (VALTREX) 1000 MG tablet Take 1 tablet (1,000 mg total) by mouth 2 (two) times daily for 10 days. 20 tablet Jannifer Franklin, MD  ? ?  ? ?PDMP not reviewed this encounter. ?  ?Jannifer Franklin, MD ?10/19/21 1002 ? ?

## 2021-10-20 LAB — CERVICOVAGINAL ANCILLARY ONLY
Bacterial Vaginitis (gardnerella): POSITIVE — AB
Candida Glabrata: NEGATIVE
Candida Vaginitis: NEGATIVE
Chlamydia: NEGATIVE
Comment: NEGATIVE
Comment: NEGATIVE
Comment: NEGATIVE
Comment: NEGATIVE
Comment: NEGATIVE
Comment: NORMAL
Neisseria Gonorrhea: NEGATIVE
Trichomonas: POSITIVE — AB

## 2022-09-14 ENCOUNTER — Ambulatory Visit
Admission: EM | Admit: 2022-09-14 | Discharge: 2022-09-14 | Disposition: A | Discharge status: Home / Self Care | Payer: Medicaid Other | Attending: Internal Medicine | Admitting: Internal Medicine

## 2022-09-14 DIAGNOSIS — N898 Other specified noninflammatory disorders of vagina: Secondary | ICD-10-CM | POA: Insufficient documentation

## 2022-09-14 DIAGNOSIS — Z3202 Encounter for pregnancy test, result negative: Secondary | ICD-10-CM | POA: Diagnosis not present

## 2022-09-14 DIAGNOSIS — Z113 Encounter for screening for infections with a predominantly sexual mode of transmission: Secondary | ICD-10-CM | POA: Diagnosis not present

## 2022-09-14 LAB — POCT URINE PREGNANCY: Preg Test, Ur: NEGATIVE

## 2022-09-14 NOTE — Discharge Instructions (Addendum)
Urine pregnancy was negative.  STD test is pending.  Will call if it is positive and treat as appropriate.  Please follow-up if any symptoms persist or worsen.

## 2022-09-14 NOTE — ED Provider Notes (Signed)
[facility]    CSN: [id number] Arrival date & time: 09/14/22  0904      History   Chief Complaint Chief Complaint  Patient presents with   Vaginal Discharge    HPI Stacey Rodriguez is a 27 y.o. female.   Patient presents for white vaginal discharge and itching that has been present for 2 days.  Patient denies dysuria, urinary frequency, abdominal pain, abnormal vaginal bleeding, dysuria,  urinary frequency, back pain, fever.  Patient denies any confirmed exposure to STD but has had unprotected intercourse.  Last menstrual cycle was 08/14/2022.   Vaginal Discharge   Past Medical History:  Diagnosis Date   Genital herpes    Hx of chlamydia infection 2017   Hx of trichomoniasis 2017    Patient Active Problem List   Diagnosis Date Noted   [redacted] weeks gestation of pregnancy 08/21/2019   Indication for care in labor and delivery, antepartum 08/20/2019   GBS bacteriuria 07/25/2019   GBS (group B Streptococcus carrier), +RV culture, currently pregnant 07/22/2019   Alpha thalassemia silent carrier 05/15/2019   Sickle cell trait ([place]) 05/15/2019   Genital herpes affecting pregnancy 05/15/2019   Supervision of other normal pregnancy, antepartum 01/27/2019   Substance abuse ([place]) 11/11/2015   Adjustment disorder with mixed anxiety and depressed mood 11/11/2015   ECZEMA 05/27/2008    Past Surgical History:  Procedure Laterality Date   CESAREAN SECTION N/A 08/21/2019   Procedure: CESAREAN SECTION;  Surgeon: Mora Bellman, MD;  Location: [facility] LD ORS;  Service: Obstetrics;  Laterality: N/A;   NO PAST SURGERIES      OB History     Gravida  1   Para  1   Term  1   Preterm      AB      Living  1      SAB      IAB      Ectopic      Multiple  0   Live Births  1            Home Medications    Prior to Admission medications   Medication Sig Start Date End Date Taking? Authorizing Provider  etonogestrel (NEXPLANON) 68 MG IMPL implant 1 each by  Subdermal route once.    [provider]  ibuprofen (ADVIL) 600 MG tablet Take 1 tablet (600 mg total) by mouth every 6 (six) hours as needed. 05/27/21   Varney Biles, MD  loperamide (IMODIUM) 2 MG capsule Take 1 capsule (2 mg total) by mouth 4 (four) times daily as needed for diarrhea or loose stools. 05/27/21   Varney Biles, MD  metroNIDAZOLE (FLAGYL) 500 MG tablet Take 1 tablet (500 mg total) by mouth 2 (two) times daily. 10/19/21   Piontek, Junie Panning, MD  ondansetron (ZOFRAN ODT) 8 MG disintegrating tablet Take 1 tablet (8 mg total) by mouth every 8 (eight) hours as needed for nausea. 09/27/21   Lucrezia Starch, MD  albuterol (PROVENTIL HFA;VENTOLIN HFA) 108 (90 Base) MCG/ACT inhaler Inhale 1-2 puffs into the lungs every 6 (six) hours as needed for wheezing or shortness of breath. Patient not taking: Reported on 01/29/2020 12/13/17 05/19/20  Robyn Haber, MD    Family History Family History  Problem Relation Age of Onset   Asthma Mother    Healthy Father     Social History Social History   Tobacco Use   Smoking status: Never   Smokeless tobacco: Never  Vaping Use   Vaping Use: Never used  Substance Use Topics   Alcohol use: Not Currently    Comment: ocassionally   Drug use: Yes    Types: Marijuana    Comment: Percocet abuse     Allergies   Patient has no known allergies.   Review of Systems Review of Systems Per HPI  Physical Exam Triage Vital Signs ED Triage Vitals [09/14/22 1027]  Enc Vitals Group     BP 105/68     Pulse Rate 87     Resp 16     Temp 98.1 F (36.7 C)     Temp Source Oral     SpO2 96 %     Weight      Height      Head Circumference      Peak Flow      Pain Score 0     Pain Loc      Pain Edu?      Excl. in [place]?    No data found.  Updated Vital Signs BP 105/68 (BP Location: Left Arm)   Pulse 87   Temp 98.1 F (36.7 C) (Oral)   Resp 16   LMP 08/14/2022 (Approximate)   SpO2 96%   Visual Acuity Right Eye Distance:    Left Eye Distance:   Bilateral Distance:    Right Eye Near:   Left Eye Near:    Bilateral Near:     Physical Exam Constitutional:      General: She is not in acute distress.    Appearance: Normal appearance. She is not toxic-appearing or diaphoretic.  HENT:     Head: Normocephalic and atraumatic.  Eyes:     Extraocular Movements: Extraocular movements intact.     Conjunctiva/sclera: Conjunctivae normal.  Pulmonary:     Effort: Pulmonary effort is normal.  Genitourinary:    Comments: Deferred with shared decision making. Self swab performed.  Neurological:     General: No focal deficit present.     Mental Status: She is alert and oriented to person, place, and time. Mental status is at baseline.  Psychiatric:        Mood and Affect: Mood normal.        Behavior: Behavior normal.        Thought Content: Thought content normal.        Judgment: Judgment normal.      UC Treatments / Results  Labs (all labs ordered are listed, but only abnormal results are displayed) Labs Reviewed  POCT URINE PREGNANCY  CERVICOVAGINAL ANCILLARY ONLY    EKG   Radiology No results found.  Procedures Procedures (including critical care time)  Medications Ordered in UC Medications - No data to display  Initial Impression / Assessment and Plan / UC Course  I have reviewed the triage vital signs and the nursing notes.  Pertinent labs & imaging results that were available during my care of the patient were reviewed by me and considered in my medical decision making (see chart for details).     Cervicovaginal swab pending.  Given no confirmed exposure to STD, will await results for further treatment.  Highly suspicious of bacterial vaginosis given the patient has recurrent BV at times.  RPR and HIV test pending per patient request as well.  Urine pregnancy negative.  UA deferred given no associated urinary symptoms.  Patient advised to follow-up if symptoms persist or worsen.  Patient  verbalized understanding and was agreeable with plan. Final Clinical Impressions(s) / UC Diagnoses   Final diagnoses:  Vaginal  discharge  Screening examination for venereal disease  Urine pregnancy test negative     Discharge Instructions      Urine pregnancy was negative.  STD test is pending.  Will call if it is positive and treat as appropriate.  Please follow-up if any symptoms persist or worsen.    ED Prescriptions   None    PDMP not reviewed this encounter.   Teodora Medici, [place] 09/14/22 1139

## 2022-09-14 NOTE — ED Triage Notes (Signed)
Pt states white vaginal discharge and itching for the past 2 days.

## 2022-09-15 LAB — CERVICOVAGINAL ANCILLARY ONLY
Bacterial Vaginitis (gardnerella): POSITIVE — AB
Candida Glabrata: NEGATIVE
Candida Vaginitis: NEGATIVE
Chlamydia: NEGATIVE
Comment: NEGATIVE
Comment: NEGATIVE
Comment: NEGATIVE
Comment: NEGATIVE
Comment: NEGATIVE
Comment: NORMAL
Neisseria Gonorrhea: NEGATIVE
Trichomonas: NEGATIVE

## 2022-09-18 ENCOUNTER — Telehealth (HOSPITAL_COMMUNITY): Payer: Self-pay | Admitting: Emergency Medicine

## 2022-09-18 MED ORDER — METRONIDAZOLE 500 MG PO TABS: 500.0000 mg | ORAL_TABLET | Freq: Two times a day (BID) | ORAL | 0 refills | Status: DC

## 2022-10-10 ENCOUNTER — Ambulatory Visit
Admission: EM | Admit: 2022-10-10 | Discharge: 2022-10-10 | Disposition: A | Discharge status: Home / Self Care | Payer: Medicaid Other | Attending: Family Medicine | Admitting: Family Medicine

## 2022-10-10 VITALS — BP 104/69 | HR 86 | Temp 97.5°F | Resp 17

## 2022-10-10 DIAGNOSIS — H6121 Impacted cerumen, right ear: Secondary | ICD-10-CM

## 2022-10-10 DIAGNOSIS — H60501 Unspecified acute noninfective otitis externa, right ear: Secondary | ICD-10-CM | POA: Diagnosis not present

## 2022-10-10 MED ORDER — OFLOXACIN 0.3 % OT SOLN: 5.0000 [drp] | Freq: Two times a day (BID) | OTIC | 0 refills | Status: AC

## 2022-10-10 MED ORDER — CEFDINIR 300 MG PO CAPS: 600.0000 mg | ORAL_CAPSULE | Freq: Every day | ORAL | 0 refills | Status: AC

## 2022-10-10 MED ORDER — KETOROLAC TROMETHAMINE 30 MG/ML IJ SOLN: 30.0000 mg | Freq: Once | INTRAMUSCULAR | Status: DC

## 2022-10-10 MED ORDER — NAPROXEN 500 MG PO TABS: 500.0000 mg | ORAL_TABLET | Freq: Two times a day (BID) | ORAL | 0 refills | Status: DC | PRN

## 2022-10-10 NOTE — ED Notes (Signed)
Pt complains that right ear is worse and has drainage.

## 2022-10-10 NOTE — Discharge Instructions (Signed)
Take cefdinir 300 mg--2 capsules together daily for 7 days  -Floxin eardrops-50s in the affected ear 2 times daily for 7 days.  Take naproxen 500 mg--1 tablet every 12 hours as needed for pain  You can use the QR code/website at the back of the summary paperwork to schedule yourself a new patient appointment with primary care

## 2022-10-10 NOTE — ED Provider Notes (Signed)
[facility]    CSN: [id number] Arrival date & time: 10/10/22  1518      History   Chief Complaint Chief Complaint  Patient presents with   Otalgia    HPI Stacey Rodriguez is a 27 y.o. female.    Otalgia  Here for right-sided ear pain.  She notes both ears feeling stopped up.  No fever or chills or cough or congestion.  Itching in the ears.  Past Medical History:  Diagnosis Date   Genital herpes    Hx of chlamydia infection 2017   Hx of trichomoniasis 2017    Patient Active Problem List   Diagnosis Date Noted   [redacted] weeks gestation of pregnancy 08/21/2019   Indication for care in labor and delivery, antepartum 08/20/2019   GBS bacteriuria 07/25/2019   GBS (group B Streptococcus carrier), +RV culture, currently pregnant 07/22/2019   Alpha thalassemia silent carrier 05/15/2019   Sickle cell trait ([place]) 05/15/2019   Genital herpes affecting pregnancy 05/15/2019   Supervision of other normal pregnancy, antepartum 01/27/2019   Substance abuse ([place]) 11/11/2015   Adjustment disorder with mixed anxiety and depressed mood 11/11/2015   ECZEMA 05/27/2008    Past Surgical History:  Procedure Laterality Date   CESAREAN SECTION N/A 08/21/2019   Procedure: CESAREAN SECTION;  Surgeon: Mora Bellman, MD;  Location: [facility] LD ORS;  Service: Obstetrics;  Laterality: N/A;   NO PAST SURGERIES      OB History     Gravida  1   Para  1   Term  1   Preterm      AB      Living  1      SAB      IAB      Ectopic      Multiple  0   Live Births  1            Home Medications    Prior to Admission medications   Medication Sig Start Date End Date Taking? Authorizing Provider  cefdinir (OMNICEF) 300 MG capsule Take 2 capsules (600 mg total) by mouth daily for 7 days. 10/10/22 10/17/22 Yes Barrett Henle, MD  naproxen (NAPROSYN) 500 MG tablet Take 1 tablet (500 mg total) by mouth 2 (two) times daily as needed (pain). 10/10/22  Yes Barrett Henle,  MD  ofloxacin (FLOXIN) 0.3 % OTIC solution Place 5 drops into both ears 2 (two) times daily for 7 days. 10/10/22 10/17/22 Yes Barrett Henle, MD  etonogestrel (NEXPLANON) 68 MG IMPL implant 1 each by Subdermal route once.    [provider]  albuterol (PROVENTIL HFA;VENTOLIN HFA) 108 (90 Base) MCG/ACT inhaler Inhale 1-2 puffs into the lungs every 6 (six) hours as needed for wheezing or shortness of breath. Patient not taking: Reported on 01/29/2020 12/13/17 05/19/20  Robyn Haber, MD    Family History Family History  Problem Relation Age of Onset   Asthma Mother    Healthy Father     Social History Social History   Tobacco Use   Smoking status: Never   Smokeless tobacco: Never  Vaping Use   Vaping Use: Never used  Substance Use Topics   Alcohol use: Not Currently    Comment: ocassionally   Drug use: Yes    Types: Marijuana    Comment: Percocet abuse     Allergies   Patient has no known allergies.   Review of Systems Review of Systems  HENT:  Positive for ear pain.  Physical Exam Triage Vital Signs ED Triage Vitals  Enc Vitals Group     BP 10/10/22 1541 104/69     Pulse Rate 10/10/22 1541 86     Resp 10/10/22 1541 17     Temp 10/10/22 1541 (!) 97.5 F (36.4 C)     Temp Source 10/10/22 1541 Oral     SpO2 10/10/22 1541 97 %     Weight --      Height --      Head Circumference --      Peak Flow --      Pain Score 10/10/22 1540 8     Pain Loc --      Pain Edu? --      Excl. in [place]? --    No data found.  Updated Vital Signs BP 104/69 (BP Location: Left Arm)   Pulse 86   Temp (!) 97.5 F (36.4 C) (Oral)   Resp 17   LMP 08/14/2022 (Approximate)   SpO2 97%   Visual Acuity Right Eye Distance:   Left Eye Distance:   Bilateral Distance:    Right Eye Near:   Left Eye Near:    Bilateral Near:     Physical Exam Vitals reviewed.  Constitutional:      General: She is not in acute distress.    Appearance: She is not ill-appearing,  toxic-appearing or diaphoretic.  HENT:     Ears:     Comments: There is pain on traction of the right pinna.  Both ear canals have a good bit of cerumen that is obstructive and I cannot see her tympanic membrane's on either side.    Nose: Nose normal.     Mouth/Throat:     Mouth: Mucous membranes are moist.     Pharynx: No oropharyngeal exudate or posterior oropharyngeal erythema.  Eyes:     Extraocular Movements: Extraocular movements intact.     Conjunctiva/sclera: Conjunctivae normal.     Pupils: Pupils are equal, round, and reactive to light.  Cardiovascular:     Rate and Rhythm: Normal rate and regular rhythm.     Heart sounds: No murmur heard. Pulmonary:     Effort: Pulmonary effort is normal. No respiratory distress.     Breath sounds: No stridor. No wheezing, rhonchi or rales.  Musculoskeletal:     Cervical back: Neck supple.  Lymphadenopathy:     Cervical: No cervical adenopathy.  Skin:    Capillary Refill: Capillary refill takes less than 2 seconds.     Coloration: Skin is not jaundiced or pale.  Neurological:     General: No focal deficit present.     Mental Status: She is alert and oriented to person, place, and time.  Psychiatric:        Behavior: Behavior normal.      UC Treatments / Results  Labs (all labs ordered are listed, but only abnormal results are displayed) Labs Reviewed - No data to display  EKG   Radiology No results found.  Procedures Procedures (including critical care time)  Medications Ordered in UC Medications  ketorolac (TORADOL) 30 MG/ML injection 30 mg (has no administration in time range)    Initial Impression / Assessment and Plan / UC Course  I have reviewed the triage vital signs and the nursing notes.  Pertinent labs & imaging results that were available during my care of the patient were reviewed by me and considered in my medical decision making (see chart for details).  I think she has an otitis externa at  least. Though I discussed with her that she may not tolerate it, she wanted to try ear lavage for the cerumen impaction   Staff attempted to lavage of her ears; she did not tolerate much lavage of the right ear, though a little bit of cerumen was obtained.  No cerumen was obtained on the left ear.  Floxin is sent to treat otitis externa, and going to also treat with some oral antibiotics since I cannot visualize her tympanic membranes.  Instructions are given on how to schedule for primary care Final Clinical Impressions(s) / UC Diagnoses   Final diagnoses:  Acute otitis externa of right ear, unspecified type  Impacted cerumen of right ear     Discharge Instructions      Take cefdinir 300 mg--2 capsules together daily for 7 days  -Floxin eardrops-50s in the affected ear 2 times daily for 7 days.  Take naproxen 500 mg--1 tablet every 12 hours as needed for pain  You can use the QR code/website at the back of the summary paperwork to schedule yourself a new patient appointment with primary care       ED Prescriptions     Medication Sig Dispense Auth. Provider   ofloxacin (FLOXIN) 0.3 % OTIC solution Place 5 drops into both ears 2 (two) times daily for 7 days. 5 mL Barrett Henle, MD   cefdinir (OMNICEF) 300 MG capsule Take 2 capsules (600 mg total) by mouth daily for 7 days. 14 capsule Barrett Henle, MD   naproxen (NAPROSYN) 500 MG tablet Take 1 tablet (500 mg total) by mouth 2 (two) times daily as needed (pain). 30 tablet Rock Sobol, Gwenlyn Perking, MD      PDMP not reviewed this encounter.   Barrett Henle, MD 10/10/22 [phone]

## 2022-10-10 NOTE — ED Triage Notes (Signed)
Pt presents with bilateral ear pain and fulness X 2 days.

## 2022-12-02 ENCOUNTER — Encounter (HOSPITAL_COMMUNITY): Payer: Self-pay

## 2022-12-02 ENCOUNTER — Other Ambulatory Visit: Payer: Self-pay

## 2022-12-02 ENCOUNTER — Emergency Department (HOSPITAL_COMMUNITY)
Admission: EM | Admit: 2022-12-02 | Discharge: 2022-12-02 | Disposition: A | Discharge status: Home / Self Care | Payer: Medicaid Other | Attending: Emergency Medicine | Admitting: Emergency Medicine

## 2022-12-02 DIAGNOSIS — F1123 Opioid dependence with withdrawal: Secondary | ICD-10-CM | POA: Diagnosis present

## 2022-12-02 DIAGNOSIS — F1193 Opioid use, unspecified with withdrawal: Secondary | ICD-10-CM

## 2022-12-02 MED ORDER — CLONIDINE HCL 0.1 MG PO TABS
0.1000 mg | ORAL_TABLET | Freq: Once | ORAL | Status: AC
  Administered 2022-12-02: 0.1 mg via ORAL
  Filled 2022-12-02: qty 1

## 2022-12-02 MED ORDER — ONDANSETRON 4 MG PO TBDP: 4.0000 mg | ORAL_TABLET | Freq: Three times a day (TID) | ORAL | 0 refills | Status: DC | PRN

## 2022-12-02 MED ORDER — HYDROXYZINE HCL 25 MG PO TABS: 25.0000 mg | ORAL_TABLET | Freq: Four times a day (QID) | ORAL | 0 refills | Status: DC

## 2022-12-02 MED ORDER — CLONIDINE HCL 0.2 MG PO TABS: 0.1000 mg | ORAL_TABLET | Freq: Three times a day (TID) | ORAL | 0 refills | Status: DC

## 2022-12-02 MED ORDER — HYDROXYZINE HCL 25 MG PO TABS
25.0000 mg | ORAL_TABLET | Freq: Once | ORAL | Status: AC
Start: 2022-12-02 — End: 2022-12-02
  Administered 2022-12-02: 25 mg via ORAL
  Filled 2022-12-02: qty 1

## 2022-12-02 MED ORDER — ONDANSETRON HCL 4 MG/2ML IJ SOLN
4.0000 mg | Freq: Once | INTRAMUSCULAR | Status: AC
  Administered 2022-12-02: 4 mg via INTRAVENOUS
  Filled 2022-12-02: qty 2

## 2022-12-02 NOTE — Discharge Instructions (Signed)
I have sent a few medications to the pharmacy for you. You received your first dose in the emergency department.  For any concerning symptoms return to the emergency department.

## 2022-12-02 NOTE — ED Provider Notes (Signed)
[place] EMERGENCY DEPARTMENT AT [facility] Provider Note   CSN: [id number] Arrival date & time: 12/02/22  4098     History  Chief Complaint  Patient presents with   Withdrawal    Stacey Rodriguez is a 27 y.o. female.  27 year old female presents today for concern of opioid withdrawal.  Last had Percocet on Wednesday.  She appears very anxious.  She states she wants to be discharged.  Does not want to stay.  Following our discussion she is agreeable to stay for her first dose of medications as well as discharged with a couple medications to help with her symptoms.  She states this episode feels similar to the withdrawal episode she has had in the past.  Does not want rehab.  The history is provided by the patient. No language interpreter was used.       Home Medications Prior to Admission medications   Medication Sig Start Date End Date Taking? Authorizing Provider  cloNIDine (CATAPRES) 0.2 MG tablet Take 0.5 tablets (0.1 mg total) by mouth 3 (three) times daily. 12/02/22  Yes Karie Mainland, Tayloranne Lekas, PA-C  hydrOXYzine (ATARAX) 25 MG tablet Take 1 tablet (25 mg total) by mouth every 6 (six) hours. 12/02/22  Yes Dwanda Tufano, PA-C  ondansetron (ZOFRAN-ODT) 4 MG disintegrating tablet Take 1 tablet (4 mg total) by mouth every 8 (eight) hours as needed. 12/02/22  Yes Pratyush Ammon, PA-C  etonogestrel (NEXPLANON) 68 MG IMPL implant 1 each by Subdermal route once.    [provider]  naproxen (NAPROSYN) 500 MG tablet Take 1 tablet (500 mg total) by mouth 2 (two) times daily as needed (pain). 10/10/22   Zenia Resides, MD  albuterol (PROVENTIL HFA;VENTOLIN HFA) 108 (90 Base) MCG/ACT inhaler Inhale 1-2 puffs into the lungs every 6 (six) hours as needed for wheezing or shortness of breath. Patient not taking: Reported on 01/29/2020 12/13/17 05/19/20  Elvina Sidle, MD      Allergies    Patient has no known allergies.    Review of Systems   Review of Systems  Respiratory:   Negative for shortness of breath.   Cardiovascular:  Negative for chest pain.  Gastrointestinal:  Positive for nausea.  Neurological:  Negative for light-headedness.  All other systems reviewed and are negative.   Physical Exam Updated Vital Signs BP (!) 154/117 (BP Location: Right Arm)   Pulse 94   Temp 98.2 F (36.8 C) (Oral)   Resp (!) 24   SpO2 100%  Physical Exam Vitals and nursing note reviewed.  Constitutional:      General: She is not in acute distress.    Appearance: Normal appearance. She is not ill-appearing.  HENT:     Head: Normocephalic and atraumatic.     Nose: Nose normal.  Eyes:     Conjunctiva/sclera: Conjunctivae normal.  Pulmonary:     Effort: Pulmonary effort is normal. No respiratory distress.  Musculoskeletal:        General: No deformity.  Skin:    Findings: No rash.  Neurological:     Mental Status: She is alert.     ED Results / Procedures / Treatments   Labs (all labs ordered are listed, but only abnormal results are displayed) Labs Reviewed - No data to display  EKG None  Radiology No results found.  Procedures Procedures    Medications Ordered in ED Medications  ondansetron (ZOFRAN) injection 4 mg (has no administration in time range)  cloNIDine (CATAPRES) tablet 0.1 mg (has no administration  in time range)  hydrOXYzine (ATARAX) tablet 25 mg (has no administration in time range)    ED Course/ Medical Decision Making/ A&P                             Medical Decision Making Risk Prescription drug management.   27 year old female presents for concern of opiate withdrawals.  Eager for discharge.  Agreeable to stay for first dose of medications in the department as well as medications to go home with.  She appears stable.  Vital signs are stable.  Significant other is at bedside.  They are in agreement with plan.  Discussed return precautions.  Patient discharged in stable condition.  She states she does not want the  medications typically given in the ED.  So she sees no reported staying.  She states she does not want rehab.   Final Clinical Impression(s) / ED Diagnoses Final diagnoses:  Opiate withdrawal    Rx / DC Orders ED Discharge Orders          Ordered    hydrOXYzine (ATARAX) 25 MG tablet  Every 6 hours        12/02/22 0938    ondansetron (ZOFRAN-ODT) 4 MG disintegrating tablet  Every 8 hours PRN        12/02/22 0938    cloNIDine (CATAPRES) 0.2 MG tablet  3 times daily        12/02/22 0938              Marita [place], PA-C 12/02/22 0946    Sloan Leiter, DO 12/02/22 1539

## 2022-12-02 NOTE — ED Triage Notes (Signed)
Per EMS, pt complaining of withdrawal since Wednesday from percocet's. Pt complaining of abd pain, chills/hot flashes, and nausea. Given  zofran by EMS.

## 2023-06-06 ENCOUNTER — Emergency Department (HOSPITAL_COMMUNITY)
Admission: EM | Admit: 2023-06-06 | Discharge: 2023-06-07 | Discharge status: Against Medical Advice | Payer: MEDICAID | Attending: Student | Admitting: Student

## 2023-06-06 ENCOUNTER — Encounter (HOSPITAL_COMMUNITY): Payer: Self-pay | Admitting: Emergency Medicine

## 2023-06-06 DIAGNOSIS — R109 Unspecified abdominal pain: Secondary | ICD-10-CM | POA: Diagnosis present

## 2023-06-06 DIAGNOSIS — Z5321 Procedure and treatment not carried out due to patient leaving prior to being seen by health care provider: Secondary | ICD-10-CM | POA: Diagnosis not present

## 2023-06-06 NOTE — ED Triage Notes (Addendum)
Pt arriving from home via [facility] with abdominal pain x24 hours. Pt takes percocet regularly and has not had any in over 24 hours. Pt non-cooperative upon assessment yelling, demanding a shower and bed.

## 2023-06-11 ENCOUNTER — Ambulatory Visit
Admission: EM | Admit: 2023-06-11 | Discharge: 2023-06-11 | Disposition: A | Discharge status: Home / Self Care | Payer: MEDICAID | Attending: Internal Medicine | Admitting: Internal Medicine

## 2023-06-11 ENCOUNTER — Other Ambulatory Visit: Payer: Self-pay

## 2023-06-11 ENCOUNTER — Encounter: Payer: Self-pay | Admitting: Emergency Medicine

## 2023-06-11 DIAGNOSIS — H6593 Unspecified nonsuppurative otitis media, bilateral: Secondary | ICD-10-CM

## 2023-06-11 MED ORDER — FLUTICASONE PROPIONATE 50 MCG/ACT NA SUSP: 1.0000 | Freq: Every day | NASAL | 0 refills | Status: DC

## 2023-06-11 MED ORDER — CETIRIZINE HCL 10 MG PO TABS: 10.0000 mg | ORAL_TABLET | Freq: Every day | ORAL | 0 refills | Status: DC

## 2023-06-11 NOTE — ED Triage Notes (Signed)
Wednesday started having bilateral ear fullness.  Reports ears are painful.  Reports having liquid coming out of ears.    Has used peroxide in ears.

## 2023-06-11 NOTE — ED Provider Notes (Signed)
[facility]    CSN: [id number] Arrival date & time: 06/11/23  1324      History   Chief Complaint Chief Complaint  Patient presents with   Ear Fullness    HPI Stacey Rodriguez is a 27 y.o. female.   Patient presents with bilateral ear pain that started about 5 days ago.  Denies trauma or foreign body to the ears.  Denies any associated upper respiratory symptoms or fever.  She has not taken any medication for her symptoms.   Ear Fullness    Past Medical History:  Diagnosis Date   Genital herpes    Hx of chlamydia infection 2017   Hx of trichomoniasis 2017    Patient Active Problem List   Diagnosis Date Noted   [redacted] weeks gestation of pregnancy 08/21/2019   Indication for care in labor and delivery, antepartum 08/20/2019   GBS bacteriuria 07/25/2019   GBS (group B Streptococcus carrier), +RV culture, currently pregnant 07/22/2019   Alpha thalassemia silent carrier 05/15/2019   Sickle cell trait (HCC) 05/15/2019   Genital herpes affecting pregnancy 05/15/2019   Supervision of other normal pregnancy, antepartum 01/27/2019   Substance abuse (HCC) 11/11/2015   Adjustment disorder with mixed anxiety and depressed mood 11/11/2015   ECZEMA 05/27/2008    Past Surgical History:  Procedure Laterality Date   CESAREAN SECTION N/A 08/21/2019   Procedure: CESAREAN SECTION;  Surgeon: Catalina Antigua, MD;  Location: [facility];  Service: Obstetrics;  Laterality: N/A;   NO PAST SURGERIES      OB History     Gravida  1   Para  1   Term  1   Preterm      AB      Living  1      SAB      IAB      Ectopic      Multiple  0   Live Births  1            Home Medications    Prior to Admission medications   Medication Sig Start Date End Date Taking? Authorizing Provider  cetirizine (ZYRTEC) 10 MG tablet Take 1 tablet (10 mg total) by mouth daily. 06/11/23  Yes Tori Cupps, Rolly Salter E, FNP  fluticasone (FLONASE) 50 MCG/ACT nasal spray Place 1 spray  into both nostrils daily. 06/11/23  Yes Peace Jost, Rolly Salter E, FNP  cloNIDine (CATAPRES) 0.2 MG tablet Take 0.5 tablets (0.1 mg total) by mouth 3 (three) times daily. Patient not taking: Reported on 06/11/2023 12/02/22   Marita Kansas, PA-C  etonogestrel (NEXPLANON) 68 MG IMPL implant 1 each by Subdermal route once.    [provider]  hydrOXYzine (ATARAX) 25 MG tablet Take 1 tablet (25 mg total) by mouth every 6 (six) hours. Patient not taking: Reported on 06/11/2023 12/02/22   Marita Kansas, PA-C  naproxen (NAPROSYN) 500 MG tablet Take 1 tablet (500 mg total) by mouth 2 (two) times daily as needed (pain). Patient not taking: Reported on 06/11/2023 10/10/22   Zenia Resides, MD  ondansetron (ZOFRAN-ODT) 4 MG disintegrating tablet Take 1 tablet (4 mg total) by mouth every 8 (eight) hours as needed. Patient not taking: Reported on 06/11/2023 12/02/22   Marita Kansas, PA-C  albuterol (PROVENTIL HFA;VENTOLIN HFA) 108 (90 Base) MCG/ACT inhaler Inhale 1-2 puffs into the lungs every 6 (six) hours as needed for wheezing or shortness of breath. Patient not taking: Reported on 01/29/2020 12/13/17 05/19/20  Elvina Sidle, MD    Family History Family  History  Problem Relation Age of Onset   Asthma Mother    Healthy Father     Social History Social History   Tobacco Use   Smoking status: Every Day    Types: Cigars   Smokeless tobacco: Never  Vaping Use   Vaping status: Never Used  Substance Use Topics   Alcohol use: Not Currently    Comment: ocassionally   Drug use: Yes    Types: Marijuana    Comment: Percocet abuse     Allergies   Patient has no known allergies.   Review of Systems Review of Systems Per HPI  Physical Exam Triage Vital Signs ED Triage Vitals  Encounter Vitals Group     BP 06/11/23 1401 104/64     Systolic BP Percentile --      Diastolic BP Percentile --      Pulse Rate 06/11/23 1401 92     Resp 06/11/23 1401 18     Temp 06/11/23 1401 98.1 F (36.7 C)     Temp  Source 06/11/23 1401 Oral     SpO2 06/11/23 1401 98 %     Weight --      Height --      Head Circumference --      Peak Flow --      Pain Score 06/11/23 1359 10     Pain Loc --      Pain Education --      Exclude from Growth Chart --    No data found.  Updated Vital Signs BP 104/64 (BP Location: Left Arm)   Pulse 92   Temp 98.1 F (36.7 C) (Oral)   Resp 18   LMP 06/04/2023   SpO2 98%   Visual Acuity Right Eye Distance:   Left Eye Distance:   Bilateral Distance:    Right Eye Near:   Left Eye Near:    Bilateral Near:     Physical Exam Constitutional:      General: She is not in acute distress.    Appearance: Normal appearance. She is not toxic-appearing or diaphoretic.  HENT:     Right Ear: Ear canal and external ear normal. No drainage, swelling or tenderness. A middle ear effusion is present. Tympanic membrane is not perforated, erythematous or bulging.     Left Ear: Ear canal and external ear normal. No drainage, swelling or tenderness. A middle ear effusion is present. Tympanic membrane is not perforated, erythematous or bulging.  Pulmonary:     Effort: Pulmonary effort is normal.  Neurological:     General: No focal deficit present.     Mental Status: She is alert and oriented to person, place, and time. Mental status is at baseline.  Psychiatric:        Mood and Affect: Mood normal.        Behavior: Behavior normal.      UC Treatments / Results  Labs (all labs ordered are listed, but only abnormal results are displayed) Labs Reviewed - No data to display  EKG   Radiology No results found.  Procedures Procedures (including critical care time)  Medications Ordered in UC Medications - No data to display  Initial Impression / Assessment and Plan / UC Course  I have reviewed the triage vital signs and the nursing notes.  Pertinent labs & imaging results that were available during my care of the patient were reviewed by me and considered in my  medical decision making (see chart for details).  Patient has fluid behind TMs bilaterally.  Will treat with Flonase and cetirizine antihistamine as patient denies that she takes any medications daily.  Advised strict follow-up if symptoms persist or worsen.  Patient and her support person at bedside verbalized understanding and were agreeable with plan. Final Clinical Impressions(s) / UC Diagnoses   Final diagnoses:  Fluid level behind tympanic membrane of both ears     Discharge Instructions      You have fluid behind your eardrum so I have prescribed a nasal spray and cetirizine medication to help with this.  Please follow-up if any symptoms persist or worsen.    ED Prescriptions     Medication Sig Dispense Auth. Provider   fluticasone (FLONASE) 50 MCG/ACT nasal spray Place 1 spray into both nostrils daily. 16 g Ervin Knack E, [place]   cetirizine (ZYRTEC) 10 MG tablet Take 1 tablet (10 mg total) by mouth daily. 30 tablet [place], Acie Fredrickson, [place]      PDMP not reviewed this encounter.   Gustavus Bryant, [place] 06/11/23 1419

## 2023-06-11 NOTE — Discharge Instructions (Signed)
You have fluid behind your eardrum so I have prescribed a nasal spray and cetirizine medication to help with this.  Please follow-up if any symptoms persist or worsen.

## 2023-07-03 ENCOUNTER — Ambulatory Visit (HOSPITAL_COMMUNITY)
Admission: EM | Admit: 2023-07-03 | Discharge: 2023-07-03 | Disposition: A | Discharge status: Home / Self Care | Payer: MEDICAID | Attending: Internal Medicine | Admitting: Internal Medicine

## 2023-07-03 ENCOUNTER — Other Ambulatory Visit: Payer: Self-pay

## 2023-07-03 ENCOUNTER — Encounter (HOSPITAL_COMMUNITY): Payer: Self-pay | Admitting: *Deleted

## 2023-07-03 DIAGNOSIS — H6692 Otitis media, unspecified, left ear: Secondary | ICD-10-CM

## 2023-07-03 DIAGNOSIS — H9202 Otalgia, left ear: Secondary | ICD-10-CM

## 2023-07-03 MED ORDER — NEOMYCIN-POLYMYXIN-HC 3.5-10000-1 OT SUSP: 3.0000 [drp] | Freq: Three times a day (TID) | OTIC | 0 refills | Status: DC

## 2023-07-03 MED ORDER — IBUPROFEN 800 MG PO TABS: 800.0000 mg | ORAL_TABLET | Freq: Three times a day (TID) | ORAL | 0 refills | Status: AC

## 2023-07-03 MED ORDER — AMOXICILLIN-POT CLAVULANATE 875-125 MG PO TABS: 1.0000 | ORAL_TABLET | Freq: Two times a day (BID) | ORAL | 0 refills | Status: DC

## 2023-07-03 NOTE — ED Provider Notes (Signed)
[facility]    CSN: [id number] Arrival date & time: 07/03/23  4098      History   Chief Complaint Chief Complaint  Patient presents with   Otalgia    HPI Stacey Rodriguez is a 27 y.o. female who presents with L ear pain since this am. She states she was seen for [facility] 3 weeks ago and has used the decongestants she was given. Has chronic wax buildup. Had a HA yesterday and did a home covid test which is negative. Does not have any other symptoms today but the ear pain    Past Medical History:  Diagnosis Date   Genital herpes    Hx of chlamydia infection 2017   Hx of trichomoniasis 2017    Patient Active Problem List   Diagnosis Date Noted   [redacted] weeks gestation of pregnancy 08/21/2019   Indication for care in labor and delivery, antepartum 08/20/2019   GBS bacteriuria 07/25/2019   GBS (group B Streptococcus carrier), +RV culture, currently pregnant 07/22/2019   Alpha thalassemia silent carrier 05/15/2019   Sickle cell trait (HCC) 05/15/2019   Genital herpes affecting pregnancy 05/15/2019   Supervision of other normal pregnancy, antepartum 01/27/2019   Substance abuse (HCC) 11/11/2015   Adjustment disorder with mixed anxiety and depressed mood 11/11/2015   ECZEMA 05/27/2008    Past Surgical History:  Procedure Laterality Date   CESAREAN SECTION N/A 08/21/2019   Procedure: CESAREAN SECTION;  Surgeon: Catalina Antigua, MD;  Location: [facility];  Service: Obstetrics;  Laterality: N/A;   NO PAST SURGERIES      OB History     Gravida  1   Para  1   Term  1   Preterm      AB      Living  1      SAB      IAB      Ectopic      Multiple  0   Live Births  1            Home Medications    Prior to Admission medications   Medication Sig Start Date End Date Taking? Authorizing Provider  amoxicillin-clavulanate (AUGMENTIN) 875-125 MG tablet Take 1 tablet by mouth every 12 (twelve) hours. 07/03/23  Yes Rodriguez-Southworth, Nettie Elm, PA-C   ibuprofen (ADVIL) 800 MG tablet Take 1 tablet (800 mg total) by mouth 3 (three) times daily. 07/03/23  Yes Rodriguez-Southworth, Nettie Elm, PA-C  neomycin-polymyxin-hydrocortisone (CORTISPORIN) 3.5-10000-1 OTIC suspension Place 3 drops into the left ear 3 (three) times daily. For 7 days 07/03/23  Yes Rodriguez-Southworth, Viviana Simpler  etonogestrel (NEXPLANON) 68 MG IMPL implant 1 each by Subdermal route once.    [provider]  albuterol (PROVENTIL HFA;VENTOLIN HFA) 108 (90 Base) MCG/ACT inhaler Inhale 1-2 puffs into the lungs every 6 (six) hours as needed for wheezing or shortness of breath. Patient not taking: Reported on 01/29/2020 12/13/17 05/19/20  Elvina Sidle, MD    Family History Family History  Problem Relation Age of Onset   Asthma Mother    Healthy Father     Social History Social History   Tobacco Use   Smoking status: Every Day    Types: Cigars   Smokeless tobacco: Never  Vaping Use   Vaping status: Never Used  Substance Use Topics   Alcohol use: Not Currently    Comment: ocassionally   Drug use: Yes    Types: Marijuana    Comment: Percocet abuse     Allergies  Patient has no known allergies.   Review of Systems Review of Systems As noted in HPI  Physical Exam Triage Vital Signs ED Triage Vitals  Encounter Vitals Group     BP 07/03/23 1033 105/68     Systolic BP Percentile --      Diastolic BP Percentile --      Pulse Rate 07/03/23 1033 89     Resp 07/03/23 1033 20     Temp 07/03/23 1033 98.5 F (36.9 C)     Temp src --      SpO2 07/03/23 1033 98 %     Weight --      Height --      Head Circumference --      Peak Flow --      Pain Score 07/03/23 1031 10     Pain Loc --      Pain Education --      Exclude from Growth Chart --    No data found.  Updated Vital Signs BP 105/68   Pulse 89   Temp 98.5 F (36.9 C)   Resp 20   LMP 06/04/2023   SpO2 98%   Visual Acuity Right Eye Distance:   Left Eye Distance:   Bilateral  Distance:    Right Eye Near:   Left Eye Near:    Bilateral Near:     Physical Exam Vitals and nursing note reviewed.  Constitutional:      General: She is not in acute distress.    Appearance: She is not toxic-appearing.     Comments: She was on the phone the entire duration of the visit  HENT:     Left Ear: There is impacted cerumen.     Ears:     Comments: She is guarded for me to look in her ear, has mild pain with external ear motion. I can't see her TM due to wax buildup and she wont be able to tolerate the lavage today since she is so guarded.  Eyes:     General: No scleral icterus.    Conjunctiva/sclera: Conjunctivae normal.  Pulmonary:     Effort: Pulmonary effort is normal.  Musculoskeletal:     Cervical back: Neck supple.  Lymphadenopathy:     Cervical: No cervical adenopathy.  Neurological:     Mental Status: She is alert and oriented to person, place, and time.     Gait: Gait normal.  Psychiatric:        Mood and Affect: Mood normal.        Behavior: Behavior normal.        Thought Content: Thought content normal.        Judgment: Judgment normal.      UC Treatments / Results  Labs (all labs ordered are listed, but only abnormal results are displayed) Labs Reviewed - No data to display  EKG   Radiology No results found.  Procedures Procedures (including critical care time)  Medications Ordered in UC Medications - No data to display  Initial Impression / Assessment and Plan / UC Course  I have reviewed the triage vital signs and the nursing notes. Due to her pain, she is not able to tolerate the lavage today since she is so guarded.  R otalgia, possible OE and OM  I placed her on Augmentin, Cortisporin otic gtts as noted and advised to FU with ENT Final Clinical Impressions(s) / UC Diagnoses   Final diagnoses:  Left ear pain  Acute otitis media,  left   Discharge Instructions   None    ED Prescriptions     Medication Sig Dispense  Auth. Provider   amoxicillin-clavulanate (AUGMENTIN) 875-125 MG tablet Take 1 tablet by mouth every 12 (twelve) hours. 14 tablet Rodriguez-Southworth, Jene Oravec, PA-C   neomycin-polymyxin-hydrocortisone (CORTISPORIN) 3.5-10000-1 OTIC suspension Place 3 drops into the left ear 3 (three) times daily. For 7 days 10 mL Rodriguez-Southworth, Nettie Elm, PA-C   ibuprofen (ADVIL) 800 MG tablet Take 1 tablet (800 mg total) by mouth 3 (three) times daily. 21 tablet Rodriguez-Southworth, Nettie Elm, PA-C      PDMP not reviewed this encounter.   Garey Ham, PA-C 07/03/23 1103

## 2023-07-03 NOTE — ED Triage Notes (Signed)
Pt reports LT ear pain started this morning pain 10/10

## 2023-09-22 ENCOUNTER — Emergency Department (HOSPITAL_COMMUNITY): Payer: MEDICAID

## 2023-09-22 ENCOUNTER — Encounter (HOSPITAL_COMMUNITY): Payer: Self-pay | Admitting: Emergency Medicine

## 2023-09-22 ENCOUNTER — Observation Stay (HOSPITAL_COMMUNITY)
Admission: EM | Admit: 2023-09-22 | Discharge: 2023-09-24 | Disposition: A | Discharge status: Home / Self Care | Payer: MEDICAID | Attending: Internal Medicine | Admitting: Internal Medicine

## 2023-09-22 DIAGNOSIS — T40601A Poisoning by unspecified narcotics, accidental (unintentional), initial encounter: Principal | ICD-10-CM

## 2023-09-22 DIAGNOSIS — T402X1A Poisoning by other opioids, accidental (unintentional), initial encounter: Secondary | ICD-10-CM | POA: Diagnosis not present

## 2023-09-22 DIAGNOSIS — X58XXXA Exposure to other specified factors, initial encounter: Secondary | ICD-10-CM | POA: Diagnosis not present

## 2023-09-22 DIAGNOSIS — T403X1A Poisoning by methadone, accidental (unintentional), initial encounter: Secondary | ICD-10-CM | POA: Diagnosis present

## 2023-09-22 DIAGNOSIS — T50901A Poisoning by unspecified drugs, medicaments and biological substances, accidental (unintentional), initial encounter: Secondary | ICD-10-CM | POA: Diagnosis present

## 2023-09-22 DIAGNOSIS — F1290 Cannabis use, unspecified, uncomplicated: Secondary | ICD-10-CM | POA: Insufficient documentation

## 2023-09-22 DIAGNOSIS — N3001 Acute cystitis with hematuria: Secondary | ICD-10-CM | POA: Insufficient documentation

## 2023-09-22 DIAGNOSIS — N182 Chronic kidney disease, stage 2 (mild): Secondary | ICD-10-CM | POA: Diagnosis not present

## 2023-09-22 DIAGNOSIS — F17210 Nicotine dependence, cigarettes, uncomplicated: Secondary | ICD-10-CM | POA: Insufficient documentation

## 2023-09-22 DIAGNOSIS — F1910 Other psychoactive substance abuse, uncomplicated: Secondary | ICD-10-CM | POA: Diagnosis present

## 2023-09-22 DIAGNOSIS — Z79899 Other long term (current) drug therapy: Secondary | ICD-10-CM | POA: Diagnosis not present

## 2023-09-22 LAB — CBC
HCT: 42.1 % (ref 36.0–46.0)
Hemoglobin: 13.9 g/dL (ref 12.0–15.0)
MCH: 31 pg (ref 26.0–34.0)
MCHC: 33 g/dL (ref 30.0–36.0)
MCV: 93.8 fL (ref 80.0–100.0)
Platelets: 177 10*3/uL (ref 150–400)
RBC: 4.49 MIL/uL (ref 3.87–5.11)
RDW: 13.5 % (ref 11.5–15.5)
WBC: 17 10*3/uL — ABNORMAL HIGH (ref 4.0–10.5)
nRBC: 0 % (ref 0.0–0.2)

## 2023-09-22 LAB — ETHANOL: Alcohol, Ethyl (B): <10 mg/dL (ref <10)

## 2023-09-22 LAB — COMPREHENSIVE METABOLIC PANEL
ALT: 22 U/L (ref 0–44)
AST: 37 U/L (ref 15–41)
Albumin: 4.6 g/dL (ref 3.5–5.0)
Alkaline Phosphatase: 51 U/L (ref 38–126)
Anion gap: 15 (ref 5–15)
BUN: 20 mg/dL (ref 6–20)
CO2: 23 mmol/L (ref 22–32)
Calcium: 9 mg/dL (ref 8.9–10.3)
Chloride: 101 mmol/L (ref 98–111)
Creatinine, Ser: 1.33 mg/dL — ABNORMAL HIGH (ref 0.44–1.00)
GFR, Estimated: 56 mL/min — ABNORMAL LOW (ref >60)
Glucose, Bld: 141 mg/dL — ABNORMAL HIGH (ref 70–99)
Potassium: 4.2 mmol/L (ref 3.5–5.1)
Sodium: 139 mmol/L (ref 135–145)
Total Bilirubin: 0.7 mg/dL (ref 0.0–1.2)
Total Protein: 8.4 g/dL — ABNORMAL HIGH (ref 6.5–8.1)

## 2023-09-22 LAB — RAPID URINE DRUG SCREEN, HOSP PERFORMED
Amphetamines: NOT DETECTED
Barbiturates: NOT DETECTED
Benzodiazepines: NOT DETECTED
Cocaine: NOT DETECTED
Opiates: NOT DETECTED
Tetrahydrocannabinol: POSITIVE — AB

## 2023-09-22 LAB — URINALYSIS, ROUTINE W REFLEX MICROSCOPIC
Bilirubin Urine: NEGATIVE
Glucose, UA: 50 mg/dL — AB
Ketones, ur: NEGATIVE mg/dL
Nitrite: NEGATIVE
Protein, ur: 100 mg/dL — AB
Specific Gravity, Urine: 1.013 (ref 1.005–1.030)
pH: 5 (ref 5.0–8.0)

## 2023-09-22 LAB — PREGNANCY, URINE: Preg Test, Ur: NEGATIVE

## 2023-09-22 LAB — HCG, SERUM, QUALITATIVE: Preg, Serum: NEGATIVE

## 2023-09-22 LAB — SALICYLATE LEVEL: Salicylate Lvl: <7 mg/dL — ABNORMAL LOW (ref 7.0–30.0)

## 2023-09-22 LAB — ACETAMINOPHEN LEVEL: Acetaminophen (Tylenol), Serum: <10 ug/mL — ABNORMAL LOW (ref 10–30)

## 2023-09-22 MED ORDER — ONDANSETRON HCL 4 MG/2ML IJ SOLN
4.0000 mg | Freq: Once | INTRAMUSCULAR | Status: AC
  Administered 2023-09-22: 4 mg via INTRAVENOUS
  Filled 2023-09-22: qty 2

## 2023-09-22 MED ORDER — NALOXONE HCL 2 MG/2ML IJ SOSY
1.0000 mg | PREFILLED_SYRINGE | Freq: Once | INTRAMUSCULAR | Status: AC
  Administered 2023-09-22: 1 mg via INTRAVENOUS
  Filled 2023-09-22: qty 2

## 2023-09-22 MED ORDER — SODIUM CHLORIDE 0.9 % IV SOLN
1.0000 g | Freq: Once | INTRAVENOUS | Status: AC
  Administered 2023-09-22: 1 g via INTRAVENOUS
  Filled 2023-09-22: qty 10

## 2023-09-22 NOTE — ED Notes (Signed)
 Spoke with pt's mom on the phone.  Advised us  that she has the pt's child in her care and that the pt's grandmother is on the way to be with her here.

## 2023-09-22 NOTE — ED Provider Notes (Signed)
 [place] EMERGENCY DEPARTMENT AT [facility] Provider Note   CSN: [id number] Arrival date & time: 09/22/23  2132     History  Chief Complaint  Patient presents with   Drug Overdose    Stacey Rodriguez is a 28 y.o. female.   Drug Overdose   Patient has a history of substance use disorder, anxiety, sickle cell trait  Patient presented to the ED for evaluation of possible drug overdose.  EMS was called to patient's home for possible drug overdose.  Patient was found to have agonal respirations.  She was given 2 mg of Narcan  with some improvement.  Patient reportedly had a prescription for methadone but there was no bottle present.  In the ED patient will look at me but is not answering any my questions she is very somnolent  Patient now tells me that she did take 1 Percocet.  Patient however states she is having difficulty hearing so it is hard to communicate with her.  She also is complaining of some cramping in her left hip  Home Medications Prior to Admission medications   Medication Sig Start Date End Date Taking? Authorizing Provider  amoxicillin -clavulanate (AUGMENTIN ) 875-125 MG tablet Take 1 tablet by mouth every 12 (twelve) hours. 07/03/23   Rodriguez-Southworth, Sylvia, PA-C  etonogestrel  (NEXPLANON ) 68 MG IMPL implant 1 each by Subdermal route once.    [provider]  ibuprofen  (ADVIL ) 800 MG tablet Take 1 tablet (800 mg total) by mouth 3 (three) times daily. 07/03/23   Rodriguez-Southworth, Sylvia, PA-C  neomycin -polymyxin-hydrocortisone  (CORTISPORIN) 3.5-10000-1 OTIC suspension Place 3 drops into the left ear 3 (three) times daily. For 7 days 07/03/23   Rodriguez-Southworth, Kyra, PA-C  albuterol  (PROVENTIL  HFA;VENTOLIN  HFA) 108 (90 Base) MCG/ACT inhaler Inhale 1-2 puffs into the lungs every 6 (six) hours as needed for wheezing or shortness of breath. Patient not taking: Reported on 01/29/2020 12/13/17 05/19/20  Mario Million, MD       Allergies    Patient has no known allergies.    Review of Systems   Review of Systems  Physical Exam Updated Vital Signs BP 122/83   Pulse (!) 102   Resp 13   SpO2 99%  Physical Exam Vitals and nursing note reviewed.  Constitutional:      Appearance: She is well-developed. She is ill-appearing.  HENT:     Head: Normocephalic and atraumatic.     Right Ear: External ear normal.     Left Ear: External ear normal.  Eyes:     General: No scleral icterus.       Right eye: No discharge.        Left eye: No discharge.     Conjunctiva/sclera: Conjunctivae normal.  Neck:     Trachea: No tracheal deviation.  Cardiovascular:     Rate and Rhythm: Normal rate and regular rhythm.  Pulmonary:     Effort: Pulmonary effort is normal. No respiratory distress.     Breath sounds: Normal breath sounds. No stridor. No wheezing or rales.  Abdominal:     General: Bowel sounds are normal. There is no distension.     Palpations: Abdomen is soft.     Tenderness: There is no abdominal tenderness. There is no guarding or rebound.  Musculoskeletal:        General: No tenderness or deformity.     Cervical back: Neck supple.  Skin:    General: Skin is warm and dry.     Findings: No rash.  Neurological:  General: No focal deficit present.     Mental Status: She is lethargic.     GCS: GCS eye subscore is 3. GCS verbal subscore is 1. GCS motor subscore is 5.     Cranial Nerves: No facial asymmetry.     Comments: Patient not following commands, does look at me when I speak to her  Psychiatric:        Mood and Affect: Mood normal.     ED Results / Procedures / Treatments   Labs (all labs ordered are listed, but only abnormal results are displayed) Labs Reviewed  COMPREHENSIVE METABOLIC PANEL - Abnormal; Notable for the following components:      Result Value   Glucose, Bld 141 (*)    Creatinine, Ser 1.33 (*)    Total Protein 8.4 (*)    GFR, Estimated 56 (*)    All other components  within normal limits  SALICYLATE LEVEL - Abnormal; Notable for the following components:   Salicylate Lvl <7.0 (*)    All other components within normal limits  ACETAMINOPHEN  LEVEL - Abnormal; Notable for the following components:   Acetaminophen  (Tylenol ), Serum <10 (*)    All other components within normal limits  CBC - Abnormal; Notable for the following components:   WBC 17.0 (*)    All other components within normal limits  URINALYSIS, ROUTINE W REFLEX MICROSCOPIC - Abnormal; Notable for the following components:   APPearance CLOUDY (*)    Glucose, UA 50 (*)    Hgb urine dipstick LARGE (*)    Protein, ur 100 (*)    Leukocytes,Ua TRACE (*)    Bacteria, UA MANY (*)    All other components within normal limits  ETHANOL  HCG, SERUM, QUALITATIVE  PREGNANCY, URINE  RAPID URINE DRUG SCREEN, HOSP PERFORMED    EKG EKG Interpretation Date/Time:  Saturday September 22 2023 21:55:48 EST Ventricular Rate:  103 PR Interval:  136 QRS Duration:  71 QT Interval:  374 QTC Calculation: 490 R Axis:   58  Text Interpretation: Sinus tachycardia LAE, consider biatrial enlargement ST elev, probable normal early repol pattern Borderline prolonged QT interval Since last tracing rate faster Confirmed by Randol Simmonds [phone]) on 09/22/2023 10:14:59 PM  Radiology CT Head Wo Contrast Result Date: 09/22/2023 CLINICAL DATA:  Altered mental status. EXAM: CT HEAD WITHOUT CONTRAST TECHNIQUE: Contiguous axial images were obtained from the base of the skull through the vertex without intravenous contrast. RADIATION DOSE REDUCTION: This exam was performed according to the departmental dose-optimization program which includes automated exposure control, adjustment of the mA and/or kV according to patient size and/or use of iterative reconstruction technique. COMPARISON:  July 03, 2014 FINDINGS: Brain: No evidence of acute infarction, hemorrhage, hydrocephalus, extra-axial collection or mass lesion/mass effect.  Vascular: No hyperdense vessel or unexpected calcification. Skull: Normal. Negative for fracture or focal lesion. Sinuses/Orbits: Very mild right ethmoid sinus mucosal thickening is seen. Other: None. IMPRESSION: No acute intracranial abnormality. Electronically Signed   By: Suzen Dials M.D.   On: 09/22/2023 23:39   DG Chest Portable 1 View Result Date: 09/22/2023 CLINICAL DATA:  Altered mental status. EXAM: PORTABLE CHEST 1 VIEW COMPARISON:  February 04, 2017 FINDINGS: The heart size and mediastinal contours are within normal limits. Both lungs are clear. The visualized skeletal structures are unremarkable. IMPRESSION: No active disease. Electronically Signed   By: Suzen Dials M.D.   On: 09/22/2023 22:52    Procedures Procedures    Medications Ordered in ED Medications  cefTRIAXone  (ROCEPHIN )  1 g in sodium chloride  0.9 % 100 mL IVPB (has no administration in time range)  naloxone  (NARCAN ) injection 1 mg (1 mg Intravenous Given 09/22/23 2223)    ED Course/ Medical Decision Making/ A&P Clinical Course as of 09/22/23 2345  Sat Sep 22, 2023  2214 White blood cell count elevated 17 [JK]  2238 Patient was given Narcan  and now she is alert and answering questions.  Patient admits to taking 1 Percocet.  She denies anything else.  Patient also states she is having trouble hearing loss.  This seems to be intermittent as she will occasionally answer in response to something said to her verbally but then will otherwise states she cannot hear us .  I did write things down on paper for her [JK]  2335 Urinalysis does show trace leukocyte esterase many bacteria. [JK]  2335 CBC shows leukocytosis.  Metabolic panel does show slight increase in creatinine.  Pregnancy test negative.  Salicylate and acetaminophen  levels are negative. [JK]    Clinical Course User Index [JK] Randol Simmonds, MD                                 Medical Decision Making Amount and/or Complexity of Data Reviewed Labs:  ordered. Radiology: ordered.  Risk Prescription drug management.   Patient presented to the ED for evaluation after a suspected drug overdose.  Patient was initially very somnolent on arrival.  She was given Narcan  with significant improvement of her symptoms.  Medical records indicates she does have a history of substance abuse.  Patient however indicated she was having difficulty hearing after narcan .  Unclear if there is a possible psychosomatic component with this.  I do not see any signs of cerumen impaction.  Be unlikely that an overdose would cause acute hearing dysfunction.  Will continue to monitor.  Patient's labs do show elevated white blood cell count.  Urinalysis shows the  possibility of urinary tract infection.  Will treat with rocephin .  Case turned over to Dr Griselda at shift change.        Final Clinical Impression(s) / ED Diagnoses Final diagnoses:  Opiate overdose, accidental or unintentional, initial encounter [facility])  Acute cystitis with hematuria    Rx / DC Orders ED Discharge Orders     None         Randol Simmonds, MD 09/22/23 2345

## 2023-09-22 NOTE — ED Triage Notes (Signed)
 BIB EMS from home called out for overdose. Pt had agonal respirations, 2mg  IN narcan  by fire.  Pt is disoriented on arrival, Unknown what she took.  Has a script for methadone but no bottle present.

## 2023-09-22 NOTE — ED Provider Notes (Signed)
 Care assumed at 2330.  Patient here following drug overdose, did initially respond to Narcan .  Care assumed reevaluation.  Patient with increased somnolence, hypoxia and low respiratory rate.  She was treated with additional naloxone  with improvement in her respiratory rate but she does not fall back asleep quickly.  Will continue to monitor.  Patient required additional dose of naloxone  for respiratory depression.  She was able to awaken enough to report that she had taken methadone, unclear quantity.  She has stated doses between half a tablet to an entire cup.  Poison control contacted with recommendation for 24-hour observation.  Medicine consulted for admission for ongoing monitoring.   Griselda Norris, MD 09/23/23 0730

## 2023-09-23 ENCOUNTER — Other Ambulatory Visit: Payer: Self-pay

## 2023-09-23 DIAGNOSIS — F1290 Cannabis use, unspecified, uncomplicated: Secondary | ICD-10-CM | POA: Diagnosis not present

## 2023-09-23 DIAGNOSIS — F1910 Other psychoactive substance abuse, uncomplicated: Secondary | ICD-10-CM

## 2023-09-23 DIAGNOSIS — T50901A Poisoning by unspecified drugs, medicaments and biological substances, accidental (unintentional), initial encounter: Secondary | ICD-10-CM | POA: Diagnosis present

## 2023-09-23 DIAGNOSIS — N3001 Acute cystitis with hematuria: Secondary | ICD-10-CM

## 2023-09-23 DIAGNOSIS — T40601A Poisoning by unspecified narcotics, accidental (unintentional), initial encounter: Secondary | ICD-10-CM | POA: Diagnosis not present

## 2023-09-23 DIAGNOSIS — T403X1A Poisoning by methadone, accidental (unintentional), initial encounter: Secondary | ICD-10-CM

## 2023-09-23 DIAGNOSIS — N182 Chronic kidney disease, stage 2 (mild): Secondary | ICD-10-CM | POA: Diagnosis not present

## 2023-09-23 DIAGNOSIS — F17210 Nicotine dependence, cigarettes, uncomplicated: Secondary | ICD-10-CM | POA: Diagnosis not present

## 2023-09-23 LAB — CBC
HCT: 41.3 % (ref 36.0–46.0)
Hemoglobin: 13.7 g/dL (ref 12.0–15.0)
MCH: 30.5 pg (ref 26.0–34.0)
MCHC: 33.2 g/dL (ref 30.0–36.0)
MCV: 92 fL (ref 80.0–100.0)
Platelets: 164 10*3/uL (ref 150–400)
RBC: 4.49 MIL/uL (ref 3.87–5.11)
RDW: 13.4 % (ref 11.5–15.5)
WBC: 17.3 10*3/uL — ABNORMAL HIGH (ref 4.0–10.5)
nRBC: 0 % (ref 0.0–0.2)

## 2023-09-23 LAB — CREATININE, SERUM
Creatinine, Ser: 0.79 mg/dL (ref 0.44–1.00)
GFR, Estimated: >60 mL/min (ref >60)

## 2023-09-23 LAB — HIV ANTIBODY (ROUTINE TESTING W REFLEX): HIV Screen 4th Generation wRfx: NONREACTIVE

## 2023-09-23 LAB — ACETAMINOPHEN LEVEL: Acetaminophen (Tylenol), Serum: <10 ug/mL — ABNORMAL LOW (ref 10–30)

## 2023-09-23 LAB — MRSA NEXT GEN BY PCR, NASAL: MRSA by PCR Next Gen: NOT DETECTED

## 2023-09-23 MED ORDER — ENOXAPARIN SODIUM 40 MG/0.4ML IJ SOSY
40.0000 mg | PREFILLED_SYRINGE | INTRAMUSCULAR | Status: DC
  Administered 2023-09-23 – 2023-09-24 (×2): 40 mg via SUBCUTANEOUS
  Filled 2023-09-23 (×2): qty 0.4

## 2023-09-23 MED ORDER — NALOXONE HCL 0.4 MG/ML IJ SOLN
INTRAMUSCULAR | Status: AC
  Administered 2023-09-23: 0.4 mg
  Filled 2023-09-23: qty 1

## 2023-09-23 MED ORDER — PROCHLORPERAZINE EDISYLATE 10 MG/2ML IJ SOLN
5.0000 mg | INTRAMUSCULAR | Status: DC | PRN
  Administered 2023-09-23 – 2023-09-24 (×2): 5 mg via INTRAVENOUS
  Filled 2023-09-23 (×2): qty 2

## 2023-09-23 MED ORDER — CHLORHEXIDINE GLUCONATE CLOTH 2 % EX PADS
6.0000 | MEDICATED_PAD | Freq: Every day | CUTANEOUS | Status: DC
  Administered 2023-09-23: 6 via TOPICAL

## 2023-09-23 MED ORDER — NALOXONE HCL 2 MG/2ML IJ SOSY
1.0000 mg | PREFILLED_SYRINGE | Freq: Once | INTRAMUSCULAR | Status: AC
  Administered 2023-09-23: 1 mg via INTRAVENOUS
  Filled 2023-09-23: qty 2

## 2023-09-23 MED ORDER — ACETAMINOPHEN 650 MG RE SUPP: 650.0000 mg | Freq: Four times a day (QID) | RECTAL | Status: DC | PRN

## 2023-09-23 MED ORDER — NALOXONE HCL 0.4 MG/ML IJ SOLN
0.4000 mg | Freq: Once | INTRAMUSCULAR | Status: AC
  Administered 2023-09-23: 0.4 mg via INTRAVENOUS

## 2023-09-23 MED ORDER — LACTATED RINGERS IV SOLN: INTRAVENOUS | Status: AC

## 2023-09-23 MED ORDER — NALOXONE HCL 2 MG/2ML IJ SOSY
1.0000 mg | PREFILLED_SYRINGE | INTRAMUSCULAR | Status: DC | PRN
  Administered 2023-09-23: 1 mg via INTRAVENOUS
  Filled 2023-09-23 (×2): qty 2

## 2023-09-23 MED ORDER — ACETAMINOPHEN 325 MG PO TABS
650.0000 mg | ORAL_TABLET | Freq: Four times a day (QID) | ORAL | Status: DC | PRN
  Administered 2023-09-23 (×2): 650 mg via ORAL
  Filled 2023-09-23 (×2): qty 2

## 2023-09-23 MED ORDER — ORAL CARE MOUTH RINSE: 15.0000 mL | OROMUCOSAL | Status: DC | PRN

## 2023-09-23 NOTE — ED Notes (Signed)
 Pt respirations were dropping and SpO2 would drop into 70s. Had to rouse with painful stimulation. PRN narcan  given, pt more alert now.

## 2023-09-23 NOTE — ED Notes (Addendum)
 Poison control called. Patty at poison control stated pt. Needs to be observed for 24hrs, repeat EKG q 8 hrs, repeat tylenol  level, and possible narcan  drip if needed. MD made aware.

## 2023-09-23 NOTE — H&P (Addendum)
 PCP:   Stacey Rosaline SQUIBB, NP   Chief Complaint:  Overdose  HPI: This is a 28 year old female with past medical history of polysubstance abuse.  Patient brought in for possible drug overdose.  Patient reportedly has a methadone prescription.  Patient initially agonal breathing, given 2 mg IV Narcan  with good response.  In the ER patient vitals stable.  She has been given a total of 2.4 mg IV Narcan .  Initially plans made for a Narcan  drip which patient declined.  Patient somnolent but arousable, quickly going back to sleep.  Patient is oriented and answers questions appropriately.  Poison control contacted recommendations made.  Admission requested  Review of Systems:  Unable to obtain  Past Medical History: Past Medical History:  Diagnosis Date   Genital herpes    Hx of chlamydia infection 2017   Hx of trichomoniasis 2017   Past Surgical History:  Procedure Laterality Date   CESAREAN SECTION N/A 08/21/2019   Procedure: CESAREAN SECTION;  Surgeon: Stacey Gong, MD;  Location: [facility] LD ORS;  Service: Obstetrics;  Laterality: N/A;   NO PAST SURGERIES      Medications: Prior to Admission medications   Medication Sig Start Date End Date Taking? Authorizing Provider  amoxicillin -clavulanate (AUGMENTIN ) 875-125 MG tablet Take 1 tablet by mouth every 12 (twelve) hours. 07/03/23   Rodriguez-Southworth, Sylvia, PA-C  etonogestrel  (NEXPLANON ) 68 MG IMPL implant 1 each by Subdermal route once.    [provider]  ibuprofen  (ADVIL ) 800 MG tablet Take 1 tablet (800 mg total) by mouth 3 (three) times daily. 07/03/23   Rodriguez-Southworth, Sylvia, PA-C  neomycin -polymyxin-hydrocortisone  (CORTISPORIN) 3.5-10000-1 OTIC suspension Place 3 drops into the left ear 3 (three) times daily. For 7 days 07/03/23   Rodriguez-Southworth, Kyra, PA-C  albuterol  (PROVENTIL  HFA;VENTOLIN  HFA) 108 (90 Base) MCG/ACT inhaler Inhale 1-2 puffs into the lungs every 6 (six) hours as needed for wheezing or  shortness of breath. Patient not taking: Reported on 01/29/2020 12/13/17 05/19/20  Stacey Million, MD    Allergies:  No Known Allergies  Social History:  reports that she has been smoking cigars. She has never used smokeless tobacco. She reports that she does not currently use alcohol. She reports current drug use. Drug: Marijuana.  Family History: Family History  Problem Relation Age of Onset   Asthma Mother    Healthy Father     Physical Exam: Vitals:   09/23/23 0115 09/23/23 0142 09/23/23 0230 09/23/23 0315  BP: 107/75  110/68 (!) 123/99  Pulse: (!) 101  (!) 108 92  Resp: 12  15 17   Temp:  98.3 F (36.8 C)    TempSrc:  Oral    SpO2: 99%  96% 99%    General: Alert, arousable, somnolent female Eyes: Pinpoint pupils ENT: Moist oral mucosa, neck supple, no thyromegaly Lungs: CTA B/L, no wheeze, no crackles, no use of accessory muscles Cardiovascular: Tachycardia, RRR, no murmurs. No carotid bruits, no JVD Abdomen: soft, positive BS, NTND, no organomegaly, not an acute abdomen GU: not examined Neuro: CN II - XII grossly intact, sensation intact Musculoskeletal: strength 5/5 all extremities, no edema Skin: no rash, no subcutaneous crepitation, no decubitus Psych: Somnolent patient  Labs on Admission:  Recent Labs    09/22/23 2153  NA 139  K 4.2  CL 101  CO2 23  GLUCOSE 141*  BUN 20  CREATININE 1.33*  CALCIUM 9.0   Recent Labs    09/22/23 2153  AST 37  ALT 22  ALKPHOS 51  BILITOT 0.7  PROT 8.4*  ALBUMIN 4.6    Recent Labs    09/22/23 2153  WBC 17.0*  HGB 13.9  HCT 42.1  MCV 93.8  PLT 177    Radiological Exams on Admission: DG Hip Unilat W or Wo Pelvis 2-3 Views Left Result Date: 09/22/2023 CLINICAL DATA:  Hip pain post overdose. EXAM: DG HIP (WITH OR WITHOUT PELVIS) 2-3V LEFT COMPARISON:  None Available. FINDINGS: There is no evidence of hip fracture or dislocation. There is no evidence of arthropathy or other focal bone abnormality. IMPRESSION:  Negative. Electronically Signed   By: Suzen Dials M.D.   On: 09/22/2023 23:44   CT Head Wo Contrast Result Date: 09/22/2023 CLINICAL DATA:  Altered mental status. EXAM: CT HEAD WITHOUT CONTRAST TECHNIQUE: Contiguous axial images were obtained from the base of the skull through the vertex without intravenous contrast. RADIATION DOSE REDUCTION: This exam was performed according to the departmental dose-optimization program which includes automated exposure control, adjustment of the mA and/or kV according to patient size and/or use of iterative reconstruction technique. COMPARISON:  July 03, 2014 FINDINGS: Brain: No evidence of acute infarction, hemorrhage, hydrocephalus, extra-axial collection or mass lesion/mass effect. Vascular: No hyperdense vessel or unexpected calcification. Skull: Normal. Negative for fracture or focal lesion. Sinuses/Orbits: Very mild right ethmoid sinus mucosal thickening is seen. Other: None. IMPRESSION: No acute intracranial abnormality. Electronically Signed   By: Suzen Dials M.D.   On: 09/22/2023 23:39   DG Chest Portable 1 View Result Date: 09/22/2023 CLINICAL DATA:  Altered mental status. EXAM: PORTABLE CHEST 1 VIEW COMPARISON:  February 04, 2017 FINDINGS: The heart size and mediastinal contours are within normal limits. Both lungs are clear. The visualized skeletal structures are unremarkable. IMPRESSION: No active disease. Electronically Signed   By: Suzen Dials M.D.   On: 09/22/2023 22:52    Assessment/Plan Present on Admission:  Methadone overdose, accidental //  Substance abuse (HCC) -Admit to stepdown -PRN Narcan  -IV fluid hydration -Observe 24 hours per poison control.  EKG every 8 hours, monitor QTc.  Will repeat Tylenol  every 4 hours. -Patient declines Narcan  drip   Possible UTI -Cultures collected.  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

## 2023-09-23 NOTE — Plan of Care (Signed)

## 2023-09-23 NOTE — Progress Notes (Signed)
 PROGRESS NOTE    Stacey Rodriguez  FMW:[id number] DOB: August 10, 1996 DOA: 09/22/2023 PCP: [name], NP   Brief Narrative:  Day 0 note, patient admitted earlier today by Dr. Laveda, see her note for further details.  Briefly patient admitted with methadone overdose, considered accidental per initial information.  Patient required multiple doses of Narcan  overnight but is much more awake alert and oriented today.  Initially somewhat hypoxic this morning as well but appears to be resolving now on room air with improved mental status.  Continue to follow clinically -EKG with improving Qtc -labs otherwise unremarkable other than mild white count and THC positive urine.  UTI is much less likely although notably abnormal UA it appears to be in the setting of dehydration and not true infection.  Hold further antibiotics.   Assessment & Plan:   Principal Problem:   Overdose Active Problems:   Substance abuse (HCC)   Methadone overdose, accidental or unintentional, initial encounter (HCC)  DVT prophylaxis: enoxaparin  (LOVENOX ) injection 40 mg Start: 09/23/23 1000 Code Status:   Code Status: Full Code Family Communication: None present  Status is: Inpatient  Dispo: The patient is from: Home              Anticipated d/c is to: Home              Anticipated d/c date is: 24 to 48 hours              Patient currently not medically stable for discharge  Consultants:  None  Procedures:  None  Antimicrobials:  Discontinued  Subjective: No acute issues or events this morning, feels much better requesting for something to eat and drink.  Objective: Vitals:   09/23/23 0142 09/23/23 0230 09/23/23 0315 09/23/23 0517  BP:  110/68 (!) 123/99 108/76  Pulse:  (!) 108 92 90  Resp:  15 17 14   Temp: 98.3 F (36.8 C)   98.6 F (37 C)  TempSrc: Oral   Oral  SpO2:  96% 99% 90%   No intake or output data in the 24 hours ending 09/23/23 0746 There were no vitals filed for this  visit.  Examination:  General:  Pleasantly resting in bed, No acute distress. HEENT:  Normocephalic atraumatic.  Sclerae nonicteric, noninjected.  Extraocular movements intact bilaterally. Neck:  Without mass or deformity.  Trachea is midline. Lungs:  Clear to auscultate bilaterally without rhonchi, wheeze, or rales. Heart:  Regular rate and rhythm.  Without murmurs, rubs, or gallops. Abdomen:  Soft, nontender, nondistended.  Without guarding or rebound. Extremities: Without cyanosis, clubbing, edema, or obvious deformity. Skin:  Warm and dry, no erythema.  Data Reviewed: I have personally reviewed following labs and imaging studies  CBC: Recent Labs  Lab 09/22/23 2153  WBC 17.0*  HGB 13.9  HCT 42.1  MCV 93.8  PLT 177   Basic Metabolic Panel: Recent Labs  Lab 09/22/23 2153  NA 139  K 4.2  CL 101  CO2 23  GLUCOSE 141*  BUN 20  CREATININE 1.33*  CALCIUM 9.0   GFR: CrCl cannot be calculated (Unknown ideal weight.). Liver Function Tests: Recent Labs  Lab 09/22/23 2153  AST 37  ALT 22  ALKPHOS 51  BILITOT 0.7  PROT 8.4*  ALBUMIN 4.6   No results found for this or any previous visit (from the past 240 hours).   Radiology Studies: DG Hip Unilat W or Wo Pelvis 2-3 Views Left Result Date: 09/22/2023 CLINICAL DATA:  Hip pain post overdose.  EXAM: DG HIP (WITH OR WITHOUT PELVIS) 2-3V LEFT COMPARISON:  None Available. FINDINGS: There is no evidence of hip fracture or dislocation. There is no evidence of arthropathy or other focal bone abnormality. IMPRESSION: Negative. Electronically Signed   By: Suzen Dials M.D.   On: 09/22/2023 23:44   CT Head Wo Contrast Result Date: 09/22/2023 CLINICAL DATA:  Altered mental status. EXAM: CT HEAD WITHOUT CONTRAST TECHNIQUE: Contiguous axial images were obtained from the base of the skull through the vertex without intravenous contrast. RADIATION DOSE REDUCTION: This exam was performed according to the departmental dose-optimization  program which includes automated exposure control, adjustment of the mA and/or kV according to patient size and/or use of iterative reconstruction technique. COMPARISON:  July 03, 2014 FINDINGS: Brain: No evidence of acute infarction, hemorrhage, hydrocephalus, extra-axial collection or mass lesion/mass effect. Vascular: No hyperdense vessel or unexpected calcification. Skull: Normal. Negative for fracture or focal lesion. Sinuses/Orbits: Very mild right ethmoid sinus mucosal thickening is seen. Other: None. IMPRESSION: No acute intracranial abnormality. Electronically Signed   By: Suzen Dials M.D.   On: 09/22/2023 23:39   DG Chest Portable 1 View Result Date: 09/22/2023 CLINICAL DATA:  Altered mental status. EXAM: PORTABLE CHEST 1 VIEW COMPARISON:  February 04, 2017 FINDINGS: The heart size and mediastinal contours are within normal limits. Both lungs are clear. The visualized skeletal structures are unremarkable. IMPRESSION: No active disease. Electronically Signed   By: Suzen Dials M.D.   On: 09/22/2023 22:52        Scheduled Meds:  enoxaparin  (LOVENOX ) injection  40 mg Subcutaneous Q24H   Continuous Infusions:   LOS: 0 days    Time spent:    Elsie JAYSON [place], DO [facility]  If 7PM-7AM, please contact night-coverage [email or web address]  09/23/2023, 7:46 AM

## 2023-09-24 DIAGNOSIS — T403X4A Poisoning by methadone, undetermined, initial encounter: Secondary | ICD-10-CM | POA: Diagnosis not present

## 2023-09-24 LAB — CBC WITH DIFFERENTIAL/PLATELET
Abs Immature Granulocytes: 0.03 10*3/uL (ref 0.00–0.07)
Basophils Absolute: 0 10*3/uL (ref 0.0–0.1)
Basophils Relative: 0 %
Eosinophils Absolute: 0 10*3/uL (ref 0.0–0.5)
Eosinophils Relative: 0 %
HCT: 42.1 % (ref 36.0–46.0)
Hemoglobin: 13.4 g/dL (ref 12.0–15.0)
Immature Granulocytes: 0 %
Lymphocytes Relative: 18 %
Lymphs Abs: 1.7 10*3/uL (ref 0.7–4.0)
MCH: 29.6 pg (ref 26.0–34.0)
MCHC: 31.8 g/dL (ref 30.0–36.0)
MCV: 93.1 fL (ref 80.0–100.0)
Monocytes Absolute: 1 10*3/uL (ref 0.1–1.0)
Monocytes Relative: 10 %
Neutro Abs: 6.5 10*3/uL (ref 1.7–7.7)
Neutrophils Relative %: 72 %
Platelets: 156 10*3/uL (ref 150–400)
RBC: 4.52 MIL/uL (ref 3.87–5.11)
RDW: 13.1 % (ref 11.5–15.5)
WBC: 9.2 10*3/uL (ref 4.0–10.5)
nRBC: 0 % (ref 0.0–0.2)

## 2023-09-24 LAB — COMPREHENSIVE METABOLIC PANEL
ALT: 86 U/L — ABNORMAL HIGH (ref 0–44)
AST: 223 U/L — ABNORMAL HIGH (ref 15–41)
Albumin: 4.3 g/dL (ref 3.5–5.0)
Alkaline Phosphatase: 39 U/L (ref 38–126)
Anion gap: 10 (ref 5–15)
BUN: 12 mg/dL (ref 6–20)
CO2: 28 mmol/L (ref 22–32)
Calcium: 9.1 mg/dL (ref 8.9–10.3)
Chloride: 98 mmol/L (ref 98–111)
Creatinine, Ser: 0.95 mg/dL (ref 0.44–1.00)
GFR, Estimated: >60 mL/min (ref >60)
Glucose, Bld: 112 mg/dL — ABNORMAL HIGH (ref 70–99)
Potassium: 3.8 mmol/L (ref 3.5–5.1)
Sodium: 136 mmol/L (ref 135–145)
Total Bilirubin: 0.6 mg/dL (ref 0.0–1.2)
Total Protein: 7.5 g/dL (ref 6.5–8.1)

## 2023-09-24 LAB — MAGNESIUM: Magnesium: 2.3 mg/dL (ref 1.7–2.4)

## 2023-09-24 MED ORDER — NALOXONE HCL 0.4 MG/ML IJ SOLN
0.4000 mg | INTRAMUSCULAR | Status: DC | PRN
  Administered 2023-09-24: 0.4 mg via INTRAVENOUS
  Filled 2023-09-24: qty 1

## 2023-09-24 NOTE — Progress Notes (Signed)
 AVS instructions reviewed with patient per protocol. All patient belongings, including medications stored in pharmacy, returned to patient. PIV removed. Education and care plan completed. Patient was transported downstairs, in the wheelchair, by RN where she was taken home by her sister.

## 2023-09-24 NOTE — Discharge Summary (Signed)
 Physician Discharge Summary  Stacey Rodriguez ZOX:[id number] DOB: September 17, 1995 DOA: 09/22/2023  PCP: Marius Siemens, NP  Admit date: 09/22/2023 Discharge date: 09/24/2023  Admitted From: Home Disposition:  Home  Recommendations for Outpatient Follow-up:  Follow up with PCP in 1-2 weeks Repeat EKG at next office visit  Home Health:None  Equipment/Devices:None  Discharge Condition:Stable  CODE STATUS:Full  Diet recommendation: Regular diet    Brief/Interim Summary: This is a 28 year old female with past medical history of polysubstance abuse.  Patient brought in for possible drug overdose.  Patient reportedly has a methadone prescription.  Patient initially agonal breathing, given 2 mg IV Narcan  with good response.  In the ER patient vitals stable.  She has been given a total of 2.4 mg IV Narcan .  Initially plans made for a Narcan  drip which patient declined.  Patient somnolent but arousable, quickly going back to sleep.  Patient is oriented and answers questions appropriately.  Poison control contacted recommendations made.  Admission requested   Briefly patient admitted with methadone overdose, considered accidental per initial information.  Patient required multiple doses of Narcan  overnight but is much more awake alert and oriented today, initially somewhat hypoxic but now on room air with improved mental status. EKG Qtc downtrending appropriately.   UTI is unlikely no indication for further antibiotics. Nexplanon  overdue for removal - discussed following up this week with clinic.  Discharge Diagnoses:  Principal Problem:   Overdose Active Problems:   Substance abuse (HCC)   Methadone overdose, accidental or unintentional, initial encounter (HCC)  Methadone overdose, accidental //  Substance abuse (HCC) -Discussed medication compliance and risks of taking prescription medications that are not hers   UTI ruled out -DC antibiotics as above   CKD stage II ruled  out -Creatinine within normal limits - verify prior diagnosis with PCP  Discharge Instructions   Allergies as of 09/24/2023   No Known Allergies      Medication List     TAKE these medications    ibuprofen  800 MG tablet Commonly known as: ADVIL  Take 1 tablet (800 mg total) by mouth 3 (three) times daily.   Nexplanon  68 MG Impl implant Generic drug: etonogestrel  1 each by Subdermal route once.        No Known Allergies  Consultations: None   Procedures/Studies: DG Hip Unilat W or Wo Pelvis 2-3 Views Left Result Date: 09/22/2023 CLINICAL DATA:  Hip pain post overdose. EXAM: DG HIP (WITH OR WITHOUT PELVIS) 2-3V LEFT COMPARISON:  None Available. FINDINGS: There is no evidence of hip fracture or dislocation. There is no evidence of arthropathy or other focal bone abnormality. IMPRESSION: Negative. Electronically Signed   By: Virgle Grime M.D.   On: 09/22/2023 23:44   CT Head Wo Contrast Result Date: 09/22/2023 CLINICAL DATA:  Altered mental status. EXAM: CT HEAD WITHOUT CONTRAST TECHNIQUE: Contiguous axial images were obtained from the base of the skull through the vertex without intravenous contrast. RADIATION DOSE REDUCTION: This exam was performed according to the departmental dose-optimization program which includes automated exposure control, adjustment of the mA and/or kV according to patient size and/or use of iterative reconstruction technique. COMPARISON:  July 03, 2014 FINDINGS: Brain: No evidence of acute infarction, hemorrhage, hydrocephalus, extra-axial collection or mass lesion/mass effect. Vascular: No hyperdense vessel or unexpected calcification. Skull: Normal. Negative for fracture or focal lesion. Sinuses/Orbits: Very mild right ethmoid sinus mucosal thickening is seen. Other: None. IMPRESSION: No acute intracranial abnormality. Electronically Signed   By: Virgle Grime M.D.   On:  09/22/2023 23:39   DG Chest Portable 1 View Result Date:  09/22/2023 CLINICAL DATA:  Altered mental status. EXAM: PORTABLE CHEST 1 VIEW COMPARISON:  February 04, 2017 FINDINGS: The heart size and mediastinal contours are within normal limits. Both lungs are clear. The visualized skeletal structures are unremarkable. IMPRESSION: No active disease. Electronically Signed   By: Virgle Grime M.D.   On: 09/22/2023 22:52     Subjective: No acute issue/events overnight   Discharge Exam: Vitals:   09/24/23 0700 09/24/23 0725  BP:  113/65  Pulse: 68 64  Resp: (!) 41 (!) 38  Temp:    SpO2: 93% 92%   Vitals:   09/24/23 0000 09/24/23 0355 09/24/23 0700 09/24/23 0725  BP:    113/65  Pulse:   68 64  Resp:   (!) 41 (!) 38  Temp: 98.9 F (37.2 C) 98.7 F (37.1 C)    TempSrc: Oral Oral    SpO2:   93% 92%  Weight:      Height:        General: Pt is alert, awake, not in acute distress Cardiovascular: RRR, S1/S2 +, no rubs, no gallops Respiratory: CTA bilaterally, no wheezing, no rhonchi Abdominal: Soft, NT, ND, bowel sounds + Extremities: no edema, no cyanosis    The results of significant diagnostics from this hospitalization (including imaging, microbiology, ancillary and laboratory) are listed below for reference.     Microbiology: Recent Results (from the past 240 hours)  MRSA Next Gen by PCR, Nasal     Status: None   Collection Time: 09/23/23  8:48 AM   Specimen: Nasal Mucosa; Nasal Swab  Result Value Ref Range Status   MRSA by PCR Next Gen NOT DETECTED NOT DETECTED Final    Comment: (NOTE) The GeneXpert MRSA Assay (FDA approved for NASAL specimens only), is one component of a comprehensive MRSA colonization surveillance program. It is not intended to diagnose MRSA infection nor to guide or monitor treatment for MRSA infections. Test performance is not FDA approved in patients less than 39 years old. Performed at [facility], [address] [place], [place] [address]      Labs: BNP (last 3 results) No  results for input(s): "BNP" in the last 8760 hours. Basic Metabolic Panel: Recent Labs  Lab 09/22/23 2153 09/23/23 0734 09/24/23 0252  NA 139  --  136  K 4.2  --  3.8  CL 101  --  98  CO2 23  --  28  GLUCOSE 141*  --  112*  BUN 20  --  12  CREATININE 1.33* 0.79 0.95  CALCIUM 9.0  --  9.1  MG  --   --  2.3   Liver Function Tests: Recent Labs  Lab 09/22/23 2153 09/24/23 0252  AST 37 223*  ALT 22 86*  ALKPHOS 51 39  BILITOT 0.7 0.6  PROT 8.4* 7.5  ALBUMIN 4.6 4.3   No results for input(s): "LIPASE", "AMYLASE" in the last 168 hours. No results for input(s): "AMMONIA " in the last 168 hours. CBC: Recent Labs  Lab 09/22/23 2153 09/23/23 0734 09/24/23 0252  WBC 17.0* 17.3* 9.2  NEUTROABS  --   --  6.5  HGB 13.9 13.7 13.4  HCT 42.1 41.3 42.1  MCV 93.8 92.0 93.1  PLT 177 164 156   Cardiac Enzymes: No results for input(s): "CKTOTAL", "CKMB", "CKMBINDEX", "TROPONINI" in the last 168 hours. BNP: Invalid input(s): "POCBNP" CBG: No results for input(s): "GLUCAP" in the last 168 hours.  D-Dimer No results for input(s): "DDIMER" in the last 72 hours. Hgb A1c No results for input(s): "HGBA1C" in the last 72 hours. Lipid Profile No results for input(s): "CHOL", "HDL", "LDLCALC", "TRIG", "CHOLHDL", "LDLDIRECT" in the last 72 hours. Thyroid function studies No results for input(s): "TSH", "T4TOTAL", "T3FREE", "THYROIDAB" in the last 72 hours.  Invalid input(s): "FREET3" Anemia work up No results for input(s): "VITAMINB12", "FOLATE", "FERRITIN", "TIBC", "IRON", "RETICCTPCT" in the last 72 hours. Urinalysis    Component Value Date/Time   COLORURINE YELLOW 09/22/2023 2218   APPEARANCEUR CLOUDY (A) 09/22/2023 2218   LABSPEC 1.013 09/22/2023 2218   PHURINE 5.0 09/22/2023 2218   GLUCOSEU 50 (A) 09/22/2023 2218   HGBUR LARGE (A) 09/22/2023 2218   BILIRUBINUR NEGATIVE 09/22/2023 2218   KETONESUR NEGATIVE 09/22/2023 2218   PROTEINUR 100 (A) 09/22/2023 2218   UROBILINOGEN  0.2 10/19/2021 0956   NITRITE NEGATIVE 09/22/2023 2218   LEUKOCYTESUR TRACE (A) 09/22/2023 2218   Sepsis Labs Recent Labs  Lab 09/22/23 2153 09/23/23 0734 09/24/23 0252  WBC 17.0* 17.3* 9.2   Microbiology Recent Results (from the past 240 hours)  MRSA Next Gen by PCR, Nasal     Status: None   Collection Time: 09/23/23  8:48 AM   Specimen: Nasal Mucosa; Nasal Swab  Result Value Ref Range Status   MRSA by PCR Next Gen NOT DETECTED NOT DETECTED Final    Comment: (NOTE) The GeneXpert MRSA Assay (FDA approved for NASAL specimens only), is one component of a comprehensive MRSA colonization surveillance program. It is not intended to diagnose MRSA infection nor to guide or monitor treatment for MRSA infections. Test performance is not FDA approved in patients less than 37 years old. Performed at [facility], [address], [place], [place] [address]      Time coordinating discharge: Over 30 minutes  SIGNED:   Haydee Lipa, DO [facility] 09/24/2023, 8:42 AM Pager   If 7PM-7AM, please contact night-coverage [email or web address]

## 2023-09-24 NOTE — TOC Transition Note (Signed)
 Transition of Care [facility]) - Discharge Note   Patient Details  Name: Stacey Rodriguez MRN: [id number] Date of Birth: January 20, 1996  [facility] CM/SW Contact:  Ruben Corolla, RN Phone Number: 09/24/2023, 10:14 AM   Clinical Narrative:  d/c home.Resources added to AVS.     Final next level of care: Home/Self Care Barriers to Discharge: No Barriers Identified   Patient Goals and CMS Choice   CMS Medicare.gov Compare Post Acute Care list provided to:: Patient Choice offered to / list presented to : Patient      Discharge Placement                       Discharge Plan and Services Additional resources added to the After Visit Summary for                                       Social Drivers of Health (SDOH) Interventions SDOH Screenings   Food Insecurity: No Food Insecurity (09/23/2023)  Housing: High Risk (09/23/2023)  Transportation Needs: No Transportation Needs (09/23/2023)  Utilities: Not At Risk (09/23/2023)  Depression (PHQ2-9): Low Risk  (11/25/2020)  Social Connections: Unknown (12/27/2021)   Received from [facility]  Tobacco Use: High Risk (09/22/2023)     Readmission Risk Interventions     No data to display

## 2023-09-24 NOTE — Plan of Care (Signed)
  Problem: Clinical Measurements: Goal: Respiratory complications will improve Outcome: Progressing   Problem: Coping: Goal: Level of anxiety will decrease Outcome: Progressing   Problem: Elimination: Goal: Will not experience complications related to bowel motility Outcome: Progressing   Problem: Skin Integrity: Goal: Risk for impaired skin integrity will decrease Outcome: Progressing

## 2023-09-25 ENCOUNTER — Telehealth: Payer: Self-pay

## 2023-09-25 NOTE — Transitions of Care (Post Inpatient/ED Visit) (Unsigned)
   09/25/2023  Name: Stacey Rodriguez MRN: [id number] DOB: Dec 24, 1995  Today's TOC FU Call Status: Today's TOC FU Call Status:: Successful TOC FU Call Completed TOC FU Call Complete Date: 09/25/23 Patient's Name and Date of Birth confirmed.  Transition Care Management Follow-up Telephone Call Date of Discharge: 09/24/23 Discharge Facility: [facility]) Type of Discharge: Inpatient Admission Primary Inpatient Discharge Diagnosis:: overdose How have you been since you were released from the hospital?: Better Any questions or concerns?: Yes Patient Questions/Concerns:: She is interested in resources/counseling for substance use.  she explained that she took her friend's methadone. Patient Questions/Concerns Addressed: Other: (referred to [name], LCSWA)  Items Reviewed: Did you receive and understand the discharge instructions provided?: Yes Medications obtained,verified, and reconciled?: Yes (Medications Reviewed) Any new allergies since your discharge?: No Dietary orders reviewed?: Yes Type of Diet Ordered:: regular Do you have support at home?: Yes People in Home: alone Name of Support/Comfort Primary Source: She said that her sister and brother check on her  Medications Reviewed Today: Medications Reviewed Today     Reviewed by Stacey Peers, RN (Case Manager) on 09/25/23 at 1158  Med List Status: <None>   Medication Order Taking? Sig Documenting Provider Last Dose Status Informant  Patient not taking:  Discontinued 05/19/20 1246 etonogestrel (NEXPLANON) 68 MG IMPL implant [id number]  1 each by Subdermal route once. [provider]  Active Self, Pharmacy Records           Med Note Stacey Rodriguez, [facility]acey [facility]   Sun Sep 23, 2023 11:06 AM) Pt stated that it's time to be taken out soon.  ibuprofen (ADVIL) 800 MG tablet [id number] No Take 1 tablet (800 mg total) by mouth 3 (three) times daily. Stacey Rodriguez, Stacey Elm, PA-C Past Week Active Self, Pharmacy Records             Home Care and Equipment/Supplies: Were Home Health Services Ordered?: No Any new equipment or medical supplies ordered?: No  Functional Questionnaire: Do you need assistance with bathing/showering or dressing?: No Do you need assistance with meal preparation?: No Do you need assistance with eating?: No Do you have difficulty maintaining continence: No Do you need assistance with getting out of bed/getting out of a chair/moving?: No (She said her left left hip/ leg are painful when she moves, ambulates. She said the providers at the hospital told her it was due to sleeping on that side for a long time.  She explained that her sister found her asleep after the methadone overdose.) Do you have difficulty managing or taking your medications?: No  Follow up appointments reviewed: PCP Follow-up appointment confirmed?: Yes Date of PCP follow-up appointment?: 10/02/23 Follow-up Provider: Gwinda Passe, NP [facility] Follow-up appointment confirmed?: NA Do you need transportation to your follow-up appointment?: No Do you understand care options if your condition(s) worsen?: Yes-patient verbalized understanding    SIGNATURE Stacey Peers, RN

## 2023-09-26 ENCOUNTER — Telehealth (INDEPENDENT_AMBULATORY_CARE_PROVIDER_SITE_OTHER): Payer: Self-pay | Admitting: Licensed Clinical Social Worker

## 2023-09-26 ENCOUNTER — Encounter (INDEPENDENT_AMBULATORY_CARE_PROVIDER_SITE_OTHER): Payer: Self-pay

## 2023-09-26 NOTE — Telephone Encounter (Signed)
LCSWA called patient today to introduce herself and to assess patients' mental health needs. Patient did answer the phone. LCSWA was able Speak with the patient briefly about her need for community resources. LCSWA let the patient know she will receive these resources via my chart . Patient was referred by PCP for community resources for substance use.

## 2023-10-01 ENCOUNTER — Telehealth (INDEPENDENT_AMBULATORY_CARE_PROVIDER_SITE_OTHER): Payer: Self-pay | Admitting: Primary Care

## 2023-10-01 NOTE — Telephone Encounter (Signed)
Called pt to remind them about atp.

## 2023-10-02 ENCOUNTER — Inpatient Hospital Stay (INDEPENDENT_AMBULATORY_CARE_PROVIDER_SITE_OTHER): Payer: MEDICAID | Admitting: Primary Care

## 2023-10-10 ENCOUNTER — Telehealth: Payer: Self-pay | Admitting: Licensed Clinical Social Worker

## 2023-10-10 NOTE — Telephone Encounter (Signed)
 I contacted patient on the 12th and provided her with some resources.  I followed up with her today and she stated she was doing much better and was receiving help, but her conversation was very limited she reported not using a plan to continue to follow her.  Just wanted to share

## 2023-12-25 ENCOUNTER — Encounter (HOSPITAL_COMMUNITY): Payer: Self-pay | Admitting: *Deleted

## 2023-12-25 ENCOUNTER — Ambulatory Visit (HOSPITAL_COMMUNITY)
Admission: EM | Admit: 2023-12-25 | Discharge: 2023-12-25 | Disposition: A | Discharge status: Home / Self Care | Payer: MEDICAID | Attending: Physician Assistant | Admitting: Physician Assistant

## 2023-12-25 DIAGNOSIS — Z113 Encounter for screening for infections with a predominantly sexual mode of transmission: Secondary | ICD-10-CM | POA: Insufficient documentation

## 2023-12-25 DIAGNOSIS — N898 Other specified noninflammatory disorders of vagina: Secondary | ICD-10-CM | POA: Insufficient documentation

## 2023-12-25 DIAGNOSIS — B009 Herpesviral infection, unspecified: Secondary | ICD-10-CM | POA: Insufficient documentation

## 2023-12-25 LAB — HIV ANTIBODY (ROUTINE TESTING W REFLEX): HIV Screen 4th Generation wRfx: NONREACTIVE

## 2023-12-25 LAB — HEPATITIS C ANTIBODY: HCV Ab: NONREACTIVE

## 2023-12-25 LAB — RPR: RPR Ser Ql: NONREACTIVE

## 2023-12-25 MED ORDER — VALACYCLOVIR HCL 500 MG PO TABS: 500.0000 mg | ORAL_TABLET | Freq: Two times a day (BID) | ORAL | 0 refills | Status: AC

## 2023-12-25 NOTE — Discharge Instructions (Signed)
 Start Valtrex  twice daily for 3 days.  We will contact you if any intervention additional treatment based on your testing.  Monitor your MyChart for your results as we will only call you if there is something positive and we need to arrange treatment.  Use a condom with each sexual encounter.  If you develop any pelvic pain, abdominal pain, fever, nausea, vomiting you should be seen immediately.  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  If anything worsens please return for reevaluation.

## 2023-12-25 NOTE — ED Triage Notes (Signed)
 Pt states she feels like she is having a herpes outbreak she has vaginal irritation X 3 days. She would like STI testing, cyto and blood work today.

## 2023-12-25 NOTE — ED Provider Notes (Signed)
 [facility]    CSN: [id number] Arrival date & time: 12/25/23  0940      History   Chief Complaint Chief Complaint  Patient presents with   SEXUALLY TRANSMITTED DISEASE    HPI Stacey Rodriguez is a 28 y.o. female.   Patient presents today with a 3-day history of vaginal irritation.  She does report some vaginal discharge but is also experiencing some irritation of the skin and is concerned that she might have a herpes outbreak.  She reports that she was recently treated for BV and herpes with Valtrex  and metronidazole  and completed course of medication a few days ago.  She is sexually active and does not always use condoms.  She is not experiencing any pelvic pain, abdominal pain, fever, nausea, vomiting.  She has never been on suppressive therapy for herpes.  She is requesting a complete STI panel as her ex boyfriend's significant other called and said that she should be tested but did not tell her what specifically she should be concerned about.  She has no concern for pregnancy as she has a Nexplanon .    Past Medical History:  Diagnosis Date   Genital herpes    Hx of chlamydia infection 2017   Hx of trichomoniasis 2017    Patient Active Problem List   Diagnosis Date Noted   Overdose 09/23/2023   Methadone overdose, accidental or unintentional, initial encounter (HCC) 09/23/2023   [redacted] weeks gestation of pregnancy 08/21/2019   Indication for care in labor and delivery, antepartum 08/20/2019   GBS bacteriuria 07/25/2019   GBS (group B Streptococcus carrier), +RV culture, currently pregnant 07/22/2019   Alpha thalassemia silent carrier 05/15/2019   Sickle cell trait (HCC) 05/15/2019   Genital herpes affecting pregnancy 05/15/2019   Supervision of other normal pregnancy, antepartum 01/27/2019   Substance abuse (HCC) 11/11/2015   Adjustment disorder with mixed anxiety and depressed mood 11/11/2015   ECZEMA 05/27/2008    Past Surgical History:  Procedure  Laterality Date   CESAREAN SECTION N/A 08/21/2019   Procedure: CESAREAN SECTION;  Surgeon: Verlyn Goad, MD;  Location: [facility];  Service: Obstetrics;  Laterality: N/A;   NO PAST SURGERIES      OB History     Gravida  1   Para  1   Term  1   Preterm      AB      Living  1      SAB      IAB      Ectopic      Multiple  0   Live Births  1            Home Medications    Prior to Admission medications   Medication Sig Start Date End Date Taking? Authorizing Provider  valACYclovir  (VALTREX ) 500 MG tablet Take 1 tablet (500 mg total) by mouth 2 (two) times daily for 3 days. 12/25/23 12/28/23 Yes Santresa Levett K, PA-C  etonogestrel  (NEXPLANON ) 68 MG IMPL implant 1 each by Subdermal route once.    [provider]  ibuprofen  (ADVIL ) 800 MG tablet Take 1 tablet (800 mg total) by mouth 3 (three) times daily. 07/03/23   Rodriguez-Southworth, Sylvia, PA-C  albuterol  (PROVENTIL  HFA;VENTOLIN  HFA) 108 (90 Base) MCG/ACT inhaler Inhale 1-2 puffs into the lungs every 6 (six) hours as needed for wheezing or shortness of breath. Patient not taking: Reported on 01/29/2020 12/13/17 05/19/20  Dain Drown, MD    Family History Family History  Problem Relation Age  of Onset   Asthma Mother    Healthy Father     Social History Social History   Tobacco Use   Smoking status: Every Day    Types: Cigars   Smokeless tobacco: Never  Vaping Use   Vaping status: Never Used  Substance Use Topics   Alcohol use: Not Currently    Comment: ocassionally   Drug use: Yes    Types: Marijuana    Comment: Percocet abuse     Allergies   Patient has no known allergies.   Review of Systems Review of Systems  Constitutional:  Positive for activity change. Negative for appetite change, fatigue and fever.  Gastrointestinal:  Negative for abdominal pain, diarrhea, nausea and vomiting.  Genitourinary:  Positive for genital sores, vaginal discharge and vaginal pain. Negative for  dysuria, frequency, pelvic pain, urgency and vaginal bleeding.     Physical Exam Triage Vital Signs ED Triage Vitals  Encounter Vitals Group     BP 12/25/23 0958 97/66     Systolic BP Percentile --      Diastolic BP Percentile --      Pulse Rate 12/25/23 0958 98     Resp 12/25/23 0958 16     Temp 12/25/23 0958 98.8 F (37.1 C)     Temp Source 12/25/23 0958 Oral     SpO2 12/25/23 0958 97 %     Weight --      Height --      Head Circumference --      Peak Flow --      Pain Score 12/25/23 0957 0     Pain Loc --      Pain Education --      Exclude from Growth Chart --    No data found.  Updated Vital Signs BP 97/66 (BP Location: Right Arm)   Pulse 98   Temp 98.8 F (37.1 C) (Oral)   Resp 16   SpO2 97%   Visual Acuity Right Eye Distance:   Left Eye Distance:   Bilateral Distance:    Right Eye Near:   Left Eye Near:    Bilateral Near:     Physical Exam Vitals reviewed. Exam conducted with a chaperone present.  Constitutional:      General: She is awake. She is not in acute distress.    Appearance: Normal appearance. She is well-developed. She is not ill-appearing.     Comments: Very pleasant female appears stated age in no acute distress sitting comfortably in exam room  HENT:     Head: Normocephalic and atraumatic.  Cardiovascular:     Rate and Rhythm: Normal rate and regular rhythm.     Heart sounds: Normal heart sounds, S1 normal and S2 normal. No murmur heard. Pulmonary:     Effort: Pulmonary effort is normal.     Breath sounds: Normal breath sounds. No wheezing, rhonchi or rales.     Comments: Clear to auscultation bilaterally Abdominal:     Palpations: Abdomen is soft.     Tenderness: There is no abdominal tenderness. There is no right CVA tenderness, left CVA tenderness, guarding or rebound.     Comments: Benign abdominal exam  Genitourinary:    Labia:        Right: No rash or tenderness.        Left: No rash or tenderness.      Vagina: Vaginal  discharge present. No tenderness.     Cervix: Normal.     Uterus: Normal.  Adnexa: Right adnexa normal and left adnexa normal.       Right: No mass or tenderness.         Left: No mass or tenderness.       Comments: Louanna Rouse, RN present as chaperone during exam.  Small skin tear/fissure noted inferior introitus without associated vesicles or ulcerations.  Copious thin white discharge noted posterior vaginal vault.  No CMT tenderness or adnexal tenderness on exam. Psychiatric:        Behavior: Behavior is cooperative.      UC Treatments / Results  Labs (all labs ordered are listed, but only abnormal results are displayed) Labs Reviewed  RPR  HIV ANTIBODY (ROUTINE TESTING W REFLEX)  HEPATITIS C ANTIBODY  CERVICOVAGINAL ANCILLARY ONLY    EKG   Radiology No results found.  Procedures Procedures (including critical care time)  Medications Ordered in UC Medications - No data to display  Initial Impression / Assessment and Plan / UC Course  I have reviewed the triage vital signs and the nursing notes.  Pertinent labs & imaging results that were available during my care of the patient were reviewed by me and considered in my medical decision making (see chart for details).     Patient is well-appearing, afebrile, nontoxic, nontachycardic.  Vital signs and physical exam are reassuring with no indication for emergent evaluation or imaging.  She does have several skin lesions concerning for fissures versus HSV outbreak.  Will start Valtrex  500 mg twice daily for 3 days.  We discussed that if she has recurrent outbreaks it may be worthwhile to consider suppressive therapy and she can follow-up with her OB/GYN/PCP to discuss if this is appropriate.  Complete STI panel was obtained and is pending.  She did have some vaginal discharge so BV and yeast were added to swab but we will defer treatment until her results are available.  She was encouraged to use hypoallergenic soaps and  detergents and wear loosefitting cotton underwear.  We discussed that if anything worsens and she has abdominal pain, pelvic pain, fever, nausea, vomiting she needs to be seen immediately.  Strict return precautions given.  Excuse note provided.  Final Clinical Impressions(s) / UC Diagnoses   Final diagnoses:  Vaginal discharge  Vaginal irritation  HSV (herpes simplex virus) infection  Screening examination for STI     Discharge Instructions      Start Valtrex  twice daily for 3 days.  We will contact you if any intervention additional treatment based on your testing.  Monitor your MyChart for your results as we will only call you if there is something positive and we need to arrange treatment.  Use a condom with each sexual encounter.  If you develop any pelvic pain, abdominal pain, fever, nausea, vomiting you should be seen immediately.  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  If anything worsens please return for reevaluation.   ED Prescriptions     Medication Sig Dispense Auth. Provider   valACYclovir  (VALTREX ) 500 MG tablet Take 1 tablet (500 mg total) by mouth 2 (two) times daily for 3 days. 6 tablet Yazlyn Wentzel K, PA-C      PDMP not reviewed this encounter.   Budd Cargo, PA-C 12/25/23 1023

## 2023-12-26 ENCOUNTER — Ambulatory Visit (HOSPITAL_COMMUNITY): Payer: Self-pay

## 2023-12-26 LAB — CERVICOVAGINAL ANCILLARY ONLY
Bacterial Vaginitis (gardnerella): POSITIVE — AB
Candida Glabrata: NEGATIVE
Candida Vaginitis: NEGATIVE
Chlamydia: NEGATIVE
Comment: NEGATIVE
Comment: NEGATIVE
Comment: NEGATIVE
Comment: NEGATIVE
Comment: NEGATIVE
Comment: NORMAL
Neisseria Gonorrhea: NEGATIVE
Trichomonas: NEGATIVE

## 2023-12-26 MED ORDER — METRONIDAZOLE 500 MG PO TABS: 500.0000 mg | ORAL_TABLET | Freq: Two times a day (BID) | ORAL | 0 refills | Status: AC

## 2024-01-30 ENCOUNTER — Emergency Department (HOSPITAL_COMMUNITY)
Admission: EM | Admit: 2024-01-30 | Discharge: 2024-01-30 | Disposition: A | Discharge status: Home / Self Care | Payer: MEDICAID | Attending: Emergency Medicine | Admitting: Emergency Medicine

## 2024-01-30 ENCOUNTER — Other Ambulatory Visit: Payer: Self-pay

## 2024-01-30 DIAGNOSIS — F419 Anxiety disorder, unspecified: Secondary | ICD-10-CM | POA: Insufficient documentation

## 2024-01-30 DIAGNOSIS — Z5329 Procedure and treatment not carried out because of patient's decision for other reasons: Secondary | ICD-10-CM | POA: Diagnosis not present

## 2024-01-30 DIAGNOSIS — F1123 Opioid dependence with withdrawal: Secondary | ICD-10-CM | POA: Insufficient documentation

## 2024-01-30 DIAGNOSIS — F1193 Opioid use, unspecified with withdrawal: Secondary | ICD-10-CM

## 2024-01-30 DIAGNOSIS — Z5321 Procedure and treatment not carried out due to patient leaving prior to being seen by health care provider: Secondary | ICD-10-CM

## 2024-01-30 LAB — CBC
HCT: 38.2 % (ref 36.0–46.0)
Hemoglobin: 13.3 g/dL (ref 12.0–15.0)
MCH: 30.6 pg (ref 26.0–34.0)
MCHC: 34.8 g/dL (ref 30.0–36.0)
MCV: 87.8 fL (ref 80.0–100.0)
Platelets: 180 10*3/uL (ref 150–400)
RBC: 4.35 MIL/uL (ref 3.87–5.11)
RDW: 12.7 % (ref 11.5–15.5)
WBC: 8.7 10*3/uL (ref 4.0–10.5)
nRBC: 0 % (ref 0.0–0.2)

## 2024-01-30 LAB — ETHANOL: Alcohol, Ethyl (B): <15 mg/dL (ref <15)

## 2024-01-30 LAB — HCG, SERUM, QUALITATIVE: Preg, Serum: NEGATIVE

## 2024-01-30 MED ORDER — DICYCLOMINE HCL 10 MG PO CAPS
10.0000 mg | ORAL_CAPSULE | Freq: Once | ORAL | Status: AC
  Administered 2024-01-30: 10 mg via ORAL
  Filled 2024-01-30: qty 1

## 2024-01-30 MED ORDER — CLONIDINE HCL 0.1 MG PO TABS
0.1000 mg | ORAL_TABLET | Freq: Once | ORAL | Status: AC
  Administered 2024-01-30: 0.1 mg via ORAL
  Filled 2024-01-30: qty 1

## 2024-01-30 MED ORDER — ACETAMINOPHEN 325 MG PO TABS
650.0000 mg | ORAL_TABLET | Freq: Once | ORAL | Status: AC
  Administered 2024-01-30: 650 mg via ORAL
  Filled 2024-01-30: qty 2

## 2024-01-30 MED ORDER — SODIUM CHLORIDE 0.9 % IV BOLUS
1000.0000 mL | Freq: Once | INTRAVENOUS | Status: AC
  Administered 2024-01-30: 1000 mL via INTRAVENOUS

## 2024-01-30 MED ORDER — ONDANSETRON HCL 4 MG/2ML IJ SOLN
4.0000 mg | Freq: Once | INTRAMUSCULAR | Status: AC
  Administered 2024-01-30: 4 mg via INTRAVENOUS
  Filled 2024-01-30: qty 2

## 2024-01-30 NOTE — ED Triage Notes (Signed)
 Patient to ED by EMS for withdrawals. Per EMS patient takes Percocet 10 mg 10 x a daily totaling 100 mg daily and she has not had any in 2 days. She c/o anxiety, N/V and poor appetite.  120/70 70 96% RA 70 CBG: 151

## 2024-01-30 NOTE — ED Provider Notes (Signed)
 [place] EMERGENCY DEPARTMENT AT [facility] Provider Note   CSN: [id number] Arrival date & time: 01/30/24  0830     Patient presents with: Withdrawal   Stacey Rodriguez is a 28 y.o. female.   Patient is a 28 year old female with a past medical history of opioid use disorder presenting to the emergency department with concern for opioid withdrawal.  Patient states that she normally uses 10 Percocets a day and has not had any in the last 2 days.  She states that she has been nauseous, vomiting and having diarrhea.  States that she feels anxious.  States that she is having abdominal cramping.  She denies any fevers or chills, body aches, rhinorrhea or sweats.  She denies any dysuria or hematuria.  She denies any other drug or alcohol use.  The history is provided by the patient.       Prior to Admission medications   Medication Sig Start Date End Date Taking? Authorizing Provider  etonogestrel  (NEXPLANON ) 68 MG IMPL implant 1 each by Subdermal route once.    [provider]  ibuprofen  (ADVIL ) 800 MG tablet Take 1 tablet (800 mg total) by mouth 3 (three) times daily. 07/03/23   Rodriguez-Southworth, Sylvia, PA-C  albuterol  (PROVENTIL  HFA;VENTOLIN  HFA) 108 (90 Base) MCG/ACT inhaler Inhale 1-2 puffs into the lungs every 6 (six) hours as needed for wheezing or shortness of breath. Patient not taking: Reported on 01/29/2020 12/13/17 05/19/20  Dain Drown, MD    Allergies: Patient has no known allergies.    Review of Systems  Updated Vital Signs BP 123/83 (BP Location: Right Arm)   Pulse 76   Temp 99.1 F (37.3 C) (Oral)   Resp 20   Ht 5' 8 (1.727 m)   Wt 65.8 kg   LMP  (LMP Unknown)   SpO2 100%   BMI 22.05 kg/m   Physical Exam Vitals and nursing note reviewed. Exam conducted with a chaperone present.  Constitutional:      Appearance: Normal appearance.     Comments: Laying naked in bed on her abdomen  HENT:     Head: Normocephalic and  atraumatic.     Nose: Nose normal.     Mouth/Throat:     Mouth: Mucous membranes are moist.     Pharynx: Oropharynx is clear.   Eyes:     Extraocular Movements: Extraocular movements intact.     Conjunctiva/sclera: Conjunctivae normal.     Pupils: Pupils are equal, round, and reactive to light.     Comments: Pupils ~24mm, appropriate for light in the room   Cardiovascular:     Rate and Rhythm: Normal rate and regular rhythm.     Heart sounds: Normal heart sounds.  Pulmonary:     Effort: Pulmonary effort is normal.     Breath sounds: Normal breath sounds.  Abdominal:     General: Abdomen is flat.     Palpations: Abdomen is soft.     Tenderness: There is no abdominal tenderness.   Musculoskeletal:        General: Normal range of motion.     Cervical back: Normal range of motion.   Skin:    General: Skin is warm and dry.   Neurological:     General: No focal deficit present.     Mental Status: She is alert and oriented to person, place, and time.   Psychiatric:        Mood and Affect: Mood normal.  Behavior: Behavior normal.     Comments: Calm, laying still in bed     (all labs ordered are listed, but only abnormal results are displayed) Labs Reviewed  ETHANOL  CBC  HCG, SERUM, QUALITATIVE  RAPID URINE DRUG SCREEN, HOSP PERFORMED  COMPREHENSIVE METABOLIC PANEL WITH GFR    EKG: EKG Interpretation Date/Time:  Wednesday January 30 2024 09:07:52 EDT Ventricular Rate:  64 PR Interval:  137 QRS Duration:  79 QT Interval:  434 QTC Calculation: 448 R Axis:   57  Text Interpretation: Sinus rhythm No significant change since last tracing Confirmed by Celesta Coke ([id number]) on 01/30/2024 9:09:18 AM  Radiology: No results found.   Procedures   Medications Ordered in the ED  ondansetron  (ZOFRAN ) injection 4 mg (4 mg Intravenous Given 01/30/24 0920)  dicyclomine  (BENTYL ) capsule 10 mg (10 mg Oral Given 01/30/24 0920)  acetaminophen  (TYLENOL ) tablet 650 mg (650  mg Oral Given 01/30/24 0919)  cloNIDine  (CATAPRES ) tablet 0.1 mg (0.1 mg Oral Given 01/30/24 0920)  sodium chloride  0.9 % bolus 1,000 mL (1,000 mLs Intravenous New Bag/Given 01/30/24 0921)    Clinical Course as of 01/30/24 1042  Wed Jan 30, 2024  1041 RN reported to me that the patient eloped prior to completion of her work up. Was able to remove her IV before she left. [VK]    Clinical Course User Index [VK] Kingsley, Jasira Robinson K, DO                                 Medical Decision Making This patient presents to the ED with chief complaint(s) of opioid withdrawal with pertinent past medical history of opioid use disorder which further complicates the presenting complaint. The complaint involves an extensive differential diagnosis and also carries with it a high risk of complications and morbidity.    The differential diagnosis includes opioid withdrawal, viral syndrome, gastroenteritis, gastritis, GERD, dehydration, electrolyte abnormality  Additional history obtained: Additional history obtained from N/A Records reviewed previous admission documents  ED Course and Reassessment: On patient's arrival she is hemodynamically stable in no acute distress.  Patient's COWS score on arrival is 4 without signs of severe withdrawal we will not do Suboxone at this time.  The patient will be given symptomatic treatment as well have labs to evaluate for alternative etiology of her symptoms and she will be closely reassessed.  Independent labs interpretation:  The following labs were independently interpreted: CBC normal, labs otherwise pending  Independent visualization of imaging: -N/A  Consultation: - Consulted or discussed management/test interpretation w/ external professional: N/A  Consideration for admission or further workup: planned for additional lab evaluation and reassessment but eloped prior to completion of work up Social Determinants of health: opioid use disorder    Amount  and/or Complexity of Data Reviewed Labs: ordered.  Risk OTC drugs. Prescription drug management.       Final diagnoses:  Opiate withdrawal [facility])  Eloped from emergency department    ED Discharge Orders     None          Kingsley, Brindley Madarang K, [place] 01/30/24 1042

## 2024-01-30 NOTE — ED Notes (Signed)
Patient aware urine sample is needed.  

## 2024-01-30 NOTE — ED Notes (Signed)
 Patient voiced that she wants a shower has not showered in days RN provided hygiene necessities and clothes for patient allowing her to shower in TCU under NT supervision.

## 2024-01-30 NOTE — ED Notes (Signed)
 Patient walking down hallway looking for exit RN addressed patient patient informed me she had called a ride and was leaving, RN was able to remove IV prior to leaving, RN made provider aware provider disposition patient as elopement

## 2024-01-30 NOTE — ED Notes (Signed)
 Per lab, light green tube needs to be recollected.   Primary staff and phlebotomy made aware.

## 2024-01-30 NOTE — ED Notes (Signed)
 Patient in tcu taking a shower currently

## 2024-02-03 ENCOUNTER — Emergency Department (HOSPITAL_COMMUNITY)
Admission: EM | Admit: 2024-02-03 | Discharge: 2024-02-03 | Disposition: A | Discharge status: Home / Self Care | Payer: MEDICAID | Attending: Emergency Medicine | Admitting: Emergency Medicine

## 2024-02-03 ENCOUNTER — Other Ambulatory Visit: Payer: Self-pay

## 2024-02-03 ENCOUNTER — Emergency Department (HOSPITAL_COMMUNITY): Payer: MEDICAID

## 2024-02-03 ENCOUNTER — Encounter (HOSPITAL_COMMUNITY): Payer: Self-pay

## 2024-02-03 DIAGNOSIS — R1084 Generalized abdominal pain: Secondary | ICD-10-CM | POA: Insufficient documentation

## 2024-02-03 DIAGNOSIS — R197 Diarrhea, unspecified: Secondary | ICD-10-CM | POA: Diagnosis not present

## 2024-02-03 DIAGNOSIS — E860 Dehydration: Secondary | ICD-10-CM | POA: Diagnosis not present

## 2024-02-03 DIAGNOSIS — R112 Nausea with vomiting, unspecified: Secondary | ICD-10-CM | POA: Insufficient documentation

## 2024-02-03 LAB — URINALYSIS, ROUTINE W REFLEX MICROSCOPIC
Bacteria, UA: NONE SEEN
Bilirubin Urine: NEGATIVE
Glucose, UA: NEGATIVE mg/dL
Hgb urine dipstick: NEGATIVE
Ketones, ur: NEGATIVE mg/dL
Leukocytes,Ua: NEGATIVE
Nitrite: NEGATIVE
Protein, ur: 100 mg/dL — AB
Specific Gravity, Urine: 1.019 (ref 1.005–1.030)
pH: 6 (ref 5.0–8.0)

## 2024-02-03 LAB — CBC
HCT: 49 % — ABNORMAL HIGH (ref 36.0–46.0)
Hemoglobin: 17.9 g/dL — ABNORMAL HIGH (ref 12.0–15.0)
MCH: 30.7 pg (ref 26.0–34.0)
MCHC: 36.5 g/dL — ABNORMAL HIGH (ref 30.0–36.0)
MCV: 83.9 fL (ref 80.0–100.0)
Platelets: 200 10*3/uL (ref 150–400)
RBC: 5.84 MIL/uL — ABNORMAL HIGH (ref 3.87–5.11)
RDW: 11.7 % (ref 11.5–15.5)
WBC: 8.6 10*3/uL (ref 4.0–10.5)
nRBC: 0 % (ref 0.0–0.2)

## 2024-02-03 LAB — LIPASE, BLOOD: Lipase: 29 U/L (ref 11–51)

## 2024-02-03 LAB — COMPREHENSIVE METABOLIC PANEL WITH GFR
ALT: 42 U/L (ref 0–44)
AST: 31 U/L (ref 15–41)
Albumin: 5.2 g/dL — ABNORMAL HIGH (ref 3.5–5.0)
Alkaline Phosphatase: 57 U/L (ref 38–126)
Anion gap: 15 (ref 5–15)
BUN: 33 mg/dL — ABNORMAL HIGH (ref 6–20)
CO2: 21 mmol/L — ABNORMAL LOW (ref 22–32)
Calcium: 9.7 mg/dL (ref 8.9–10.3)
Chloride: 92 mmol/L — ABNORMAL LOW (ref 98–111)
Creatinine, Ser: 1 mg/dL (ref 0.44–1.00)
GFR, Estimated: >60 mL/min (ref >60)
Glucose, Bld: 130 mg/dL — ABNORMAL HIGH (ref 70–99)
Potassium: 3 mmol/L — ABNORMAL LOW (ref 3.5–5.1)
Sodium: 128 mmol/L — ABNORMAL LOW (ref 135–145)
Total Bilirubin: 1.9 mg/dL — ABNORMAL HIGH (ref 0.0–1.2)
Total Protein: 9.3 g/dL — ABNORMAL HIGH (ref 6.5–8.1)

## 2024-02-03 LAB — HCG, SERUM, QUALITATIVE: Preg, Serum: NEGATIVE

## 2024-02-03 MED ORDER — IOHEXOL 300 MG/ML  SOLN
100.0000 mL | Freq: Once | INTRAMUSCULAR | Status: AC | PRN
  Administered 2024-02-03: 100 mL via INTRAVENOUS

## 2024-02-03 MED ORDER — ONDANSETRON HCL 4 MG PO TABS: 4.0000 mg | ORAL_TABLET | Freq: Four times a day (QID) | ORAL | 0 refills | Status: AC

## 2024-02-03 MED ORDER — DICYCLOMINE HCL 10 MG PO CAPS
10.0000 mg | ORAL_CAPSULE | Freq: Once | ORAL | Status: AC
  Administered 2024-02-03: 10 mg via ORAL
  Filled 2024-02-03: qty 1

## 2024-02-03 MED ORDER — METOCLOPRAMIDE HCL 5 MG/ML IJ SOLN
5.0000 mg | Freq: Once | INTRAMUSCULAR | Status: AC
  Administered 2024-02-03: 5 mg via INTRAVENOUS
  Filled 2024-02-03: qty 2

## 2024-02-03 MED ORDER — ONDANSETRON HCL 4 MG/2ML IJ SOLN
4.0000 mg | Freq: Once | INTRAMUSCULAR | Status: AC
  Administered 2024-02-03: 4 mg via INTRAVENOUS
  Filled 2024-02-03: qty 2

## 2024-02-03 MED ORDER — MORPHINE SULFATE (PF) 4 MG/ML IV SOLN
4.0000 mg | Freq: Once | INTRAVENOUS | Status: AC
  Administered 2024-02-03: 4 mg via INTRAVENOUS
  Filled 2024-02-03: qty 1

## 2024-02-03 MED ORDER — SODIUM CHLORIDE 0.9 % IV BOLUS
1000.0000 mL | Freq: Once | INTRAVENOUS | Status: AC
  Administered 2024-02-03: 1000 mL via INTRAVENOUS

## 2024-02-03 MED ORDER — POTASSIUM CHLORIDE CRYS ER 20 MEQ PO TBCR
40.0000 meq | EXTENDED_RELEASE_TABLET | Freq: Once | ORAL | Status: AC
  Administered 2024-02-03: 40 meq via ORAL
  Filled 2024-02-03: qty 2

## 2024-02-03 NOTE — Discharge Instructions (Signed)
 You have been seen and discharged from the emergency department.  You were found to have dehydration with mildly low sodium and potassium.  You were treated with IV medicine and repletion of electrolytes.  The CT scan did not show any abnormal finding.  You may continue to use nausea medicine as needed.  Your symptoms may be related to every day marijuana use that can result in cyclic vomiting syndrome.  Follow-up with your primary provider for further evaluation and further care.  You may also follow-up with gastroenterology if this continues to be ongoing.  Take home medications as prescribed. If you have any worsening symptoms or further concerns for your health please return to an emergency department for further evaluation.

## 2024-02-03 NOTE — ED Notes (Signed)
 Pt opened door and preceded to lay herself down on the floor in the middle of the doorway.

## 2024-02-03 NOTE — ED Provider Notes (Signed)
 [place] EMERGENCY DEPARTMENT AT [facility] Provider Note   CSN: [id number] Arrival date & time: 02/03/24  1204     Patient presents with: Abdominal Pain and Emesis   Stacey Rodriguez is a 28 y.o. female.   HPI   28 year old female presents to the emergency department with concern for abdominal pain, nausea/vomiting/diarrhea.  Patient states the abdominal pain has been going on for the last 6 days.  Initially there was nausea with nonbloody emesis/stools but the symptoms have subsided over the last 2 days.  She is been tolerating liquid but no food.  She was seen recently with similar symptoms that was attributed to possible opiate withdrawal.  She denies continuing any daily opiate use.  She does admit to daily THC use.  Last use being earlier today.  States she has never had any issues with cyclic vomiting but her dad was an everyday [facility] user and suffered from the symptoms.  Denies any fever, chest pain, difficulty breathing, genitourinary symptoms.   Prior to Admission medications   Medication Sig Start Date End Date Taking? Authorizing Provider  etonogestrel  (NEXPLANON ) 68 MG IMPL implant 1 each by Subdermal route once.    [provider]  ibuprofen  (ADVIL ) 800 MG tablet Take 1 tablet (800 mg total) by mouth 3 (three) times daily. 07/03/23   Rodriguez-Southworth, Sylvia, PA-C  albuterol  (PROVENTIL  HFA;VENTOLIN  HFA) 108 (90 Base) MCG/ACT inhaler Inhale 1-2 puffs into the lungs every 6 (six) hours as needed for wheezing or shortness of breath. Patient not taking: Reported on 01/29/2020 12/13/17 05/19/20  Mario Million, MD    Allergies: Patient has no known allergies.    Review of Systems  Constitutional:  Positive for appetite change and fatigue. Negative for fever.  Respiratory:  Negative for shortness of breath.   Cardiovascular:  Negative for chest pain.  Gastrointestinal:  Positive for abdominal pain, diarrhea, nausea and vomiting.  Skin:  Negative for  rash.  Neurological:  Negative for headaches.    Updated Vital Signs BP 112/85 (BP Location: Right Arm)   Pulse (!) 124   Temp 98.2 F (36.8 C) (Oral)   Resp 18   Ht 5' 8 (1.727 m)   Wt 63.5 kg   LMP  (LMP Unknown)   SpO2 100%   BMI 21.29 kg/m   Physical Exam Vitals and nursing note reviewed.  Constitutional:      Appearance: Normal appearance. She is ill-appearing.  HENT:     Head: Normocephalic.     Mouth/Throat:     Mouth: Mucous membranes are moist.   Cardiovascular:     Rate and Rhythm: Normal rate.  Pulmonary:     Effort: Pulmonary effort is normal. No respiratory distress.  Abdominal:     General: Bowel sounds are increased. There is no distension.     Palpations: Abdomen is soft.     Tenderness: There is generalized abdominal tenderness. There is guarding. There is no rebound.   Skin:    General: Skin is warm.   Neurological:     Mental Status: She is alert and oriented to person, place, and time. Mental status is at baseline.   Psychiatric:        Mood and Affect: Mood normal.     (all labs ordered are listed, but only abnormal results are displayed) Labs Reviewed  COMPREHENSIVE METABOLIC PANEL WITH GFR - Abnormal; Notable for the following components:      Result Value   Sodium 128 (*)    Potassium  3.0 (*)    Chloride 92 (*)    CO2 21 (*)    Glucose, Bld 130 (*)    BUN 33 (*)    Total Protein 9.3 (*)    Albumin 5.2 (*)    Total Bilirubin 1.9 (*)    All other components within normal limits  CBC - Abnormal; Notable for the following components:   RBC 5.84 (*)    Hemoglobin 17.9 (*)    HCT 49.0 (*)    MCHC 36.5 (*)    All other components within normal limits  URINALYSIS, ROUTINE W REFLEX MICROSCOPIC - Abnormal; Notable for the following components:   Protein, ur 100 (*)    All other components within normal limits  LIPASE, BLOOD  HCG, SERUM, QUALITATIVE    EKG: None  Radiology: No results found.   Procedures   Medications  Ordered in the ED  potassium chloride SA (KLOR-CON M) CR tablet 40 mEq (has no administration in time range)  sodium chloride  0.9 % bolus 1,000 mL (1,000 mLs Intravenous New Bag/Given 02/03/24 1400)  ondansetron  (ZOFRAN ) injection 4 mg (4 mg Intravenous Given 02/03/24 1400)  morphine  (PF) 4 MG/ML injection 4 mg (4 mg Intravenous Given 02/03/24 1400)                                    Medical Decision Making Amount and/or Complexity of Data Reviewed Labs: ordered. Radiology: ordered.  Risk Prescription drug management.   28 year old female presents emergency department with nausea/vomiting/diarrhea and abdominal pain.  Recently was seen with concern for opiate withdrawal.  Patient states that she no longer takes opiates and does not believe that this is withdrawal.  She admits to everyday marijuana use but has never been diagnosed with cyclic vomiting before.  She is tachycardic on arrival, stable blood pressure, afebrile.  Blood work shows signs of mild dehydration, sodium is 128, potassium is 3.  Patient was given IV fluids and potassium replacement.  Lipase is normal.  Urinalysis is unremarkable.  CT of the abdomen pelvis shows no acute finding.  After IV medicine patient feels improved, she has been able to eat and drink freely.  Discussed with patient outpatient primary follow-up and possibly establishing with gastroenterology if this continues to be an ongoing problem.  Counseled the patient on marijuana cessation.  Counseled patient for approximately 5 minutes regarding smoking cessation. Discussed risks of smoking and how they applied and affected their visit here today. Patient not ready to quit at this time, however will follow up with their primary doctor when they are.   CPT code: 00593: intermediate counseling for smoking cessation    Patient at this time appears safe and stable for discharge and close outpatient follow up. Discharge plan and strict return to ED precautions  discussed, patient verbalizes understanding and agreement.       Final diagnoses:  None    ED Discharge Orders     None          Bari Roxie HERO, DO 02/03/24 2105

## 2024-02-03 NOTE — ED Notes (Signed)
 Pt walked to door, opened door, then laid in the doorway face down. Got pt a wheelchair and assisted her to the bathroom

## 2024-02-03 NOTE — ED Triage Notes (Signed)
 [facility] reports pt coming from home c/o abdominal pain for the past week. Also been having n/v/d the past two days but has stopped. Pt has been able to drink fluids.

## 2024-02-03 NOTE — ED Notes (Signed)
 Pt is attempting to call her ride at this time

## 2024-03-07 ENCOUNTER — Encounter (HOSPITAL_COMMUNITY): Payer: Self-pay | Admitting: Emergency Medicine

## 2024-03-07 ENCOUNTER — Emergency Department (HOSPITAL_COMMUNITY)
Admission: EM | Admit: 2024-03-07 | Discharge: 2024-03-07 | Discharge status: Against Medical Advice | Payer: MEDICAID | Attending: Emergency Medicine | Admitting: Emergency Medicine

## 2024-03-07 ENCOUNTER — Other Ambulatory Visit: Payer: Self-pay

## 2024-03-07 DIAGNOSIS — R109 Unspecified abdominal pain: Secondary | ICD-10-CM | POA: Diagnosis present

## 2024-03-07 DIAGNOSIS — Z5321 Procedure and treatment not carried out due to patient leaving prior to being seen by health care provider: Secondary | ICD-10-CM | POA: Diagnosis not present

## 2024-03-07 DIAGNOSIS — R112 Nausea with vomiting, unspecified: Secondary | ICD-10-CM | POA: Insufficient documentation

## 2024-03-07 NOTE — ED Triage Notes (Signed)
 Pt BIB EMS from home, c/o abdominal pain N/V since yesterday. Abdominal tender to the touch. Vomit x3 today. Given 4 mg zofran    BP 126/80 P 75 SpO2 98% T 97.9

## 2024-03-09 ENCOUNTER — Other Ambulatory Visit: Payer: Self-pay

## 2024-03-09 ENCOUNTER — Emergency Department (HOSPITAL_COMMUNITY)
Admission: EM | Admit: 2024-03-09 | Discharge: 2024-03-09 | Disposition: A | Discharge status: Home / Self Care | Payer: MEDICAID | Attending: Emergency Medicine | Admitting: Emergency Medicine

## 2024-03-09 ENCOUNTER — Emergency Department (HOSPITAL_COMMUNITY): Payer: MEDICAID

## 2024-03-09 ENCOUNTER — Encounter (HOSPITAL_COMMUNITY): Payer: Self-pay | Admitting: Emergency Medicine

## 2024-03-09 DIAGNOSIS — F17290 Nicotine dependence, other tobacco product, uncomplicated: Secondary | ICD-10-CM | POA: Diagnosis not present

## 2024-03-09 DIAGNOSIS — R1084 Generalized abdominal pain: Secondary | ICD-10-CM | POA: Diagnosis not present

## 2024-03-09 DIAGNOSIS — R112 Nausea with vomiting, unspecified: Secondary | ICD-10-CM | POA: Insufficient documentation

## 2024-03-09 DIAGNOSIS — R109 Unspecified abdominal pain: Secondary | ICD-10-CM | POA: Diagnosis present

## 2024-03-09 DIAGNOSIS — Z5329 Procedure and treatment not carried out because of patient's decision for other reasons: Secondary | ICD-10-CM | POA: Insufficient documentation

## 2024-03-09 LAB — COMPREHENSIVE METABOLIC PANEL WITH GFR
ALT: 14 U/L (ref 0–44)
AST: 21 U/L (ref 15–41)
Albumin: 5.9 g/dL — ABNORMAL HIGH (ref 3.5–5.0)
Alkaline Phosphatase: 62 U/L (ref 38–126)
Anion gap: 16 — ABNORMAL HIGH (ref 5–15)
BUN: 30 mg/dL — ABNORMAL HIGH (ref 6–20)
CO2: 21 mmol/L — ABNORMAL LOW (ref 22–32)
Calcium: 10.7 mg/dL — ABNORMAL HIGH (ref 8.9–10.3)
Chloride: 95 mmol/L — ABNORMAL LOW (ref 98–111)
Creatinine, Ser: 1.8 mg/dL — ABNORMAL HIGH (ref 0.44–1.00)
GFR, Estimated: 39 mL/min — ABNORMAL LOW (ref >60)
Glucose, Bld: 126 mg/dL — ABNORMAL HIGH (ref 70–99)
Potassium: 3.5 mmol/L (ref 3.5–5.1)
Sodium: 132 mmol/L — ABNORMAL LOW (ref 135–145)
Total Bilirubin: 1.5 mg/dL — ABNORMAL HIGH (ref 0.0–1.2)
Total Protein: 10.2 g/dL — ABNORMAL HIGH (ref 6.5–8.1)

## 2024-03-09 LAB — URINALYSIS, W/ REFLEX TO CULTURE (INFECTION SUSPECTED)
Bilirubin Urine: NEGATIVE
Glucose, UA: NEGATIVE mg/dL
Hgb urine dipstick: NEGATIVE
Ketones, ur: 5 mg/dL — AB
Leukocytes,Ua: NEGATIVE
Nitrite: NEGATIVE
Protein, ur: 100 mg/dL — AB
Specific Gravity, Urine: 1.021 (ref 1.005–1.030)
pH: 5 (ref 5.0–8.0)

## 2024-03-09 LAB — CBC WITH DIFFERENTIAL/PLATELET
Abs Immature Granulocytes: 0.02 K/uL (ref 0.00–0.07)
Basophils Absolute: 0 K/uL (ref 0.0–0.1)
Basophils Relative: 0 %
Eosinophils Absolute: 0 K/uL (ref 0.0–0.5)
Eosinophils Relative: 0 %
HCT: 50.2 % — ABNORMAL HIGH (ref 36.0–46.0)
Hemoglobin: 17.6 g/dL — ABNORMAL HIGH (ref 12.0–15.0)
Immature Granulocytes: 0 %
Lymphocytes Relative: 24 %
Lymphs Abs: 2.1 K/uL (ref 0.7–4.0)
MCH: 30.2 pg (ref 26.0–34.0)
MCHC: 35.1 g/dL (ref 30.0–36.0)
MCV: 86.1 fL (ref 80.0–100.0)
Monocytes Absolute: 1.2 K/uL — ABNORMAL HIGH (ref 0.1–1.0)
Monocytes Relative: 14 %
Neutro Abs: 5.5 K/uL (ref 1.7–7.7)
Neutrophils Relative %: 62 %
Platelets: 283 K/uL (ref 150–400)
RBC: 5.83 MIL/uL — ABNORMAL HIGH (ref 3.87–5.11)
RDW: 13.2 % (ref 11.5–15.5)
WBC: 8.9 K/uL (ref 4.0–10.5)
nRBC: 0 % (ref 0.0–0.2)

## 2024-03-09 LAB — PREGNANCY, URINE: Preg Test, Ur: NEGATIVE

## 2024-03-09 LAB — LIPASE, BLOOD: Lipase: 27 U/L (ref 11–51)

## 2024-03-09 MED ORDER — SODIUM CHLORIDE 0.9 % IV BOLUS
1000.0000 mL | Freq: Once | INTRAVENOUS | Status: AC
  Administered 2024-03-09: 1000 mL via INTRAVENOUS

## 2024-03-09 MED ORDER — LACTATED RINGERS IV BOLUS
1000.0000 mL | Freq: Once | INTRAVENOUS | Status: AC
  Administered 2024-03-09: 1000 mL via INTRAVENOUS

## 2024-03-09 MED ORDER — DROPERIDOL 2.5 MG/ML IJ SOLN
2.5000 mg | Freq: Once | INTRAMUSCULAR | Status: AC
  Administered 2024-03-09: 2.5 mg via INTRAVENOUS
  Filled 2024-03-09: qty 2

## 2024-03-09 MED ORDER — ONDANSETRON HCL 4 MG/2ML IJ SOLN
4.0000 mg | Freq: Once | INTRAMUSCULAR | Status: AC
  Administered 2024-03-09: 4 mg via INTRAVENOUS
  Filled 2024-03-09: qty 2

## 2024-03-09 MED ORDER — DIAZEPAM 5 MG/ML IJ SOLN: 5.0000 mg | Freq: Once | INTRAMUSCULAR | Status: DC

## 2024-03-09 MED ORDER — KETOROLAC TROMETHAMINE 15 MG/ML IJ SOLN: 15.0000 mg | Freq: Once | INTRAMUSCULAR | Status: DC

## 2024-03-09 NOTE — ED Provider Notes (Signed)
 [place] EMERGENCY DEPARTMENT AT [facility] Provider Note  CSN: [id number] Arrival date & time: 03/09/24 9360  Chief Complaint(s) Emesis  HPI Stacey Rodriguez is a 28 y.o. female here today for nausea, vomiting abdominal pain.  Patient denies any history of intra-abdominal surgeries.  She has a Nexplanon  implant.  Does not regularly get periods.  Patient with a prior history of opioid use disorder, states she has not used any opiates in 1 month.  She is not currently on any medication such as Suboxone or methadone.  Patient does endorse using marijuana daily.   Past Medical History Past Medical History:  Diagnosis Date   Genital herpes    Hx of chlamydia infection 2017   Hx of trichomoniasis 2017   Patient Active Problem List   Diagnosis Date Noted   Overdose 09/23/2023   Methadone overdose, accidental or unintentional, initial encounter (HCC) 09/23/2023   [redacted] weeks gestation of pregnancy 08/21/2019   Indication for care in labor and delivery, antepartum 08/20/2019   GBS bacteriuria 07/25/2019   GBS (group B Streptococcus carrier), +RV culture, currently pregnant 07/22/2019   Alpha thalassemia silent carrier 05/15/2019   Sickle cell trait (HCC) 05/15/2019   Genital herpes affecting pregnancy 05/15/2019   Supervision of other normal pregnancy, antepartum 01/27/2019   Substance abuse (HCC) 11/11/2015   Adjustment disorder with mixed anxiety and depressed mood 11/11/2015   ECZEMA 05/27/2008   Home Medication(s) Prior to Admission medications   Medication Sig Start Date End Date Taking? Authorizing Provider  etonogestrel  (NEXPLANON ) 68 MG IMPL implant 1 each by Subdermal route once.    [provider]  ibuprofen  (ADVIL ) 800 MG tablet Take 1 tablet (800 mg total) by mouth 3 (three) times daily. 07/03/23   Rodriguez-Southworth, Sylvia, PA-C  ondansetron  (ZOFRAN ) 4 MG tablet Take 1 tablet (4 mg total) by mouth every 6 (six) hours. 02/03/24   Horton, Kristie M,  DO  albuterol  (PROVENTIL  HFA;VENTOLIN  HFA) 108 (90 Base) MCG/ACT inhaler Inhale 1-2 puffs into the lungs every 6 (six) hours as needed for wheezing or shortness of breath. Patient not taking: Reported on 01/29/2020 12/13/17 05/19/20  Mario Million, MD                                                                                                                                    Past Surgical History Past Surgical History:  Procedure Laterality Date   CESAREAN SECTION N/A 08/21/2019   Procedure: CESAREAN SECTION;  Surgeon: Alger Gong, MD;  Location: [facility] LD ORS;  Service: Obstetrics;  Laterality: N/A;   NO PAST SURGERIES     Family History Family History  Problem Relation Age of Onset   Asthma Mother    Healthy Father     Social History Social History   Tobacco Use   Smoking status: Every Day    Types: Cigars   Smokeless tobacco: Never  Vaping Use   Vaping status:  Never Used  Substance Use Topics   Alcohol use: Not Currently    Comment: ocassionally   Drug use: Yes    Types: Marijuana    Comment: Percocet abuse   Allergies Patient has no known allergies.  Review of Systems Review of Systems  Physical Exam Vital Signs  I have reviewed the triage vital signs BP (!) 132/92 (BP Location: Left Arm)   Pulse 86   Temp 98.1 F (36.7 C) (Oral)   Resp 18   Ht 5' 8 (1.727 m)   Wt 63.5 kg   SpO2 100%   BMI 21.29 kg/m   Physical Exam Vitals and nursing note reviewed.  Cardiovascular:     Rate and Rhythm: Normal rate.  Abdominal:     General: Abdomen is flat. There is no distension.     Palpations: Abdomen is soft.     Tenderness: There is no abdominal tenderness. There is no guarding.  Skin:    General: Skin is warm and dry.  Neurological:     Mental Status: She is alert.     ED Results and Treatments Labs (all labs ordered are listed, but only abnormal results are displayed) Labs Reviewed  COMPREHENSIVE METABOLIC PANEL WITH GFR - Abnormal; Notable for  the following components:      Result Value   Sodium 132 (*)    Chloride 95 (*)    CO2 21 (*)    Glucose, Bld 126 (*)    BUN 30 (*)    Creatinine, Ser 1.80 (*)    Calcium 10.7 (*)    Total Protein 10.2 (*)    Albumin 5.9 (*)    Total Bilirubin 1.5 (*)    GFR, Estimated 39 (*)    Anion gap 16 (*)    All other components within normal limits  CBC WITH DIFFERENTIAL/PLATELET - Abnormal; Notable for the following components:   RBC 5.83 (*)    Hemoglobin 17.6 (*)    HCT 50.2 (*)    Monocytes Absolute 1.2 (*)    All other components within normal limits  URINALYSIS, W/ REFLEX TO CULTURE (INFECTION SUSPECTED) - Abnormal; Notable for the following components:   APPearance HAZY (*)    Ketones, ur 5 (*)    Protein, ur 100 (*)    Bacteria, UA RARE (*)    All other components within normal limits  PREGNANCY, URINE  LIPASE, BLOOD                                                                                                                          Radiology No results found.  Pertinent labs & imaging results that were available during my care of the patient were reviewed by me and considered in my medical decision making (see MDM for details).  Medications Ordered in ED Medications  diazepam  (VALIUM ) injection 5 mg (has no administration in time range)  sodium chloride  0.9 % bolus 1,000 mL (0 mLs Intravenous Stopped 03/09/24 0821)  ondansetron  (ZOFRAN ) injection 4 mg (4 mg Intravenous Given 03/09/24 0729)  droperidol  (INAPSINE ) 2.5 MG/ML injection 2.5 mg (2.5 mg Intravenous Given 03/09/24 0729)  lactated ringers  bolus 1,000 mL (0 mLs Intravenous Stopped 03/09/24 1113)  droperidol  (INAPSINE ) 2.5 MG/ML injection 2.5 mg (2.5 mg Intravenous Given 03/09/24 9081)                                                                                                                                     Procedures Procedures  (including critical care time)  Medical Decision Making / ED  Course   This patient presents to the ED for concern of vomiting nausea abdominal pain, this involves an extensive number of treatment options, and is a complaint that carries with it a high risk of complications and morbidity.  The differential diagnosis includes cannabinoid hyperemesis, less likely obstruction, less likely acute intra-abdominal infection, less likely cholecystitis, less likely appendicitis.  MDM: Patient's abdomen soft.  Patient very uncomfortable appearing, rolling around on the bed.  Will check basic labs on the patient.  Will provide her with some droperidol .  Symptoms have been ongoing Over the last several days.  My concern is that the patient likely has some cannabinoid hyperemesis given her frequent utilization.  Lower suspicion for acute intra-abdominal infection given the patient's frequent movements on the bed.  Reassessment 11:20 AM-patient was pending CT scan of the abdomen, left against medical vice without me being able to talk to the patient, was informed by nursing staff after the patient already left the emergency room.  Apparently the patient became upset that she was not able to sit in the shower.  Patient's blood work reviewed, no UTI, she did have an acute kidney injury.  Had received IV fluids.  Had not received additional Valium  which have been ordered.   Additional history obtained: -Additional history obtained from  -External records from outside source obtained and reviewed including: Chart review including previous notes, labs, imaging, consultation notes   Lab Tests: -I ordered, reviewed, and interpreted labs.   The pertinent results include:   Labs Reviewed  COMPREHENSIVE METABOLIC PANEL WITH GFR - Abnormal; Notable for the following components:      Result Value   Sodium 132 (*)    Chloride 95 (*)    CO2 21 (*)    Glucose, Bld 126 (*)    BUN 30 (*)    Creatinine, Ser 1.80 (*)    Calcium 10.7 (*)    Total Protein 10.2 (*)    Albumin 5.9  (*)    Total Bilirubin 1.5 (*)    GFR, Estimated 39 (*)    Anion gap 16 (*)    All other components within normal limits  CBC WITH DIFFERENTIAL/PLATELET - Abnormal; Notable for the following components:   RBC 5.83 (*)    Hemoglobin 17.6 (*)    HCT 50.2 (*)    Monocytes Absolute 1.2 (*)  All other components within normal limits  URINALYSIS, W/ REFLEX TO CULTURE (INFECTION SUSPECTED) - Abnormal; Notable for the following components:   APPearance HAZY (*)    Ketones, ur 5 (*)    Protein, ur 100 (*)    Bacteria, UA RARE (*)    All other components within normal limits  PREGNANCY, URINE  LIPASE, BLOOD     Medicines ordered and prescription drug management: Meds ordered this encounter  Medications   sodium chloride  0.9 % bolus 1,000 mL   ondansetron  (ZOFRAN ) injection 4 mg   droperidol  (INAPSINE ) 2.5 MG/ML injection 2.5 mg   lactated ringers  bolus 1,000 mL   droperidol  (INAPSINE ) 2.5 MG/ML injection 2.5 mg   diazepam  (VALIUM ) injection 5 mg   DISCONTD: ketorolac  (TORADOL ) 15 MG/ML injection 15 mg    -I have reviewed the patients home medicines and have made adjustments as needed   Cardiac Monitoring: The patient was maintained on a cardiac monitor.  I personally viewed and interpreted the cardiac monitored which showed an underlying rhythm of: Sinus rhythm  Social Determinants of Health:  Factors impacting patients care include: Lack of access to primary care   Reevaluation: After the interventions noted above, I reevaluated the patient and found that they have :improved  Co morbidities that complicate the patient evaluation  Past Medical History:  Diagnosis Date   Genital herpes    Hx of chlamydia infection 2017   Hx of trichomoniasis 2017      Dispostion: AGAINST MEDICAL ADVICE    Final Clinical Impression(s) / ED Diagnoses Final diagnoses:  Generalized abdominal pain     @PCDICTATION @    Mannie Pac T, DO 03/09/24 1124

## 2024-03-09 NOTE — ED Notes (Signed)
 Patient just left AMA. I removed patients Iv out before she left and walked her out charge nurse and Rn notified. Patient was unhappy because she was unable to sit in the shower which is per this patients unusual.

## 2024-03-09 NOTE — ED Triage Notes (Signed)
 Patient BIB EMS for evaluation of nausea and vomiting.  States symptoms started on Thursday.  Was seen in triage on Thursday, however left prior to being seen by MD.  Reports I'm clean when asked about medication use.  Having abdominal pain. No reports of fevers.  States I can't keep anything down.

## 2024-03-09 NOTE — ED Notes (Signed)
Pt refused to provide urine sample at this time

## 2024-03-12 ENCOUNTER — Ambulatory Visit
Admission: EM | Admit: 2024-03-12 | Discharge: 2024-03-12 | Disposition: A | Discharge status: Other Acute Inpatient Hospital | Payer: MEDICAID | Attending: Family Medicine | Admitting: Family Medicine

## 2024-03-12 ENCOUNTER — Other Ambulatory Visit: Payer: Self-pay

## 2024-03-12 ENCOUNTER — Emergency Department (HOSPITAL_BASED_OUTPATIENT_CLINIC_OR_DEPARTMENT_OTHER)
Admission: EM | Admit: 2024-03-12 | Discharge: 2024-03-12 | Disposition: A | Discharge status: Home / Self Care | Payer: MEDICAID | Source: Other Acute Inpatient Hospital | Attending: Emergency Medicine | Admitting: Emergency Medicine

## 2024-03-12 ENCOUNTER — Encounter (HOSPITAL_BASED_OUTPATIENT_CLINIC_OR_DEPARTMENT_OTHER): Payer: Self-pay

## 2024-03-12 DIAGNOSIS — M546 Pain in thoracic spine: Secondary | ICD-10-CM | POA: Diagnosis not present

## 2024-03-12 DIAGNOSIS — M549 Dorsalgia, unspecified: Secondary | ICD-10-CM | POA: Diagnosis present

## 2024-03-12 LAB — CBC WITH DIFFERENTIAL/PLATELET
Abs Immature Granulocytes: 0.02 K/uL (ref 0.00–0.07)
Basophils Absolute: 0 K/uL (ref 0.0–0.1)
Basophils Relative: 1 %
Eosinophils Absolute: 0 K/uL (ref 0.0–0.5)
Eosinophils Relative: 0 %
HCT: 43.8 % (ref 36.0–46.0)
Hemoglobin: 16.2 g/dL — ABNORMAL HIGH (ref 12.0–15.0)
Immature Granulocytes: 0 %
Lymphocytes Relative: 27 %
Lymphs Abs: 2.1 K/uL (ref 0.7–4.0)
MCH: 30.7 pg (ref 26.0–34.0)
MCHC: 37 g/dL — ABNORMAL HIGH (ref 30.0–36.0)
MCV: 83 fL (ref 80.0–100.0)
Monocytes Absolute: 1.3 K/uL — ABNORMAL HIGH (ref 0.1–1.0)
Monocytes Relative: 17 %
Neutro Abs: 4.4 K/uL (ref 1.7–7.7)
Neutrophils Relative %: 55 %
Platelets: 232 K/uL (ref 150–400)
RBC: 5.28 MIL/uL — ABNORMAL HIGH (ref 3.87–5.11)
RDW: 12.3 % (ref 11.5–15.5)
WBC: 7.9 K/uL (ref 4.0–10.5)
nRBC: 0 % (ref 0.0–0.2)

## 2024-03-12 LAB — COMPREHENSIVE METABOLIC PANEL WITH GFR
ALT: 35 U/L (ref 0–44)
AST: 24 U/L (ref 15–41)
Albumin: 5.1 g/dL — ABNORMAL HIGH (ref 3.5–5.0)
Alkaline Phosphatase: 63 U/L (ref 38–126)
Anion gap: 17 — ABNORMAL HIGH (ref 5–15)
BUN: 23 mg/dL — ABNORMAL HIGH (ref 6–20)
CO2: 24 mmol/L (ref 22–32)
Calcium: 10.3 mg/dL (ref 8.9–10.3)
Chloride: 85 mmol/L — ABNORMAL LOW (ref 98–111)
Creatinine, Ser: 0.95 mg/dL (ref 0.44–1.00)
GFR, Estimated: >60 mL/min (ref >60)
Glucose, Bld: 121 mg/dL — ABNORMAL HIGH (ref 70–99)
Potassium: 2.8 mmol/L — ABNORMAL LOW (ref 3.5–5.1)
Sodium: 125 mmol/L — ABNORMAL LOW (ref 135–145)
Total Bilirubin: 1.3 mg/dL — ABNORMAL HIGH (ref 0.0–1.2)
Total Protein: 8.3 g/dL — ABNORMAL HIGH (ref 6.5–8.1)

## 2024-03-12 LAB — MAGNESIUM: Magnesium: 2.4 mg/dL (ref 1.7–2.4)

## 2024-03-12 MED ORDER — KETOROLAC TROMETHAMINE 15 MG/ML IJ SOLN
15.0000 mg | Freq: Once | INTRAMUSCULAR | Status: AC
Start: 2024-03-12 — End: 2024-03-12
  Administered 2024-03-12: 15 mg via INTRAVENOUS
  Filled 2024-03-12: qty 1

## 2024-03-12 MED ORDER — POTASSIUM CHLORIDE CRYS ER 20 MEQ PO TBCR
40.0000 meq | EXTENDED_RELEASE_TABLET | Freq: Once | ORAL | Status: AC
  Administered 2024-03-12: 40 meq via ORAL
  Filled 2024-03-12: qty 2

## 2024-03-12 MED ORDER — SODIUM CHLORIDE 0.9 % IV BOLUS
1000.0000 mL | Freq: Once | INTRAVENOUS | Status: AC
Start: 2024-03-12 — End: 2024-03-12
  Administered 2024-03-12: 1000 mL via INTRAVENOUS

## 2024-03-12 MED ORDER — POTASSIUM CHLORIDE CRYS ER 20 MEQ PO TBCR: 20.0000 meq | EXTENDED_RELEASE_TABLET | Freq: Two times a day (BID) | ORAL | 0 refills | Status: AC

## 2024-03-12 MED ORDER — MORPHINE SULFATE (PF) 4 MG/ML IV SOLN
6.0000 mg | Freq: Once | INTRAVENOUS | Status: AC
  Administered 2024-03-12: 6 mg via INTRAVENOUS
  Filled 2024-03-12: qty 2

## 2024-03-12 MED ORDER — ONDANSETRON HCL 4 MG/2ML IJ SOLN
4.0000 mg | Freq: Once | INTRAMUSCULAR | Status: AC
  Administered 2024-03-12: 4 mg via INTRAVENOUS
  Filled 2024-03-12: qty 2

## 2024-03-12 MED ORDER — POTASSIUM CHLORIDE 10 MEQ/100ML IV SOLN
10.0000 meq | Freq: Once | INTRAVENOUS | Status: AC
  Administered 2024-03-12: 10 meq via INTRAVENOUS
  Filled 2024-03-12: qty 100

## 2024-03-12 MED ORDER — METHOCARBAMOL 500 MG PO TABS: 500.0000 mg | ORAL_TABLET | Freq: Two times a day (BID) | ORAL | 0 refills | Status: AC

## 2024-03-12 MED ORDER — METHOCARBAMOL 500 MG PO TABS
500.0000 mg | ORAL_TABLET | Freq: Once | ORAL | Status: AC
Start: 2024-03-12 — End: 2024-03-12
  Administered 2024-03-12: 500 mg via ORAL
  Filled 2024-03-12: qty 1

## 2024-03-12 NOTE — ED Provider Notes (Signed)
 [facility]    CSN: [id number] Arrival date & time: 03/12/24  1241      History   Chief Complaint No chief complaint on file.   HPI Stacey Rodriguez is a 28 y.o. female with a past medical history of illicit drug use, sickle cell trait presents for evaluation of severe back pain.  Patient reports 6 days of a persistent 10 out of 10 back pain that begins at her mid thoracic spine and goes to her lumbar spine.  She states sometimes at night she has numbness and tingling of her legs but she denies any bowel or bladder incontinence or saddle paresthesia.  No known injury or inciting event.  No dysuria.  No history of injuries or surgeries to her back.  She was seen at the emergency room on July 27 for abdominal pain and nausea vomiting.  CMP did show creatinine of 1.80 and BUN of 30.  She was given fluids nausea medication but left AMA.  She is been taking Tylenol  and ibuprofen  for her symptoms, she states her last dose was 1 to 2 days ago.  No other concerns at this time.  HPI  Past Medical History:  Diagnosis Date   Genital herpes    Hx of chlamydia infection 2017   Hx of trichomoniasis 2017    Patient Active Problem List   Diagnosis Date Noted   Overdose 09/23/2023   Methadone overdose, accidental or unintentional, initial encounter (HCC) 09/23/2023   [redacted] weeks gestation of pregnancy 08/21/2019   Indication for care in labor and delivery, antepartum 08/20/2019   GBS bacteriuria 07/25/2019   GBS (group B Streptococcus carrier), +RV culture, currently pregnant 07/22/2019   Alpha thalassemia silent carrier 05/15/2019   Sickle cell trait (HCC) 05/15/2019   Genital herpes affecting pregnancy 05/15/2019   Supervision of other normal pregnancy, antepartum 01/27/2019   Substance abuse (HCC) 11/11/2015   Adjustment disorder with mixed anxiety and depressed mood 11/11/2015   ECZEMA 05/27/2008    Past Surgical History:  Procedure Laterality Date   CESAREAN SECTION N/A  08/21/2019   Procedure: CESAREAN SECTION;  Surgeon: Alger Gong, MD;  Location: [facility] LD ORS;  Service: Obstetrics;  Laterality: N/A;   NO PAST SURGERIES      OB History     Gravida  1   Para  1   Term  1   Preterm      AB      Living  1      SAB      IAB      Ectopic      Multiple  0   Live Births  1            Home Medications    Prior to Admission medications   Medication Sig Start Date End Date Taking? Authorizing Provider  etonogestrel  (NEXPLANON ) 68 MG IMPL implant 1 each by Subdermal route once.    [provider]  ibuprofen  (ADVIL ) 800 MG tablet Take 1 tablet (800 mg total) by mouth 3 (three) times daily. 07/03/23   Rodriguez-Southworth, Sylvia, PA-C  ondansetron  (ZOFRAN ) 4 MG tablet Take 1 tablet (4 mg total) by mouth every 6 (six) hours. 02/03/24   Horton, Roxie HERO, DO  albuterol  (PROVENTIL  HFA;VENTOLIN  HFA) 108 (90 Base) MCG/ACT inhaler Inhale 1-2 puffs into the lungs every 6 (six) hours as needed for wheezing or shortness of breath. Patient not taking: Reported on 01/29/2020 12/13/17 05/19/20  Mario Million, MD    Family History Family  History  Problem Relation Age of Onset   Asthma Mother    Healthy Father     Social History Social History   Tobacco Use   Smoking status: Every Day    Types: Cigars   Smokeless tobacco: Never  Vaping Use   Vaping status: Never Used  Substance Use Topics   Alcohol use: Not Currently    Comment: ocassionally   Drug use: Yes    Types: Marijuana    Comment: Percocet abuse     Allergies   Patient has no known allergies.   Review of Systems Review of Systems  Musculoskeletal:  Positive for back pain.     Physical Exam Triage Vital Signs ED Triage Vitals  Encounter Vitals Group     BP 03/12/24 1304 101/69     Girls Systolic BP Percentile --      Girls Diastolic BP Percentile --      Boys Systolic BP Percentile --      Boys Diastolic BP Percentile --      Pulse Rate 03/12/24 1304 73      Resp 03/12/24 1304 17     Temp 03/12/24 1304 98.8 F (37.1 C)     Temp Source 03/12/24 1304 Oral     SpO2 --      Weight --      Height --      Head Circumference --      Peak Flow --      Pain Score 03/12/24 1305 10     Pain Loc --      Pain Education --      Exclude from Growth Chart --    No data found.  Updated Vital Signs BP 101/69 (BP Location: Left Arm)   Pulse 73   Temp 98.8 F (37.1 C) (Oral)   Resp 17   Visual Acuity Right Eye Distance:   Left Eye Distance:   Bilateral Distance:    Right Eye Near:   Left Eye Near:    Bilateral Near:     Physical Exam Vitals and nursing note reviewed.  Constitutional:      Appearance: Normal appearance.     Comments: Patient sitting balled up in the bed rocking back and forth.  Intermittent crying  HENT:     Head: Normocephalic and atraumatic.  Eyes:     Pupils: Pupils are equal, round, and reactive to light.  Cardiovascular:     Rate and Rhythm: Normal rate.  Pulmonary:     Effort: Pulmonary effort is normal.  Musculoskeletal:     Cervical back: Normal.     Thoracic back: Bony tenderness present. No swelling, edema, deformity, signs of trauma, lacerations or tenderness. Decreased range of motion. No scoliosis.     Lumbar back: Tenderness and bony tenderness present. No swelling, edema, deformity, signs of trauma, lacerations or spasms. Decreased range of motion. Negative right straight leg raise test and negative left straight leg raise test. No scoliosis.       Back:     Comments: Strength 5 out of 5 bilateral lower extremities  Skin:    General: Skin is warm and dry.  Neurological:     General: No focal deficit present.     Mental Status: She is alert and oriented to person, place, and time.  Psychiatric:        Mood and Affect: Mood normal.        Behavior: Behavior normal.      UC Treatments / Results  Labs (all labs ordered are listed, but only abnormal results are displayed) Labs Reviewed - No  data to display  EKG   Radiology No results found.  Procedures Procedures (including critical care time)  Medications Ordered in UC Medications - No data to display  Initial Impression / Assessment and Plan / UC Course  I have reviewed the triage vital signs and the nursing notes.  Pertinent labs & imaging results that were available during my care of the patient were reviewed by me and considered in my medical decision making (see chart for details).     I reviewed exam and symptoms with patient.  Discussed limitations and abilities of urgent care.  Patient presenting with 6 days of severe 10 out of 10 back pain.  Unable to give her Toradol  in clinic due to her recent kidney function showing elevated creatinine.  Discussed pain management with her and given her her reported pain discussed ER evaluation.  She is in agreement with plan and will go POV with her friend driving to the emergency room, she declined EMS transfer.  Will defer any x-rays or urine testing to the ER.  She was instructed to pull over and call 911 for any worsening symptoms that occur in transit and she verbalized understanding. Final Clinical Impressions(s) / UC Diagnoses   Final diagnoses:  Severe back pain     Discharge Instructions      Please go to the emergency room for further evaluation of your back pain    ED Prescriptions   None    PDMP not reviewed this encounter.   Loreda Myla SAUNDERS, NP 03/12/24 [phone]

## 2024-03-12 NOTE — ED Notes (Signed)
 Reviewed discharge instructions, follow up and medications with pt. Pt states understanding. Pain resolved at discharge. Ambulatory at discharge family

## 2024-03-12 NOTE — ED Triage Notes (Signed)
 Pt voices back pain that is like a stabbing feeling. Was seen at [facility] today. Has tried to take care of pain at home but has been unable to get relief. Denies burning with urination.

## 2024-03-12 NOTE — ED Notes (Signed)
 Patient is being discharged from the Urgent Care and sent to the Emergency Department via POV . Per Myla Bold, NP, patient is in need of higher level of care Patient is aware and verbalizes understanding of plan of care.  Vitals:   03/12/24 1304  BP: 101/69  Pulse: 73  Resp: 17  Temp: 98.8 F (37.1 C)

## 2024-03-12 NOTE — Discharge Instructions (Signed)
 Please go to the emergency room for further evaluation of your back pain

## 2024-03-12 NOTE — ED Provider Notes (Signed)
 [facility] Provider Note   CSN: [id number] Arrival date & time: 03/12/24  1351     Patient presents with: Back Pain   Stacey Rodriguez is a 28 y.o. female with history of polysubstance use presents with complaints of back pain.  No injury or trauma.  Pain has been ongoing for the past 6 days.  She states it is worsening.  She denies any IV drug use, history of cancer.  She has no radicular symptoms.  No urinary fecal incontinence.  Was evaluated at urgent care and referred here today.  She left AMA about a week ago after being found to have an AKI.  Reports difficulty walking.    Back Pain     Past Medical History:  Diagnosis Date   Genital herpes    Hx of chlamydia infection 2017   Hx of trichomoniasis 2017   Past Surgical History:  Procedure Laterality Date   CESAREAN SECTION N/A 08/21/2019   Procedure: CESAREAN SECTION;  Surgeon: Alger Gong, MD;  Location: [facility] LD ORS;  Service: Obstetrics;  Laterality: N/A;   NO PAST SURGERIES       Prior to Admission medications   Medication Sig Start Date End Date Taking? Authorizing Provider  methocarbamol  (ROBAXIN ) 500 MG tablet Take 1 tablet (500 mg total) by mouth 2 (two) times daily. 03/12/24  Yes Donnajean Lynwood DEL, PA-C  potassium chloride  SA (KLOR-CON  M) 20 MEQ tablet Take 1 tablet (20 mEq total) by mouth 2 (two) times daily. 03/12/24  Yes Donnajean Lynwood DEL, PA-C  etonogestrel  (NEXPLANON ) 68 MG IMPL implant 1 each by Subdermal route once.    [provider]  ibuprofen  (ADVIL ) 800 MG tablet Take 1 tablet (800 mg total) by mouth 3 (three) times daily. 07/03/23   Rodriguez-Southworth, Sylvia, PA-C  ondansetron  (ZOFRAN ) 4 MG tablet Take 1 tablet (4 mg total) by mouth every 6 (six) hours. 02/03/24   Horton, Roxie HERO, DO  albuterol  (PROVENTIL  HFA;VENTOLIN  HFA) 108 (90 Base) MCG/ACT inhaler Inhale 1-2 puffs into the lungs every 6 (six) hours as needed for wheezing or shortness  of breath. Patient not taking: Reported on 01/29/2020 12/13/17 05/19/20  Mario Million, MD    Allergies: Patient has no known allergies.    Review of Systems  Musculoskeletal:  Positive for back pain.    Updated Vital Signs BP 128/81   Pulse 95   Temp 99.3 F (37.4 C) (Oral)   Resp 16   Ht 5' 8 (1.727 m)   Wt 61.2 kg   SpO2 100%   BMI 20.53 kg/m   Physical Exam Vitals and nursing note reviewed.  Constitutional:      General: She is not in acute distress.    Appearance: She is well-developed.  HENT:     Head: Normocephalic and atraumatic.  Eyes:     Conjunctiva/sclera: Conjunctivae normal.  Cardiovascular:     Rate and Rhythm: Normal rate and regular rhythm.     Heart sounds: No murmur heard. Pulmonary:     Effort: Pulmonary effort is normal. No respiratory distress.     Breath sounds: Normal breath sounds.  Abdominal:     Palpations: Abdomen is soft.     Tenderness: There is no abdominal tenderness.  Musculoskeletal:        General: No swelling.     Cervical back: Neck supple.     Comments: Thoracic midline tenderness without overlying erythema warmth, 5 out of 5 lower extremity strength, no sensation  deficit  Skin:    General: Skin is warm and dry.     Capillary Refill: Capillary refill takes less than 2 seconds.  Neurological:     Mental Status: She is alert.  Psychiatric:        Mood and Affect: Mood normal.     (all labs ordered are listed, but only abnormal results are displayed) Labs Reviewed  CBC WITH DIFFERENTIAL/PLATELET - Abnormal; Notable for the following components:      Result Value   RBC 5.28 (*)    Hemoglobin 16.2 (*)    MCHC 37.0 (*)    Monocytes Absolute 1.3 (*)    All other components within normal limits  COMPREHENSIVE METABOLIC PANEL WITH GFR - Abnormal; Notable for the following components:   Sodium 125 (*)    Potassium 2.8 (*)    Chloride 85 (*)    Glucose, Bld 121 (*)    BUN 23 (*)    Total Protein 8.3 (*)    Albumin 5.1  (*)    Total Bilirubin 1.3 (*)    Anion gap 17 (*)    All other components within normal limits  MAGNESIUM   URINALYSIS, ROUTINE W REFLEX MICROSCOPIC    EKG: None  Radiology: No results found.   Procedures   Medications Ordered in the ED  methocarbamol  (ROBAXIN ) tablet 500 mg (500 mg Oral Given 03/12/24 1444)  sodium chloride  0.9 % bolus 1,000 mL (1,000 mLs Intravenous New Bag/Given 03/12/24 1532)  potassium chloride  SA (KLOR-CON  M) CR tablet 40 mEq (40 mEq Oral Given 03/12/24 1540)  potassium chloride  10 mEq in 100 mL IVPB (0 mEq Intravenous Stopped 03/12/24 1651)  ketorolac  (TORADOL ) 15 MG/ML injection 15 mg (15 mg Intravenous Given 03/12/24 1533)  morphine  (PF) 4 MG/ML injection 6 mg (6 mg Intravenous Given 03/12/24 1623)  ondansetron  (ZOFRAN ) injection 4 mg (4 mg Intravenous Given 03/12/24 1623)                                    Medical Decision Making  This patient presents to the ED with chief complaint(s) of back pain.  The complaint involves an extensive differential diagnosis and also carries with it a high risk of complications and morbidity.   Pertinent past medical history as listed in HPI  The differential diagnosis includes  Cauda equina, spinal abscess, discitis Additional history obtained: Records reviewed Care Everywhere/External Records  Assessment and management:   On exam patient is tearful, she notably has lower thoracic midline tenderness.  She has no paraspinal tenderness.  She has 5 out of 5 lower extremity strength.  Reassuringly she has no radicular symptoms.  Urinary fecal incontinence.  She does have a history of polysubstance use but adamantly denies IV drug use.  Although it appears from chart review that she has not been forthcoming about her use in the past.  No history of cancer.  She has great difficulty walking due to the pain.  Few days ago she left AMA with an AKI.  Will repeat labs today before considering imaging.  Reassuringly patient's AKI  has resolved however she has a new hyponatremia of 125 and hypokalemia 2.8, albumin is additionally elevated.  Reassuringly her hyponatremia is not symptomatic.  Will administer fluids, Toradol  shot, potassium supplementation.  Offered CT imaging however patient and family declined.  They would like to attempt control of pain.  Will administer a shot of morphine  here as  well.  If she has no improvement of her symptoms she will need MRI of her lumbar thoracic given her exam and history of drug use.  Upon reassessment patient reports marked improvement of her symptoms. She is interested in going home. She can ambulate without difficulty. Will send in prescription for Robaxin  and provide Ortho follow-up.  Will additionally provide PCP follow-up to recheck her electrolytes.  Independent ECG interpretation:  none  Independent labs interpretation:  The following labs were independently interpreted:  CBC with mild leukocytosis, CMP with sodium of 125, potassium 2.8, creatinine 0.95, anion gap of 17, Mg within normal limits  Independent visualization and interpretation of imaging: I independently visualized the following imaging with scope of interpretation limited to determining acute life threatening conditions related to emergency care: none    Consultations obtained:   none  Disposition:     Social Determinants of Health:   Patient's impaired access to primary care  increases the complexity of managing their presentation  This note was dictated with voice recognition software.  Despite best efforts at proofreading, errors may have occurred which can change the documentation meaning.       Final diagnoses:  Acute midline thoracic back pain    ED Discharge Orders          Ordered    methocarbamol  (ROBAXIN ) 500 MG tablet  2 times daily        03/12/24 1731    potassium chloride  SA (KLOR-CON  M) 20 MEQ tablet  2 times daily        03/12/24 1731               Donnajean Lynwood DEL, PA-C 03/12/24 1736    Ruthe Cornet, DO 03/13/24 [phone]

## 2024-03-12 NOTE — Discharge Instructions (Addendum)
 You were evaluated in the emergency room for back pain.  Your lab work was notable for low potassium and low sodium.  Potassium supplementation was sent into your pharmacy.  I would recommend drinking electrolyte containing beverages over the next week such as Pedialyte or Gatorade.  Please call the number on the sheet to establish with a primary care doctor.  Please call the other number on the sheet to follow-up with orthopedic doctor.  If you experience extreme worsening of your pain again I would recommend going to [facility] for possible MRI.

## 2024-03-12 NOTE — ED Triage Notes (Signed)
 Pt presents with mid back pain 10/10 described as stabbing.Pain is localized and not radiating. Started 6 days ago. Has tried advil , tylenol , heating pa and back rubs. Mild relief with soaking in hot water. Vomiting preceded 2 days before back pain.

## 2024-03-17 ENCOUNTER — Encounter (HOSPITAL_BASED_OUTPATIENT_CLINIC_OR_DEPARTMENT_OTHER): Payer: Self-pay | Admitting: Emergency Medicine

## 2024-03-17 ENCOUNTER — Other Ambulatory Visit: Payer: Self-pay

## 2024-03-17 ENCOUNTER — Emergency Department (HOSPITAL_BASED_OUTPATIENT_CLINIC_OR_DEPARTMENT_OTHER)
Admission: EM | Admit: 2024-03-17 | Discharge: 2024-03-18 | Disposition: A | Discharge status: Home / Self Care | Payer: MEDICAID | Attending: Emergency Medicine | Admitting: Emergency Medicine

## 2024-03-17 ENCOUNTER — Emergency Department (HOSPITAL_BASED_OUTPATIENT_CLINIC_OR_DEPARTMENT_OTHER): Payer: MEDICAID

## 2024-03-17 ENCOUNTER — Ambulatory Visit
Admission: EM | Admit: 2024-03-17 | Discharge: 2024-03-17 | Disposition: A | Discharge status: Other Acute Inpatient Hospital | Payer: MEDICAID | Source: Home / Self Care

## 2024-03-17 DIAGNOSIS — N898 Other specified noninflammatory disorders of vagina: Secondary | ICD-10-CM | POA: Insufficient documentation

## 2024-03-17 DIAGNOSIS — F17200 Nicotine dependence, unspecified, uncomplicated: Secondary | ICD-10-CM | POA: Diagnosis not present

## 2024-03-17 DIAGNOSIS — R600 Localized edema: Secondary | ICD-10-CM | POA: Diagnosis present

## 2024-03-17 LAB — BASIC METABOLIC PANEL WITH GFR
Anion gap: 8 (ref 5–15)
BUN: 9 mg/dL (ref 6–20)
CO2: 29 mmol/L (ref 22–32)
Calcium: 8.9 mg/dL (ref 8.9–10.3)
Chloride: 106 mmol/L (ref 98–111)
Creatinine, Ser: 0.69 mg/dL (ref 0.44–1.00)
GFR, Estimated: >60 mL/min (ref >60)
Glucose, Bld: 102 mg/dL — ABNORMAL HIGH (ref 70–99)
Potassium: 3.8 mmol/L (ref 3.5–5.1)
Sodium: 142 mmol/L (ref 135–145)

## 2024-03-17 LAB — CBC
HCT: 30.6 % — ABNORMAL LOW (ref 36.0–46.0)
Hemoglobin: 10.5 g/dL — ABNORMAL LOW (ref 12.0–15.0)
MCH: 31 pg (ref 26.0–34.0)
MCHC: 34.3 g/dL (ref 30.0–36.0)
MCV: 90.3 fL (ref 80.0–100.0)
Platelets: 209 K/uL (ref 150–400)
RBC: 3.39 MIL/uL — ABNORMAL LOW (ref 3.87–5.11)
RDW: 13.3 % (ref 11.5–15.5)
WBC: 5.6 K/uL (ref 4.0–10.5)
nRBC: 0 % (ref 0.0–0.2)

## 2024-03-17 LAB — PRO BRAIN NATRIURETIC PEPTIDE: Pro Brain Natriuretic Peptide: 295 pg/mL (ref <300.0)

## 2024-03-17 MED ORDER — PROCHLORPERAZINE EDISYLATE 10 MG/2ML IJ SOLN
10.0000 mg | Freq: Once | INTRAMUSCULAR | Status: DC
  Filled 2024-03-17: qty 2

## 2024-03-17 MED ORDER — FLUCONAZOLE 150 MG PO TABS: 150.0000 mg | ORAL_TABLET | Freq: Every day | ORAL | 0 refills | Status: AC

## 2024-03-17 MED ORDER — DIPHENHYDRAMINE HCL 50 MG/ML IJ SOLN
25.0000 mg | Freq: Once | INTRAMUSCULAR | Status: DC
  Filled 2024-03-17: qty 1

## 2024-03-17 NOTE — ED Triage Notes (Signed)
 Pt c/o feet swelling bilatx3d. Pt has 2+ swelling of legs and feet bilat.

## 2024-03-17 NOTE — ED Provider Notes (Incomplete)
 [place] [facility] Provider Note   CSN: [id number] Arrival date & time: 03/17/24  8048     Patient presents with: Leg Swelling and Leg Pain   Stacey Rodriguez is a 28 y.o. female with past medical history of substance abuse, sickle cell trait who presents to emergency department for evaluation of bilateral lower extremity pain and edema for the past 3 days.  Denies known injury, recent travel, OCP, history of blood clot, chest pain, no shortness of breath  Also complains of migraine in frontal region. Associated photophobia. Feels similar to migraines she has had in past. Gradually worsening. No onset of maximum intensity, recent head injury, nor thinners  Was recently evaluated in urgent care for vaginal itching and discharge and BLE swelling and recommended for ED evaluation for further workup of pedal edema. Provided fluconazole  for candidiasis  {Add pertinent medical, surgical, social history, OB history to HPI:32947}  Leg Pain      Prior to Admission medications   Medication Sig Start Date End Date Taking? Authorizing Provider  etonogestrel  (NEXPLANON ) 68 MG IMPL implant 1 each by Subdermal route once.    [provider]  fluconazole  (DIFLUCAN ) 150 MG tablet Take 1 tablet (150 mg total) by mouth daily for 2 doses. Take 1 tablet today and you may repeat in 3 days if your symptoms persist 03/17/24 03/19/24  Mayer, Jodi R, NP  ibuprofen  (ADVIL ) 800 MG tablet Take 1 tablet (800 mg total) by mouth 3 (three) times daily. 07/03/23   Rodriguez-Southworth, Sylvia, PA-C  methocarbamol  (ROBAXIN ) 500 MG tablet Take 1 tablet (500 mg total) by mouth 2 (two) times daily. 03/12/24   Donnajean Lynwood DEL, PA-C  ondansetron  (ZOFRAN ) 4 MG tablet Take 1 tablet (4 mg total) by mouth every 6 (six) hours. 02/03/24   Horton, Roxie HERO, DO  potassium chloride  SA (KLOR-CON  M) 20 MEQ tablet Take 1 tablet (20 mEq total) by mouth 2 (two) times daily. 03/12/24    Donnajean Lynwood DEL, PA-C  albuterol  (PROVENTIL  HFA;VENTOLIN  HFA) 108 (90 Base) MCG/ACT inhaler Inhale 1-2 puffs into the lungs every 6 (six) hours as needed for wheezing or shortness of breath. Patient not taking: Reported on 01/29/2020 12/13/17 05/19/20  Mario Million, MD    Allergies: Patient has no known allergies.    Review of Systems  Musculoskeletal:  Positive for joint swelling.    Updated Vital Signs BP 111/78 (BP Location: Right Arm)   Pulse 79   Temp 98.5 F (36.9 C)   Resp 18   Ht 5' 8 (1.727 m)   Wt 63.5 kg   SpO2 100%   BMI 21.29 kg/m   Physical Exam Vitals and nursing note reviewed.  Constitutional:      General: She is not in acute distress.    Appearance: Normal appearance.  HENT:     Head: Normocephalic and atraumatic.  Eyes:     General: Lids are normal. Vision grossly intact. No visual field deficit.    Extraocular Movements:     Right eye: Normal extraocular motion and no nystagmus.     Left eye: Normal extraocular motion and no nystagmus.     Conjunctiva/sclera: Conjunctivae normal.  Cardiovascular:     Rate and Rhythm: Normal rate.  Pulmonary:     Effort: Pulmonary effort is normal. No respiratory distress.     Breath sounds: Normal breath sounds.     Comments: No physical signs of respiratory distress.  Maintaining oxygen saturation without supplementation nor tachycardia.  Musculoskeletal:     Cervical back: Normal range of motion and neck supple. No rigidity.     Right lower leg: Edema present.     Left lower leg: Edema present.     Comments: TTP of posterior calf or thigh bilaterally.  1+ swelling that is nonpitting.  No signs of infection, erythema, warmth, pallor.  DP 2+ equally.  Sensation 2/2 BLE.  Skin:    General: Skin is warm.     Capillary Refill: Capillary refill takes less than 2 seconds.     Coloration: Skin is not jaundiced or pale.  Neurological:     Mental Status: She is alert and oriented to person, place, and time. Mental  status is at baseline.     GCS: GCS eye subscore is 4. GCS verbal subscore is 5. GCS motor subscore is 6.     Cranial Nerves: No cranial nerve deficit, dysarthria or facial asymmetry.     Sensory: Sensation is intact. No sensory deficit.     Motor: No weakness, tremor, atrophy, abnormal muscle tone, seizure activity or pronator drift.     Coordination: Coordination normal. Finger-Nose-Finger Test and Heel to [place] Test normal.     Gait: Gait is intact.     (all labs ordered are listed, but only abnormal results are displayed) Labs Reviewed  BASIC METABOLIC PANEL WITH GFR - Abnormal; Notable for the following components:      Result Value   Glucose, Bld 102 (*)    All other components within normal limits  CBC - Abnormal; Notable for the following components:   RBC 3.39 (*)    Hemoglobin 10.5 (*)    HCT 30.6 (*)    All other components within normal limits  PRO BRAIN NATRIURETIC PEPTIDE    EKG: None  Radiology: US  Venous Img Lower Bilateral Result Date: 03/17/2024 EXAM: ULTRASOUND DUPLEX OF THE BILATERAL LOWER EXTREMITY VEINS TECHNIQUE: Duplex ultrasound using B-mode/gray scaled imaging and Doppler spectral analysis and color flow was obtained of the deep venous structures of the bilateral lower extremity. COMPARISON: None. CLINICAL HISTORY: Bilateral lower extremity pain and swelling, r/o DVT. FINDINGS: The visualized veins of the lower extremity are patent and free of echogenic thrombus. The veins demonstrate good compressibility with normal color flow study and spectral analysis. IMPRESSION: 1. No evidence of DVT. Electronically signed by: Norman Gatlin MD 03/17/2024 10:41 PM EDT RP Workstation: [id number]     Medications Ordered in the ED  prochlorperazine  (COMPAZINE ) injection 10 mg (has no administration in time range)  diphenhydrAMINE  (BENADRYL ) injection 25 mg (has no administration in time range)      {Click here for ABCD2, HEART and other calculators REFRESH Note before  signing:1}                              Medical Decision Making Amount and/or Complexity of Data Reviewed Labs: ordered.  Risk Prescription drug management.   Patient presents to the ED for concern of bilateral lower extremity edema, this involves an extensive number of treatment options, and is a complaint that carries with it a high risk of complications and morbidity.  The differential diagnosis includes DVT, fluid overload   Co morbidities that complicate the patient evaluation  See hpi   Additional history obtained:  Additional history obtained from Nursing and Outside Medical Records   External records from outside source obtained and reviewed including     Lab Tests:  I Ordered, and  personally interpreted labs.  The pertinent results include:   BMP WNL Hgb 10.5   Imaging Studies ordered:  I ordered imaging studies including DVT US   I independently visualized and interpreted imaging which showed  No DVT bilaterally I agree with the radiologist interpretation     Problem List / ED Course:  BLE swelling DVT US  negative for DVT BNP WNL. No complaints of CP, shob. Low suspicion for fluid overload Anemia Denies hemoptysis, melena, rectal bleeding, vaginal bleeding or current menses,    Reevaluation:  After the interventions noted above, I reevaluated the patient and found that they have :stayed the same   Social Determinants of Health:  No PCP on file-provider recommendation Current tobacco abuse, polysubstance abuse   Dispostion:  After consideration of the diagnostic results and the patients response to treatment, I feel that the patent would benefit from outpatient management with PCP follow-up.   Discussed ED workup, disposition, return to ED precautions with patient who expresses understanding agrees with plan.  All questions answered to their satisfaction.  They are agreeable to plan.  Discharge instructions provided on paperwork  Final  diagnoses:  Peripheral edema    ED Discharge Orders     None

## 2024-03-17 NOTE — Discharge Instructions (Addendum)
 Thank you for letting us  evaluate you. Your US  was negative for blood clots in your legs. Make sure to wear compression socks and keep them elevated above your heart. Your blood count was low so make sure to follow up with PCP. Eat high protein and iron foods  Return to ED for dizziness, lightheadedness, loss of consciousness, red hot worsening swelling legs, chest pain, and shortness of breath, headache that comes on at maximum intensity suddenly/worst headache of life

## 2024-03-17 NOTE — ED Triage Notes (Signed)
 Pt reports bilateral lower pain and edema x 3d, denies injury, pt dozing off in triage

## 2024-03-17 NOTE — ED Notes (Signed)
 Patient is being discharged from the Urgent Care and sent to the Emergency Department via POV . Per Myla Bold, NP, patient is in need of higher level of care. Patient is aware and verbalizes understanding of plan of care.  Vitals:   03/17/24 1639  BP: 109/73  Pulse: 92  Resp: 16  Temp: 98.3 F (36.8 C)  SpO2: 96%

## 2024-03-17 NOTE — ED Provider Notes (Signed)
 [facility] Provider Note   CSN: [id number] Arrival date & time: 03/17/24  8048     Patient presents with: Leg Swelling and Leg Pain   Stacey Rodriguez is a 28 y.o. female with past medical history of substance abuse, sickle cell trait who presents to emergency department for evaluation of bilateral lower extremity pain and edema for the past 3 days.  Denies known injury, recent travel, OCP, history of blood clot, chest pain, no shortness of breath  Was recently evaluated in urgent care for vaginal itching and discharge and BLE swelling and recommended for ED evaluation for further workup of pedal edema  {Add pertinent medical, surgical, social history, OB history to HPI:32947}  Leg Pain      Prior to Admission medications   Medication Sig Start Date End Date Taking? Authorizing Provider  etonogestrel  (NEXPLANON ) 68 MG IMPL implant 1 each by Subdermal route once.    [provider]  fluconazole  (DIFLUCAN ) 150 MG tablet Take 1 tablet (150 mg total) by mouth daily for 2 doses. Take 1 tablet today and you may repeat in 3 days if your symptoms persist 03/17/24 03/19/24  Mayer, Jodi R, NP  ibuprofen  (ADVIL ) 800 MG tablet Take 1 tablet (800 mg total) by mouth 3 (three) times daily. 07/03/23   Rodriguez-Southworth, Sylvia, PA-C  methocarbamol  (ROBAXIN ) 500 MG tablet Take 1 tablet (500 mg total) by mouth 2 (two) times daily. 03/12/24   Donnajean Lynwood DEL, PA-C  ondansetron  (ZOFRAN ) 4 MG tablet Take 1 tablet (4 mg total) by mouth every 6 (six) hours. 02/03/24   Horton, Roxie HERO, DO  potassium chloride  SA (KLOR-CON  M) 20 MEQ tablet Take 1 tablet (20 mEq total) by mouth 2 (two) times daily. 03/12/24   Donnajean Lynwood DEL, PA-C  albuterol  (PROVENTIL  HFA;VENTOLIN  HFA) 108 (90 Base) MCG/ACT inhaler Inhale 1-2 puffs into the lungs every 6 (six) hours as needed for wheezing or shortness of breath. Patient not taking: Reported on 01/29/2020 12/13/17  05/19/20  Mario Million, MD    Allergies: Patient has no known allergies.    Review of Systems  Musculoskeletal:  Positive for joint swelling.    Updated Vital Signs BP 111/78 (BP Location: Right Arm)   Pulse 79   Temp 98.5 F (36.9 C)   Resp 18   Ht 5' 8 (1.727 m)   Wt 63.5 kg   SpO2 100%   BMI 21.29 kg/m   Physical Exam Vitals and nursing note reviewed.  Constitutional:      General: She is not in acute distress.    Appearance: Normal appearance.  HENT:     Head: Normocephalic and atraumatic.  Eyes:     Conjunctiva/sclera: Conjunctivae normal.  Cardiovascular:     Rate and Rhythm: Normal rate.  Pulmonary:     Effort: Pulmonary effort is normal. No respiratory distress.     Breath sounds: Normal breath sounds.     Comments: No physical signs of respiratory distress.  Maintaining oxygen saturation without supplementation nor tachycardia. Musculoskeletal:     Right lower leg: Edema present.     Left lower leg: Edema present.     Comments: TTP of posterior calf or thigh bilaterally.  1+ swelling that is nonpitting.  No signs of infection, erythema, warmth, pallor.  DP 2+ equally.  Sensation 2/2 BLE.  Skin:    General: Skin is warm.     Capillary Refill: Capillary refill takes less than 2 seconds.  Coloration: Skin is not jaundiced or pale.  Neurological:     Mental Status: She is alert and oriented to person, place, and time. Mental status is at baseline.     (all labs ordered are listed, but only abnormal results are displayed) Labs Reviewed  BASIC METABOLIC PANEL WITH GFR - Abnormal; Notable for the following components:      Result Value   Glucose, Bld 102 (*)    All other components within normal limits  CBC - Abnormal; Notable for the following components:   RBC 3.39 (*)    Hemoglobin 10.5 (*)    HCT 30.6 (*)    All other components within normal limits  PRO BRAIN NATRIURETIC PEPTIDE    EKG: None  Radiology: US  Venous Img Lower  Bilateral Result Date: 03/17/2024 EXAM: ULTRASOUND DUPLEX OF THE BILATERAL LOWER EXTREMITY VEINS TECHNIQUE: Duplex ultrasound using B-mode/gray scaled imaging and Doppler spectral analysis and color flow was obtained of the deep venous structures of the bilateral lower extremity. COMPARISON: None. CLINICAL HISTORY: Bilateral lower extremity pain and swelling, r/o DVT. FINDINGS: The visualized veins of the lower extremity are patent and free of echogenic thrombus. The veins demonstrate good compressibility with normal color flow study and spectral analysis. IMPRESSION: 1. No evidence of DVT. Electronically signed by: Norman Gatlin MD 03/17/2024 10:41 PM EDT RP Workstation: [id number]     Medications Ordered in the ED - No data to display    {Click here for ABCD2, HEART and other calculators REFRESH Note before signing:1}                              Medical Decision Making Amount and/or Complexity of Data Reviewed Labs: ordered.   Patient presents to the ED for concern of bilateral lower extremity edema, this involves an extensive number of treatment options, and is a complaint that carries with it a high risk of complications and morbidity.  The differential diagnosis includes DVT, fluid overload   Co morbidities that complicate the patient evaluation  ***   Additional history obtained:  Additional history obtained from Nursing and Outside Medical Records   External records from outside source obtained and reviewed including     Lab Tests:  I Ordered, and personally interpreted labs.  The pertinent results include:   BMP WNL Hgb 10.5   Imaging Studies ordered:  I ordered imaging studies including DVT US   I independently visualized and interpreted imaging which showed  No DVT bilaterally I agree with the radiologist interpretation     Problem List / ED Course:  BLE swelling DVT US  negative for DVT BNP WNL. No complaints of CP, shob. Low suspicion for fluid  overload   Reevaluation:  After the interventions noted above, I reevaluated the patient and found that they have :stayed the same   Social Determinants of Health:  No PCP on file-provider recommendation Current tobacco abuse, polysubstance abuse   Dispostion:  After consideration of the diagnostic results and the patients response to treatment, I feel that the patent would benefit from outpatient management with PCP follow-up.   Discussed ED workup, disposition, return to ED precautions with patient who expresses understanding agrees with plan.  All questions answered to their satisfaction.  They are agreeable to plan.  Discharge instructions provided on paperwork  Final diagnoses:  None    ED Discharge Orders     None

## 2024-03-17 NOTE — Discharge Instructions (Addendum)
 My clinical contact you with results of the vaginal swab done today if positive.  You may start Diflucan  as prescribed.  Please go to the emergency room for further evaluation of your lower leg swelling.

## 2024-03-17 NOTE — ED Provider Notes (Signed)
 [facility]    CSN: [id number] Arrival date & time: 03/17/24  1631      History   Chief Complaint Chief Complaint  Patient presents with   Leg Swelling    HPI Stacey Rodriguez is a 28 y.o. female with a past medical history of eczema, substance abuse presents for lower extremity swelling and vaginal itching.  Patient reports 3 to 4 days of a malodorous pruritic discharge.  Denies any dysuria, fevers, nausea/vomiting, flank pain.  No STD exposure or concern.  Has a history of yeast infections and states this is consistent with that.  In addition she reports 3 days of bilateral lower extremity swelling that does not improve with elevation.  States it is worsening and causing her pain when she stands.  She denies any chest pain, shortness of breath, orthopnea, calf pain/redness.  Denies any change in diet or new medications.  Denies any history of cardiac or kidney function issues.  Denies history of PVD or PAD.  She does report she has been extremely fatigued since the symptoms started as well.  No other concerns at this time  HPI  Past Medical History:  Diagnosis Date   Genital herpes    Hx of chlamydia infection 2017   Hx of trichomoniasis 2017    Patient Active Problem List   Diagnosis Date Noted   Overdose 09/23/2023   Methadone overdose, accidental or unintentional, initial encounter (HCC) 09/23/2023   [redacted] weeks gestation of pregnancy 08/21/2019   Indication for care in labor and delivery, antepartum 08/20/2019   GBS bacteriuria 07/25/2019   GBS (group B Streptococcus carrier), +RV culture, currently pregnant 07/22/2019   Alpha thalassemia silent carrier 05/15/2019   Sickle cell trait (HCC) 05/15/2019   Genital herpes affecting pregnancy 05/15/2019   Supervision of other normal pregnancy, antepartum 01/27/2019   Substance abuse (HCC) 11/11/2015   Adjustment disorder with mixed anxiety and depressed mood 11/11/2015   ECZEMA 05/27/2008    Past Surgical  History:  Procedure Laterality Date   CESAREAN SECTION N/A 08/21/2019   Procedure: CESAREAN SECTION;  Surgeon: Alger Gong, MD;  Location: [facility] LD ORS;  Service: Obstetrics;  Laterality: N/A;   NO PAST SURGERIES      OB History     Gravida  1   Para  1   Term  1   Preterm      AB      Living  1      SAB      IAB      Ectopic      Multiple  0   Live Births  1            Home Medications    Prior to Admission medications   Medication Sig Start Date End Date Taking? Authorizing Provider  fluconazole  (DIFLUCAN ) 150 MG tablet Take 1 tablet (150 mg total) by mouth daily for 2 doses. Take 1 tablet today and you may repeat in 3 days if your symptoms persist 03/17/24 03/19/24 Yes Loreda Myla SAUNDERS, NP  etonogestrel  (NEXPLANON ) 68 MG IMPL implant 1 each by Subdermal route once.    [provider]  ibuprofen  (ADVIL ) 800 MG tablet Take 1 tablet (800 mg total) by mouth 3 (three) times daily. 07/03/23   Rodriguez-Southworth, Sylvia, PA-C  methocarbamol  (ROBAXIN ) 500 MG tablet Take 1 tablet (500 mg total) by mouth 2 (two) times daily. 03/12/24   Donnajean Lynwood DEL, PA-C  ondansetron  (ZOFRAN ) 4 MG tablet Take 1 tablet (4 mg  total) by mouth every 6 (six) hours. 02/03/24   Horton, Roxie HERO, DO  potassium chloride  SA (KLOR-CON  M) 20 MEQ tablet Take 1 tablet (20 mEq total) by mouth 2 (two) times daily. 03/12/24   Donnajean Lynwood DEL, PA-C  albuterol  (PROVENTIL  HFA;VENTOLIN  HFA) 108 (90 Base) MCG/ACT inhaler Inhale 1-2 puffs into the lungs every 6 (six) hours as needed for wheezing or shortness of breath. Patient not taking: Reported on 01/29/2020 12/13/17 05/19/20  Mario Million, MD    Family History Family History  Problem Relation Age of Onset   Asthma Mother    Healthy Father     Social History Social History   Tobacco Use   Smoking status: Every Day    Types: Cigars   Smokeless tobacco: Never  Vaping Use   Vaping status: Never Used  Substance Use Topics   Alcohol  use: Not Currently    Comment: ocassionally   Drug use: Yes    Types: Marijuana    Comment: Percocet abuse     Allergies   Patient has no known allergies.   Review of Systems Review of Systems  Genitourinary:        Vaginal itching  Skin:        Bilateral lower extremity swelling     Physical Exam Triage Vital Signs ED Triage Vitals  Encounter Vitals Group     BP 03/17/24 1639 109/73     Girls Systolic BP Percentile --      Girls Diastolic BP Percentile --      Boys Systolic BP Percentile --      Boys Diastolic BP Percentile --      Pulse Rate 03/17/24 1639 92     Resp 03/17/24 1639 16     Temp 03/17/24 1639 98.3 F (36.8 C)     Temp Source 03/17/24 1639 Oral     SpO2 03/17/24 1639 96 %     Weight --      Height --      Head Circumference --      Peak Flow --      Pain Score 03/17/24 1638 7     Pain Loc --      Pain Education --      Exclude from Growth Chart --    No data found.  Updated Vital Signs BP 109/73   Pulse 92   Temp 98.3 F (36.8 C) (Oral)   Resp 16   SpO2 96%   Visual Acuity Right Eye Distance:   Left Eye Distance:   Bilateral Distance:    Right Eye Near:   Left Eye Near:    Bilateral Near:     Physical Exam Vitals and nursing note reviewed.  Constitutional:      General: She is not in acute distress.    Appearance: Normal appearance. She is not ill-appearing.     Comments: Patient appears very fatigued  HENT:     Head: Normocephalic and atraumatic.  Cardiovascular:     Rate and Rhythm: Normal rate and regular rhythm.     Heart sounds: Normal heart sounds.  Pulmonary:     Effort: Pulmonary effort is normal.     Breath sounds: Normal breath sounds.  Musculoskeletal:     Right lower leg: Swelling present. 2+ Edema present.     Left lower leg: Swelling present. 2+ Edema present.     Comments: Pedal pulses +2 bilaterally.  No tenderness with palpation to posterior calf bilaterally.  Skin:  General: Skin is warm and dry.   Neurological:     General: No focal deficit present.     Mental Status: She is alert and oriented to person, place, and time.  Psychiatric:        Mood and Affect: Mood normal.        Behavior: Behavior normal.      UC Treatments / Results  Labs (all labs ordered are listed, but only abnormal results are displayed) Labs Reviewed  CERVICOVAGINAL ANCILLARY ONLY    EKG   Radiology No results found.  Procedures Procedures (including critical care time)  Medications Ordered in UC Medications - No data to display  Initial Impression / Assessment and Plan / UC Course  I have reviewed the triage vital signs and the nursing notes.  Pertinent labs & imaging results that were available during my care of the patient were reviewed by me and considered in my medical decision making (see chart for details).     Reviewed exam and symptoms with patient.  Vaginal swab is ordered we will contact for any positive results.  Given symptoms we will treat for likely yeast infection with Diflucan .  Discussed her lower extremity swelling and multiple causes including cardiac, vascular, etc.  She reports her symptoms are worsening and do not improve with elevation advised to go to the ER for further evaluation.  She is in agreement with plan will go POV to the emergency room Final Clinical Impressions(s) / UC Diagnoses   Final diagnoses:  Vaginal itching     Discharge Instructions      My clinical contact you with results of the vaginal swab done today if positive.  You may start Diflucan  as prescribed.  Please go to the emergency room for further evaluation of your lower leg swelling.    ED Prescriptions     Medication Sig Dispense Auth. Provider   fluconazole  (DIFLUCAN ) 150 MG tablet Take 1 tablet (150 mg total) by mouth daily for 2 doses. Take 1 tablet today and you may repeat in 3 days if your symptoms persist 2 tablet Shirleyann Montero, Jodi R, NP      PDMP not reviewed this encounter.    Loreda Myla SAUNDERS, NP 03/17/24 [phone]

## 2024-03-18 LAB — CERVICOVAGINAL ANCILLARY ONLY
Bacterial Vaginitis (gardnerella): POSITIVE — AB
Candida Glabrata: NEGATIVE
Candida Vaginitis: POSITIVE — AB
Comment: NEGATIVE
Comment: NEGATIVE
Comment: NEGATIVE

## 2024-03-18 LAB — OCCULT BLOOD X 1 CARD TO LAB, STOOL: Fecal Occult Bld: NEGATIVE

## 2024-03-18 MED ORDER — ACETAMINOPHEN 325 MG PO TABS
650.0000 mg | ORAL_TABLET | Freq: Once | ORAL | Status: AC
  Administered 2024-03-18: 650 mg via ORAL
  Filled 2024-03-18: qty 2

## 2024-03-18 NOTE — ED Notes (Signed)
 AVS provided by edp was reviewed with pt. Pt verbalized understanding with no additional questions at this time.

## 2024-03-19 ENCOUNTER — Ambulatory Visit (HOSPITAL_COMMUNITY): Payer: Self-pay

## 2024-03-19 MED ORDER — METRONIDAZOLE 500 MG PO TABS: 500.0000 mg | ORAL_TABLET | Freq: Two times a day (BID) | ORAL | 0 refills | Status: AC

## 2024-09-06 ENCOUNTER — Ambulatory Visit: Payer: Self-pay
